# Patient Record
Sex: Male | Born: 1940 | Race: White | Hispanic: No | Smoking: Former smoker
Health system: Southern US, Community
[De-identification: ages and names within clinical notes are randomized; demographics above are authoritative.]

## PROBLEM LIST (undated history)

## (undated) DIAGNOSIS — H269 Unspecified cataract: Secondary | ICD-10-CM

## (undated) DIAGNOSIS — I4891 Unspecified atrial fibrillation: Secondary | ICD-10-CM

## (undated) DIAGNOSIS — M1990 Unspecified osteoarthritis, unspecified site: Secondary | ICD-10-CM

## (undated) DIAGNOSIS — F419 Anxiety disorder, unspecified: Secondary | ICD-10-CM

## (undated) DIAGNOSIS — J449 Chronic obstructive pulmonary disease, unspecified: Secondary | ICD-10-CM

## (undated) DIAGNOSIS — J45909 Unspecified asthma, uncomplicated: Secondary | ICD-10-CM

## (undated) DIAGNOSIS — Z8709 Personal history of other diseases of the respiratory system: Secondary | ICD-10-CM

## (undated) DIAGNOSIS — J302 Other seasonal allergic rhinitis: Secondary | ICD-10-CM

## (undated) DIAGNOSIS — I509 Heart failure, unspecified: Secondary | ICD-10-CM

## (undated) DIAGNOSIS — Z8719 Personal history of other diseases of the digestive system: Secondary | ICD-10-CM

## (undated) DIAGNOSIS — Z972 Presence of dental prosthetic device (complete) (partial): Secondary | ICD-10-CM

## (undated) DIAGNOSIS — E785 Hyperlipidemia, unspecified: Secondary | ICD-10-CM

## (undated) DIAGNOSIS — I10 Essential (primary) hypertension: Secondary | ICD-10-CM

## (undated) DIAGNOSIS — E039 Hypothyroidism, unspecified: Secondary | ICD-10-CM

## (undated) DIAGNOSIS — Z8711 Personal history of peptic ulcer disease: Secondary | ICD-10-CM

## (undated) HISTORY — DX: Hyperlipidemia, unspecified: E78.5

## (undated) HISTORY — PX: CARDIAC CATHETERIZATION: SHX172

## (undated) HISTORY — DX: Other seasonal allergic rhinitis: J30.2

## (undated) HISTORY — DX: Chronic obstructive pulmonary disease, unspecified: J44.9

## (undated) HISTORY — PX: CHOLECYSTECTOMY: SHX55

## (undated) HISTORY — PX: COLONOSCOPY: SHX174

## (undated) HISTORY — PX: JOINT REPLACEMENT: SHX530

## (undated) HISTORY — PX: ESOPHAGOGASTRODUODENOSCOPY: SHX1529

## (undated) HISTORY — PX: HERNIA REPAIR: SHX51

---

## 2005-10-26 ENCOUNTER — Ambulatory Visit (HOSPITAL_COMMUNITY): Admission: RE | Admit: 2005-10-26 | Discharge: 2005-10-26 | Payer: Self-pay | Admitting: Cardiology

## 2006-11-12 ENCOUNTER — Emergency Department: Payer: Self-pay | Admitting: Emergency Medicine

## 2006-11-12 ENCOUNTER — Other Ambulatory Visit: Payer: Self-pay

## 2006-12-24 ENCOUNTER — Ambulatory Visit: Payer: Self-pay | Admitting: Family Medicine

## 2006-12-24 ENCOUNTER — Inpatient Hospital Stay (HOSPITAL_COMMUNITY): Admission: EM | Admit: 2006-12-24 | Discharge: 2006-12-25 | Payer: Self-pay | Admitting: Emergency Medicine

## 2007-01-16 ENCOUNTER — Ambulatory Visit: Payer: Self-pay | Admitting: Family Medicine

## 2007-02-11 ENCOUNTER — Other Ambulatory Visit: Payer: Self-pay

## 2007-02-12 ENCOUNTER — Inpatient Hospital Stay: Payer: Self-pay | Admitting: Internal Medicine

## 2007-03-10 ENCOUNTER — Other Ambulatory Visit: Payer: Self-pay

## 2007-03-11 ENCOUNTER — Inpatient Hospital Stay: Payer: Self-pay | Admitting: Internal Medicine

## 2007-04-27 ENCOUNTER — Other Ambulatory Visit: Payer: Self-pay

## 2007-04-28 ENCOUNTER — Observation Stay: Payer: Self-pay | Admitting: Internal Medicine

## 2007-05-06 ENCOUNTER — Inpatient Hospital Stay (HOSPITAL_COMMUNITY): Admission: RE | Admit: 2007-05-06 | Discharge: 2007-05-09 | Payer: Self-pay | Admitting: Orthopedic Surgery

## 2007-05-09 ENCOUNTER — Encounter: Payer: Self-pay | Admitting: Internal Medicine

## 2007-10-21 ENCOUNTER — Inpatient Hospital Stay (HOSPITAL_COMMUNITY): Admission: RE | Admit: 2007-10-21 | Discharge: 2007-10-24 | Payer: Self-pay | Admitting: Orthopedic Surgery

## 2007-10-22 ENCOUNTER — Ambulatory Visit: Payer: Self-pay | Admitting: Surgery

## 2007-10-22 ENCOUNTER — Encounter (INDEPENDENT_AMBULATORY_CARE_PROVIDER_SITE_OTHER): Payer: Self-pay | Admitting: Orthopedic Surgery

## 2010-01-20 ENCOUNTER — Encounter (INDEPENDENT_AMBULATORY_CARE_PROVIDER_SITE_OTHER): Payer: Self-pay | Admitting: *Deleted

## 2010-01-24 ENCOUNTER — Telehealth: Payer: Self-pay | Admitting: Gastroenterology

## 2010-02-14 ENCOUNTER — Encounter (INDEPENDENT_AMBULATORY_CARE_PROVIDER_SITE_OTHER): Payer: Self-pay | Admitting: *Deleted

## 2010-02-16 ENCOUNTER — Ambulatory Visit: Payer: Self-pay | Admitting: Gastroenterology

## 2010-03-03 ENCOUNTER — Ambulatory Visit: Payer: Self-pay | Admitting: Gastroenterology

## 2010-05-22 ENCOUNTER — Encounter: Payer: Self-pay | Admitting: Orthopedic Surgery

## 2010-05-31 NOTE — Letter (Signed)
Summary: [facility] Instructions  [facility]  [address] [place], [place] [address]   Phone: [phone]  Fax: [phone]       JOSHA WEEKLEY    March 27, 1941    MRN: [id number]        Procedure Day /Date:  Thursday 03/03/2010     Arrival Time: 9:00 am      Procedure Time: 10:00 am     Location of Procedure:                    _x _  [facility] (4th Floor)                        PREPARATION FOR COLONOSCOPY WITH MOVIPREP   Starting 5 days prior to your procedure Saturday 10/29 do not eat nuts, seeds, popcorn, corn, beans, peas,  salads, or any raw vegetables.  Do not take any fiber supplements (e.g. Metamucil, Citrucel, and Benefiber).  THE DAY BEFORE YOUR PROCEDURE         DATE: Wednesday 11/2  1.  Drink clear liquids the entire day-NO SOLID FOOD  2.  Do not drink anything colored red or purple.  Avoid juices with pulp.  No orange juice.  3.  Drink at least 64 oz. (8 glasses) of fluid/clear liquids during the day to prevent dehydration and help the prep work efficiently.  CLEAR LIQUIDS INCLUDE: Water Jello Ice Popsicles Tea (sugar ok, no milk/cream) Powdered fruit flavored drinks Coffee (sugar ok, no milk/cream) Gatorade Juice: apple, white grape, white cranberry  Lemonade Clear bullion, consomm, broth Carbonated beverages (any kind) Strained chicken noodle soup Hard Candy                             4.  In the morning, mix first dose of MoviPrep solution:    Empty 1 Pouch A and 1 Pouch B into the disposable container    Add lukewarm drinking water to the top line of the container. Mix to dissolve    Refrigerate (mixed solution should be used within 24 hrs)  5.  Begin drinking the prep at 5:00 p.m. The MoviPrep container is divided by 4 marks.   Every 15 minutes drink the solution down to the next mark (approximately 8 oz) until the full liter is complete.   6.  Follow completed prep with 16 oz of clear liquid of your choice  (Nothing red or purple).  Continue to drink clear liquids until bedtime.  7.  Before going to bed, mix second dose of MoviPrep solution:    Empty 1 Pouch A and 1 Pouch B into the disposable container    Add lukewarm drinking water to the top line of the container. Mix to dissolve    Refrigerate  THE DAY OF YOUR PROCEDURE      DATE: Thursday 11/3  Beginning at 5:00 a.m. (5 hours before procedure):         1. Every 15 minutes, drink the solution down to the next mark (approx 8 oz) until the full liter is complete.  2. Follow completed prep with 16 oz. of clear liquid of your choice.    3. You may drink clear liquids until 8:00 am (2 HOURS BEFORE PROCEDURE).   MEDICATION INSTRUCTIONS  Unless otherwise instructed, you should take regular prescription medications with a small sip of water   as early as possible the morning of  your procedure.           OTHER INSTRUCTIONS  You will need a responsible adult at least 68 years of age to accompany you and drive you home.   This person must remain in the waiting room during your procedure.  Wear loose fitting clothing that is easily removed.  Leave jewelry and other valuables at home.  However, you may wish to bring a book to read or  an iPod/MP3 player to listen to music as you wait for your procedure to start.  Remove all body piercing jewelry and leave at home.  Total time from sign-in until discharge is approximately 2-3 hours.  You should go home directly after your procedure and rest.  You can resume normal activities the  day after your procedure.  The day of your procedure you should not:   Drive   Make legal decisions   Operate machinery   Drink alcohol   Return to work  You will receive specific instructions about eating, activities and medications before you leave.    The above instructions have been reviewed and explained to me by   Clide Cliff, RN______________________    I fully understand  and can verbalize these instructions _____________________________ Date _________

## 2010-05-31 NOTE — Miscellaneous (Signed)
Summary: direct colon--ch./previsit  Clinical Lists Changes  Medications: Added new medication of MOVIPREP 100 GM  SOLR (PEG-KCL-NACL-NASULF-NA ASC-C) As directed - Signed Rx of MOVIPREP 100 GM  SOLR (PEG-KCL-NACL-NASULF-NA ASC-C) As directed;  #1 x 0;  Signed;  Entered by: Clide Cliff RN;  Authorized by: Louis Meckel MD;  Method used: Electronically to [facility] [address], [place], [place]  [address], Ph: [phone], Fax: [phone] Observations: Added new observation of ALLERGY REV: Done (02/16/2010 13:40)    Prescriptions: MOVIPREP 100 GM  SOLR (PEG-KCL-NACL-NASULF-NA ASC-C) As directed  #1 x 0   Entered by:   Clide Cliff RN   Authorized by:   Louis Meckel MD   Signed by:   Clide Cliff RN on 02/16/2010   Method used:   Electronically to        [facility] [address]* (retail)       [address]       [place], [place]  [address]       Ph: [phone]       Fax: [phone]   RxID:   [id number]

## 2010-05-31 NOTE — Letter (Signed)
Summary: Pre Visit Letter Revised  [facility]  [address] [place], [place] [address]   Phone: [phone]  Fax: [phone]        01/20/2010 MRN: [id number] [name] [address] [place], [place]  [address]             Procedure Date:  03-03-10   Welcome to the Gastroenterology Division at [facility].    You are scheduled to see a nurse for your pre-procedure visit on 02-16-10 at 2:00p.m. on the 3rd floor at [facility], [address]  We ask that you try to arrive at our office 15 minutes prior to your appointment time to allow for check-in.  Please take a minute to review the attached form.  If you answer "Yes" to one or more of the questions on the first page, we ask that you call the person listed at your earliest opportunity.  If you answer "No" to all of the questions, please complete the rest of the form and bring it to your appointment.    Your nurse visit will consist of discussing your medical and surgical history, your immediate family medical history, and your medications.   If you are unable to list all of your medications on the form, please bring the medication bottles to your appointment and we will list them.  We will need to be aware of both prescribed and over the counter drugs.  We will need to know exact dosage information as well.    Please be prepared to read and sign documents such as consent forms, a financial agreement, and acknowledgement forms.  If necessary, and with your consent, a friend or relative is welcome to sit-in on the nurse visit with you.  Please bring your insurance card so that we may make a copy of it.  If your insurance requires a referral to see a specialist, please bring your referral form from your primary care physician.  No co-pay is required for this nurse visit.     If you cannot keep your appointment, please call [phone] to cancel or reschedule prior to your appointment date.  This allows  [place] the opportunity to schedule an appointment for another patient in need of care.    Thank you for choosing  Gastroenterology for your medical needs.  We appreciate the opportunity to care for you.  Please visit [place] at our website  to learn more about our practice.  Sincerely, The Gastroenterology Division

## 2010-05-31 NOTE — Progress Notes (Signed)
Summary: Previous colon 4 years ago Direct referral form  Phone Note Call from Patient Call back at Work Phone [phone]   Caller: Patient Call For: Dr. Arlyce Dice Reason for Call: Talk to Nurse Summary of Call: has procedure questions Initial call taken by: Vallarie Mare,  January 24, 2010 3:09 PM  Follow-up for Phone Call        Pt called explaining that he has had a colonoscopy 4 years ago; he had 2 polyps that were benign. Explained to pt that we have to review his old colonoscopy. Pt states he will bring [place] a copy as soon as he finds them. Lennie Peters,MD was the provider. Follow-up by: Merri Ray CMA Duncan Dull),  January 24, 2010 3:27 PM  Additional Follow-up for Phone Call Additional follow up Details #1::        Tried to contact pt again about previous colonoscopy report Additional Follow-up by: Merri Ray CMA Duncan Dull),  February 01, 2010 10:30 AM    Additional Follow-up for Phone Call Additional follow up Details #2::    Dr Arlyce Dice, I have been waiting on pt to bring in a copy of his old colon report from 4 years ago. He has not brought it in yet. Can we still go ahead and leave pt as scheduled for his pre-visit and colonoscopy which is scheduled o for november?? Follow-up by: Merri Ray CMA Duncan Dull),  February 03, 2010 10:29 AM  Additional Follow-up for Phone Call Additional follow up Details #3:: Details for Additional Follow-up Action Taken: yes    Called pt to inform to continue with the scheduled pre-visit and colon dates. L/M for him to contact the office if he has any questions Additional Follow-up by: Louis Meckel MD,  February 03, 2010 11:59 AM   Appended Document: Previous colon 4 years ago Direct referral form Pt called he is aware to keep all appointments as scheduled, could not find his old reports

## 2010-05-31 NOTE — Procedures (Signed)
Summary: Colonoscopy  Patient: [name] Note: All result statuses are Final unless otherwise noted.  Tests: (1) Colonoscopy (COL)   COL Colonoscopy           DONE     [facility]     [address]     [place], [place]  [address]           COLONOSCOPY PROCEDURE REPORT           PATIENT:  Mark, Ellis  MR#:  [id number]     BIRTHDATE:  04/29/1941, 69 yrs. old  GENDER:  male           ENDOSCOPIST:  Barbette Hair. Arlyce Dice, MD     Referred by:  Rinaldo Cloud, M.D.           PROCEDURE DATE:  03/03/2010     PROCEDURE:  Diagnostic Colonoscopy     ASA CLASS:  Class II     INDICATIONS:  1) screening  2) history of pre-cancerous     (adenomatous) colon polyps Last colo 5 years ago - polyp           MEDICATIONS:   Fentanyl 75 mcg IV, Versed 6 mg IV           DESCRIPTION OF PROCEDURE:   After the risks benefits and     alternatives of the procedure were thoroughly explained, informed     consent was obtained.  Digital rectal exam was performed and     revealed no abnormalities.   The LB 180AL [id number] endoscope was     introduced through the anus and advanced to the cecum, which was     identified by both the appendix and ileocecal valve, without     limitations.  The quality of the prep was excellent, using     MoviPrep.  The instrument was then slowly withdrawn as the colon     was fully examined.     <<PROCEDUREIMAGES>>           FINDINGS:  Moderate diverticulosis was found in the sigmoid colon     (see image13).  This was otherwise a normal examination of the     colon (see image1, image2, image4, image6, image8, image14,     image15, and image16).   Retroflexed views in the rectum revealed     no abnormalities.    The time to cecum =  4.0  minutes. The scope     was then withdrawn (time =  6.50  min) from the patient and the     procedure completed.           COMPLICATIONS:  None           ENDOSCOPIC IMPRESSION:     1) Moderate diverticulosis in the sigmoid  colon     2) Otherwise normal examination     RECOMMENDATIONS:     1) Colonoscopy in 10 years           REPEAT EXAM:   10 year(s) Colonoscopy           ______________________________     Barbette Hair. Arlyce Dice, MD           CC:           n.     eSIGNED:   Barbette Hair. Kaplan at 03/03/2010 10:41 AM           Lillard Anes, [id number]  Note: An exclamation mark (!) indicates a result that was not  dispersed into the flowsheet. Document Creation Date: 03/03/2010 10:42 AM _______________________________________________________________________  (1) Order result status: Final Collection or observation date-time: 03/03/2010 10:33 Requested date-time:  Receipt date-time:  Reported date-time:  Referring Physician:   Ordering Physician: Melvia Heaps [phone]) Specimen Source:  Source: Launa Grill Order Number: [id number] Lab site:   Appended Document: Colonoscopy    Clinical Lists Changes  Observations: Added new observation of COLONNXTDUE: 03/2020 (03/03/2010 14:32)

## 2010-09-13 NOTE — H&P (Signed)
[name], ADAMEC             ACCOUNT NO.:  [id number]   MEDICAL RECORD NO.:  [id number]          PATIENT TYPE:  INP   LOCATION:  5731                         FACILITY:  [facility]   PHYSICIAN:  Pearlean Brownie, M.D.DATE OF BIRTH:  Mar 17, 1941   DATE OF ADMISSION:  12/23/2006  DATE OF DISCHARGE:                              HISTORY & PHYSICAL   CHIEF COMPLAINT:  Shortness of breath.   PRIMARY CARE PHYSICIAN:  A physician in [place].   HISTORY OF PRESENT ILLNESS:  This is a 70 year old male with shortness  of breath.  He was diagnosed with bronchitis about a year ago and  since then has had lung problems.  In the past he has been given Xopenex  and Spiriva after the initial diagnosis.  He does have a pulmonologist  who says his lungs are worn out and that he has bronchitis in the lower  one-third of his lungs.  He has also been given Advair and Levaquin and  Prednisone in July which helped temporarily.  He has also been given  Combivent in the past which he uses when he wants to.   Today he went to the physician for increased shortness of breath and was  started on Levaquin.  He had shortness of breath afterwards and at home.  He felt he could not catch his breath after being at home and wanted to  come in for further evaluation.  He complains of a dry cough over the  past number of months.  He complains of congestion.  He denies fever, he  denies chest pain.  He does not produce sputum with his cough.  He says  his blood work has been normal in the past.  The patient does feel  better after the use of inhalers.  However, he does not use these  inhalers as directed.  In the emergency room albuterol nebulizers have  helped.  The patient says he has not slept in two days because of  shortness of breath.  At the end of the interview, he mentions that he  has 2 liters of oxygen at home that he is supposed to use.  No other  complaints.   PAST MEDICAL HISTORY:  1. Hypertension.  2. Hyperlipidemia.  3. Anxiety.  4. Sleep apnea.  5. A catheterization in 2007 which showed an EF of 50% and global      hypokinesia; however, no coronary artery disease.  6. History of an upper GI bleeding ulcer.   MEDICATIONS:  1. Levaquin.  2. Xopenex every 4-6 hours.  3. Combivent.  4. CPAP which he is not using.  5. Paxil 10 mg daily.  6. Lisinopril 40 mg daily.  7. Crestor 10 mg daily.  8. Aspirin 81 mg.   ALLERGIES:  No known drug allergies.   PAST SURGICAL HISTORY:  1. Sinus surgeries, multiple.  2. Hernia surgery.  3. Cholecystectomy.  4. GI surgery for a bleeding ulcer.   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  He is not a smoker; however, has a  remote  smoking history of no much daily.  However, he does say he was around a  lot of smoke because he was in a band.  Denies alcohol.  Denies drug  use.   FAMILY HISTORY:  Father had MI at age 58.  There is no lung disease in  the family.  Mother had a stroke.  The patient lives with his wife.   REVIEW OF SYSTEMS:  See History of the Present Illness as well as denies  constipation, denies diarrhea.  Endorses weight loss of 5-7 pounds over  2-3 months.  Denies sweats.   PHYSICAL EXAMINATION:  VITAL SIGNS:  Temperature 97.9, heart rate 102-  106, blood pressure 157/96, O2 97% on room air, respiratory rate 20.  GENERAL APPEARANCE:  Sleeping, but arousable.  HEENT:  Pupils equal, round and reactive to light and accommodation.  Extraocular muscles are intact.  Dry mucous membranes with cracked lips.  No erythema of the throat.  NECK:  No lymphadenopathy.  CARDIOVASCULAR:  Tachycardic.  Regular rhythm.  No rubs, gallops or  murmurs.  PULMONARY:  Positive expiratory wheezes at the bases.  Fair air  movement.  No increased work of breathing.  The patient is able to  complete full sentences.  ABDOMEN:  Obese.  Positive bowel sounds.  Nontender.  SKIN:  No  rashes.  NEUROLOGIC:  Cranial nerves II-XII are intact.  Cerebellar function is  intact.  MUSCULOSKELETAL:  With 5/5 strength in the upper and lower extremities  bilaterally.  PSYCHIATRIC:  The patient continued to have pressured speech and it was  very difficult to obtain a history from this patient.  EXTREMITIES:  No edema.  Nontender, with 2+ pulses.   LABORATORY INVESTIGATIONS:  Sodium 135, potassium 3.8, chloride 98,  bicarbonate 28, BUN 7, creatinine 0.73, glucose 117, calcium 9.6.  White  blood cells 7, hemoglobin 14.4, hematocrit 42.2, platelets 259,000, ANC  4.3, neutrophil count 62%.  Point of cares are negative.  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   Chest x-ray:  COPD, no acute findings.  BNP is 33.   ASSESSMENT AND PLAN:  This is a 70 year old male with chronic  obstructive pulmonary disease.   1. Chronic obstructive pulmonary disease:  Mild exacerbation.  This      patient likely has moderate to severe COPD that has been      inadequately treated secondary to poor understanding by the patient      of treatment and diagnosis.  For now will start Avelox; however, we      may discontinue this as there is no productive sputum.  However,      the patient was started on Levaquin in the outpatient setting and      was told that he had bronchitis.  Prednisone daily.  Albuterol and      Atrovent nebulizers.  O2.  The patient has a pulmonologist who      seems to have been treating COPD.  We will hold off further work-up      at this point.  The patient states he has O2 at home but does not      always use it.  Will provide the patient with education while here.  2. Hypertension:  Home lisinopril.  3. Tachycardia:  Likely secondary to nebulizers.  Will follow.  4. Anxiety:  Paxil.  5. Hyperlipidemia:  Crestor.  6. Sleep apnea:  The patient states he does not use or want to  use      CPAP.  Will hold off for now.   DISPOSITION:  Pending  pulmonary improvement.      Johney Maine, M.D.  Electronically Signed      Pearlean Brownie, M.D.  Electronically Signed    JT/MEDQ  D:  12/24/2006  T:  12/24/2006  Job:  [id number]

## 2010-09-13 NOTE — Op Note (Signed)
[name], [name]             ACCOUNT NO.:  [id number]   MEDICAL RECORD NO.:  [id number]          PATIENT TYPE:  INP   LOCATION:  2550                         FACILITY:  [facility]   PHYSICIAN:  Mila Homer. Sherlean Foot, M.D. DATE OF BIRTH:  10/13/1940   DATE OF PROCEDURE:  05/06/2007  DATE OF DISCHARGE:                               OPERATIVE REPORT   SURGEON:  Mila Homer. Sherlean Foot, M.D.   ASSISTANT:  Arnoldo Morale, PA   ANESTHESIA:  General.   PREOPERATIVE DIAGNOSIS:  Right knee osteoarthritis.   POSTOPERATIVE DIAGNOSIS:  Right knee osteoarthritis.   PROCEDURE:  Right total knee arthroplasty.   INDICATIONS FOR PROCEDURE:  The patient is a 70 year old white male with  failure of conservative measures for osteoarthritis of the right knee.  Informed consent was obtained.   DESCRIPTION OF PROCEDURE:  The patient laid supine and administered  general anesthesia.  Foley catheter placed.  Right leg was prepped and  draped in sterile fashion.  The extremity was exsanguinated with the  Esmarch and tourniquet inflated 350 mmHg and set for an hour.  A midline  incision made a #10 blade.  New blade was used to make a median  parapatellar arthrotomy and perform synovectomy.  I elevated deep MCL  off the medial crest of the tibia and around to the semimembranosus  tendon.  This was a varus knee.  Then went into flexion, subluxed the  patella.  Measured the patella at 25 mm thick, reamed down 9 mm.  I used  35 mm template, drilled three lug holes and with prosthetic trial in  place recreated the 25-mm thickness.  I then used the extramedullary  alignment system on the tibia to make a perpendicular cut to the  anatomic axis of the tibia.  I then made an intramedullary drill on the  distal femur.  I placed intramedullary guide set on 6 degrees valgus  cut, made distal femoral cut with sagittal saw.  Then marked out the  epicondylar axis in Whiteside's line.  Used multi referencing technique  to  externally rotate the femur 3 degrees, sized to a size F.  I put a 4  in 1 cutting block into place made anterior-posterior chamfer cuts.  I  then placed a lamina spreader in the knee, removed the medial and  lateral menisci, posterior condylar osteophytes, ACL and PCL.  I then  placed 12 mm spacer block in the knee had good flexion/extension gap  balance.  Then finished the femur with a size F finishing block,  finished the tibia with a size 7 tibial tray, drill and keel.  Then  trialed with 7 tibia, F femur, 10 and 12 inserts, 35-mm patella, a 12-mm  insert gave full extension, drop and dangle back to 130 degrees.  Ligaments stable, patella tracked well.  I then removed the trial  components, copiously irrigated.  I then cemented in components and  removed excess cement and allowed the cement to harden in extension.  I  placed a Hemovac coming out deep to the arthrotomy and superolaterally.  Left a pain catheter coming out supermedial and superficial  to the  arthrotomy.  Once cement was hard, I let tourniquet down, obtained  hemostasis and copiously irrigated once again.  I then closed the  arthrotomy with figure-of-eight #1 Vicryl sutures, the deep soft tissues  with buried 0 Vicryl sutures, subcuticular 2-0 Vicryl stitch and skin  staples.  Dressed with Xeroform, dressing sponges, sterile Webril and  TED stocking.   COMPLICATIONS:  None.   DRAINS:  One Hemovac, one pain catheter.   ESTIMATED BLOOD LOSS:  300 mL.   TOURNIQUET TIME:  53 minutes.           ______________________________  Mila Homer Sherlean Foot, M.D.     SDL/MEDQ  D:  05/06/2007  T:  05/06/2007  Job:  [id number]

## 2010-09-13 NOTE — Op Note (Signed)
[name], [name]             ACCOUNT NO.:  [id number]   MEDICAL RECORD NO.:  [id number]          PATIENT TYPE:  INP   LOCATION:  5032                         FACILITY:  [facility]   PHYSICIAN:  Mila Homer. Sherlean Foot, M.D. DATE OF BIRTH:  [name], [name]   DATE OF PROCEDURE:  10/21/2007  DATE OF DISCHARGE:                               OPERATIVE REPORT   SURGEON:  Mila Homer. Sherlean Foot, MD   ASSISTANT:  Skip Mayer, PA   ANESTHESIA:  General.   PREOPERATIVE DIAGNOSIS:  Left knee osteoarthritis.   POSTOPERATIVE DIAGNOSIS:  Left knee osteoarthritis.   PROCEDURE:  Left total knee arthroplasty.   INDICATIONS FOR PROCEDURE:  The patient is a 70 year old white male,  failed conservative measures for osteoarthritis of left knee.  He had an  excellent outcome from a right total knee previously.  Informed consent  was obtained.   DESCRIPTION OF PROCEDURE:  The patient was laid supine, administered  general anesthesia.  Foley catheter was placed.  Left leg was prepped  and draped in usual sterile fashion.  The leg was then exsanguinated  with the Esmarch and tourniquet inflated to 350 mmHg.  Then, I made a  midline incision approximately 6 inches in length with a clean #10  blade.  New blade was used to make a median parapatellar arthrotomy to  perform a synovectomy.  I everted the patella measured 25 mm thick.  I  reamed down 9 mm, drilled 3 lug holes through the 35-mm template and I  had recreated 22-mm thickness with prosthetic trial.  I then went into  flexion.  Used extramedullary alignment system on the tibia to make a  perpendicular cut to the anatomic axis of the tibia.  I used the  intramedullary system on the femur to make a 6-degree valgus cut on the  distal femur.  I then drew out the epicondylar axis, posterior condylar  angle measured 3 degrees.  I sized to a size F and pinned the formal  cutting block in 3 degrees of external rotation.  I made the anterior-  posterior chamfer cuts  with sagittal saw.  I then placed a lamina  spreader in the knee, removed the ACL, PCL, medial lateral menisci, and  posterior condylar osteophytes.  I then obtained ligament balance.  I  then finished the femur with a size 7 tibial finishing block cutting  through the lugs and the box.  I then finished the tibia with a size 7  tibial tray drilling keel.  I then trialed with a size 7 tibia, size 12  insert, size 7 femur, and 35 patella; had good flexion/extension gap  balance and good patella tracking.  I chose these components.  I removed  the trials and copiously irrigated.  Then cemented in the components and  removed excess cement allowing the cement to harden in extension.  I  placed a Hemovac coming out superolaterally and deep to the arthrotomy.  Pain catheter coming out superomedially and superficially to the  arthrotomy.  I then let the tourniquet down to obtain hemostasis.  I  then copiously irrigated.  I then closed  the arthrotomy with figure-of-  eight #1 Vicryl sutures.  Deep soft tissues buried with 0-Vicryl  sutures, subcuticular with 2-0 Vicryl stitch and skin staples.  Dressed  with Xeroform dressing sponges, sterile Webril, and TED stocking.   COMPLICATIONS:  None.   DRAINS:  One Hemovac and one pain catheter.   EBL:  300 mL.   TOURNIQUET TIME:  59 minutes.           ______________________________  Mila Homer Sherlean Foot, M.D.     SDL/MEDQ  D:  10/21/2007  T:  10/22/2007  Job:  [id number]

## 2010-09-13 NOTE — Discharge Summary (Signed)
[name], [name]             ACCOUNT NO.:  [id number]   MEDICAL RECORD NO.:  [id number]          PATIENT TYPE:  INP   LOCATION:  5038                         FACILITY:  [facility]   PHYSICIAN:  Mila Homer. Sherlean Foot, M.D. DATE OF BIRTH:  1940/08/24   DATE OF ADMISSION:  05/06/2007  DATE OF DISCHARGE:                               DISCHARGE SUMMARY   ADMISSION DIAGNOSIS:  End stage osteoarthritis, right knee.   DISCHARGE DIAGNOSES:  1. End stage osteoarthritis, right knee.  2. Chronic obstructive pulmonary disease.  3. Hypertension.  4. Anxiety.  5. History of gastrointestinal bleed.  6. Coronary artery disease.   PROCEDURE:  Right total knee arthroplasty.   HISTORY:  This 70 year old white male with right knee pain for 10 years.  He has had no injury or surgeries.  He does have giving away.  Not  really much in the way of waking pain.  He is not using any assistive  devices.  He has had injections of corticosteroids which have been  somewhat effective at times.  At this time, he has not had much relief  with corticosteroids.  He is bone-on-bone medially as well as  patellofemoral compartments.  He is admitted at this time for total knee  arthroplasty.   HOSPITAL COURSE:  This 70 year old white male was admitted on May 06, 2007, after appropriate laboratory studies were obtained as well as 1  gram Ancef IV on call to the operating room.  He was taken to the  operating room where he underwent a right total knee arthroplasty.  This  was done by Dr. Georgena Spurling, assisted by  Legrand Pitts. Duffy, P.A.  He  tolerated the procedure well.  He was placed on Lovenox 30 mg  subcutaneous q.12 h.  Ancef 1 gram IV q.6 h. x3 was continued.  PCA pump  with Dilaudid was used.  Consult with PT, OT, care management.  Ambulate  weightbearing as tolerated.  He was allowed out of the bed to a chair  the following day.  He had his dressings changed on the 7th.  His wound  remained intact.  He was on  prednisone 30 mg p.o. q.a.m. and this was  decreased to 20 mg p.o. q.a.m. on the 6t.  He has done well.  He is  ambulating well and is now indicated for discharge to a skilled nursing  facility.   RADIOGRAPHIC STUDIES:  Chest x-ray of April 27, 2007, revealed no  acute cardiopulmonary disease.  COPD changes were noted.  EKG revealed  sinus rhythm with premature atrial complexes, premature ventricular  complexes, and perfusion complexes.  Left anterior fascicular block.   LABORATORY STUDIES:  Admitted with a hemoglobin of 15, hematocrit 45,  white count 8,400, platelets 301,000.  Discharge hemoglobin 9.7,  hematocrit 28, white count 10,000, platelets 204,000.  Admission  electrolytes revealed sodium 136, potassium 3.3, chloride 103, CO2 26,  glucose 86, BUN 9, creatinine 0.73.  Discharge sodium 132, potassium  3.6, chloride 101, CO2 27, glucose 181, BUN 8, creatinine 0.82.  AST 19,  ALT 29, alk phos 60, total bilirubin 0.8, and  albumin was 4.1.  Calcium  at discharge 8.  Urinalysis revealed benign for a voided urine.  Microscopic exam was done.   DISCHARGE INSTRUCTIONS:  1. He may increase his activity slowly.  Use his walker for      ambulation.  Weightbearing as tolerated.  No driving or lifting for      6 weeks.  CPM machine 0-90 degrees at 6-8 hours per day.  2. He is to follow the blue instruction sheet.  3. Call for an appointment with Dr. Sherlean Foot for May 21, 2007.  He      will need to call [phone] to make this appointment.  4. He is to keep the incision clean and dry.  Change the dressing      daily.  5. TED hose to the operative leg when ambulating, may remove at      nighttime as long as the bandage is secured to the leg.   MEDICATIONS:  1. Lisinopril 40 mg daily.  2. Crestor 10 mg daily.  3. Zyflo CR 600 mg two tablets twice a day.  4. Xanax 0.5 mg twice a day.  5. Prednisone 20 mg q.a.m. and this may be tapered by the physician at      the nursing facility.  6.  Spiriva 18 mcg daily.  7. Xopenex 0.125 nebulizer 4-5 times a day.  8. Multivitamin once daily.  9. Lovenox 40 mg inject at 8 a.m. daily, last done on May 20, 2007.  10.Resume aspirin on May 21, 2007.   He was discharged in improved condition.   Follow back up 2 weeks.      Oris Drone Petrarca, P.A.-C.    ______________________________  Mila Homer. Sherlean Foot, M.D.    BDP/MEDQ  D:  05/09/2007  T:  05/09/2007  Job:  [id number]

## 2010-09-16 NOTE — Discharge Summary (Signed)
NAMESAWYER, MENTZER             ACCOUNT NO.:  [id number]   MEDICAL RECORD NO.:  [id number]          PATIENT TYPE:  INP   LOCATION:  5032                         FACILITY:  [facility]   PHYSICIAN:  Mila Homer. Sherlean Foot, M.D. DATE OF BIRTH:  March 04, 1941   DATE OF ADMISSION:  10/21/2007  DATE OF DISCHARGE:  10/24/2007                               DISCHARGE SUMMARY   FINAL DIAGNOSIS:  For this admission is end-stage degenerative joint  disease of the left knee.   PROCEDURE:  While in hospital, left total knee arthroplasty.   HISTORY OF PRESENT ILLNESS:  The patient is a 70 year old man with many  year history of left knee pain.  He underwent a right total knee  arthroplasty on May 06, 2007, no postoperative complication, was well  pleased with results.  Pain in left knee now interferes with sleep,  activities of daily living, and safe ambulation.  He has failed  conservative treatment and wishes to proceed with left total knee  arthroplasty after discussing again the risks versus benefits of the  procedure.  He has no known drug allergies.   MEDICAL HISTORY:  Significant for osteoarthritis, COPD, asthma,  hypertension, hyperlipidemia, and sleep apnea.   MEDICATIONS:  At time of admission, lisinopril, Crestor, aspirin, Xanax,  Advair, Spiriva, and  Xopenex.   SURGICAL HISTORY:  Right total knee arthroplasty in 2009, hernia repair  in 2003, hernia repair in 1993, gallbladder removed in 1998, hernia  repair in 1973, and hernia repair in 1980.  No difficulties with any of  the anesthesia.  The patient previously smoked, but stopped in 1965, he  does not use alcohol.  He is married.   FAMILY HISTORY:  Positive for mother died at age 48 with history of  hypertension, diabetes, and CVA.  Father died at age 35 with heart  attack.   REVIEW OF SYSTEMS:  Positive for glasses, dentures, history of shortness  of breath with exertion, bronchitis, and COPD, hypertension, sleep  apnea, and nervous  tension.   PHYSICAL EXAM:  The patient's temperature is 98.3, pulse 70,  respirations 16, blood pressure 120/78.  He is 6 feet 2, 110 pounds  male.  Head is normocephalic, atraumatic.  Ears, TMs clear.  Eyes,  pupils equal, round and reactive to light and accommodation.  Mouth and  throat is benign.  Neck is supple.  Full range of motion.  Chest is  clear to auscultation and percussion.  Cardiac shows regular rate and  rhythm.  Abdomen, soft, nontender.  Patient neurovascularly intact.  Skin shows well-healed scar from the right total knee.  No broken skin  over the potential site on the left.  Range of motion of left knee is 5-  115 degrees, positive for crepitus and pain.  He has stable ligaments.  No obvious effusion.  Preoperative labs including CBC, CMP, chest x-ray,  EKG, PT, and PTT were all within normal limits with exception of a chest  film, which showed nodules in the right base and in the right  paratracheal, apparently he has had these tested before but is made  aware of them  again.  Other labs are within normal limits.   HOSPITAL COURSE:  On the day of admission, the patient was taken to the  operating room at [facility] where he underwent a left total knee  arthroplasty by Dr. Valentina Gu with Zimmer component size 8 left femur, size 7  stem tibia, Zimmer NexGen, 35-mm patella, and a 12-mm bearing articular  surface was used.  All components cemented.  The patient was placed on  perioperative antibiotics.  He was placed on postoperative Lovenox  prophylaxis as long as well as mechanical means to decrease the risk of  DVT.  His physical therapy was begun in the PACU using CPM as well  physical therapy.  He was placed on PCA for pain control using Dilaudid  per pharmacy protocol.  Postoperative day 1, the patient had minimal  pain, hemoglobin 11.0, WBC 4.4, temperature 98.6, pulse somewhat  increased to 101.  Other vitals stable.  Alert and oriented x3.  Neurovascularly intact.   Range of motion 0-70 degrees with pain, had  actually achieved 0-90 on the CPM.  Because of calf pain found at the  time of his exam, he was sent for venous Dopplers to make sure that  there was no evidence of DVT, this was indeed negative.  PCA was  discontinued on first postoperative day and physical therapy continued.  Postoperative day 2, the patient's pain had diminished particularly in  the calf.  Temperature was normal 98.8, pulses had decreased to 88.  Hemoglobin 11.0, WBC 5.9 , otherwise stable.  Marcaine pump and Hemovac  were discontinued.  Wound was clean and dry, otherwise stable.  Postoperative day 3, the patient complained of moderate pain, nausea or  vomiting, ambulating without difficulty.  Temperature 97.5, hemoglobin  10.1, oxygen saturation on room air 96.  Chest x-ray was negative for  any acute changes.  Dressing was dry and wound was benign.  He was  otherwise medically stable.  By the afternoon he had passed his physical  therapy goals of safe transfer and ambulation and was discharged home to  the care of his family.  His diet is regular.  Prescriptions are given  for Robaxin, Lovenox, and Percocet.  He will be followed by Genevieve Norlander for  home health physical therapy and durable medical equipment including  CPM.   ACTIVITIES:  Weightbearing as tolerated, total knee precaution,  return  to clinic in 2 week's time, calling [phone] for appointment.  Dressing  changes daily or as needed to keep the wound clean and dry.   MEDICATIONS:  At time of discharge;  1. Crestor 10 mg daily.  2. Alprazolam 0.5 mg 3 times a day.  3. Lisinopril 40 mg daily.  4. Spiriva inhaler daily.  5. Advair 250/50 every 12 hours.  6. Xopenex nebulizer every 6 hours.   Was given prescriptions for Percocet to take as needed, Robaxin as  needed for spasm and Lovenox 40 mg subcutaneously daily for total of 14  days postoperatively.   DIET:  Regular.      Laural Benes. Jannet Mantis.     ______________________________  Mila Homer Sherlean Foot, M.D.    Merita Norton  D:  11/26/2007  T:  11/27/2007  Job:  [id number]

## 2010-09-16 NOTE — Cardiovascular Report (Signed)
NAMEKHALIL, SZCZEPANIK             ACCOUNT NO.:  [id number]   MEDICAL RECORD NO.:  [id number]          PATIENT TYPE:  OIB   LOCATION:  2899                         FACILITY:  [facility]   PHYSICIAN:  Eduardo Osier. Sharyn Lull, M.D. DATE OF BIRTH:  [birth year]   DATE OF PROCEDURE:  10/26/2005  DATE OF DISCHARGE:                              CARDIAC CATHETERIZATION   PROCEDURE:  1.  Left cardiac catheterization with selective left and right coronary      angiography.  2.  Left ventriculography via right groin using Judkins technique   INDICATION FOR THE PROCEDURE:  Mr. Fukushima is 70 year old white male with  past medical history significant for hypertension, COPD, hypercholesteremia,  remote history of tobacco abuse, positive family history of coronary artery  disease, history of colonic polyp.  He came to the office complaining of  vague retrosternal chest discomfort associated with palpitation and mild  shortness of breath.  Denies any PND, orthopnea, leg swelling.  States had 2-  D echocardiogram by [facility] and subsequently had stress Myoview which was  abnormal, although the result is not available and patient was advised for  left catheterization at [facility].  Patient came to office  here as he wanted the catheterization to be done at [facility].  Patient denies any cough, fever, chills.  Denies hemoptysis.  Denies weight  loss.  Denies abdominal pain.  Denies any weakness or slurred speech.  Denies any claudication pain.   PAST MEDICAL HISTORY:  As above.   PAST SURGICAL HISTORY:  1.  He had testicular growth resection in 1988.  2.  Had cholecystectomy in the past.  3.  Had bilateral hernia repair.  4.  Had nasal surgery many years ago.  5.  Had dental implants many years ago.   ALLERGIES:  No known drug allergies.   MEDICATION AT HOME:  1.  Crestor 10 mg p.o. daily.  2.  Lisinopril 20 mg p.o. daily.  3.  Spiriva 18 mcg one puff daily.  4.  Azmacort daily.  5.  Xopenex inhaler daily.   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  Smoked less than one pack per day for  five/six years, quit in 1960s.  Drinks socially occasionally.   FAMILY HISTORY:  Father died of MI at the age of 54.  Mother died of stroke  in her 68s.   PHYSICAL EXAMINATION:  GENERAL:  He was alert, awake, oriented x3, in no  acute distress.  VITAL SIGNS:  Blood pressure was 140/80, pulse was 75, regular.  HEENT:  Conjunctiva was pink.  NECK:  Supple.  No JVD.  No bruit.  LUNGS:  Decreased breath sounds at bases.  There was no wheezing, rhonchi,  or rales.  CARDIOVASCULAR:  S1, S2 was normal.  There was soft systolic murmur at the  apex.  ABDOMEN:  Soft.  Bowel sounds were present, nontender.  There was no mass or  organomegaly.  EXTREMITIES:  There is no clubbing, cyanosis, or edema.   IMPRESSION:  1.  Recurrent chest pain.  2.  Exertional dyspnea.  3.  Positive stress Myoview, rule out coronary insufficiency.  4.  Hypertension.  5.  Hypercholesteremia.  6.  Chronic obstructive pulmonary disease.  7.  Remote history of tobacco abuse.  8.  Colonic polyp.  9.  Positive family history of coronary artery disease.   PLAN:  Add enteric-coated aspirin 325 mg p.o. daily, Plavix 75 mg p.o.  daily, Toprol 25 mg p.o. daily.  Discussed with patient regarding left  catheterization, possible PTCA/stenting, its risks and benefits, i.e.,  death, MI, stroke, need for emergency CABG, risk of restenosis, local  vascular complications, etc. and consented for the procedure.   PROCEDURE:  After obtaining the informed consent patient was brought to the  catheterization laboratory and was placed on fluoroscopy table.  Right groin  was prepped and draped in usual fashion.  2% Xylocaine was used for local  anesthesia in the right groin.  With the help of thin wall needle 6-French  arterial sheath was placed.  Sheath was  aspirated and flushed.  Next, a 6-  French left Judkins catheter was advanced over the wire under fluoroscopic  guidance up to the ascending aorta.  Wire was pulled out.  The catheter was  aspirated and connected to the manifold.  Catheter was further advanced and  engaged into left coronary ostium.  Multiple views of the left system were  taken.  Next, the catheter was disengaged and was pulled out over the wire  and was replaced with 6-French right Judkins catheter which was advanced  over the wire under fluoroscopic guidance up to the ascending aorta.  Wire  was pulled out.  The catheter was aspirated and connected to the manifold.  Catheter was further advanced and engaged into right coronary ostium.  Multiple views of the right system were taken.  Next, the catheter was  disengaged and was pulled out over the wire and was replaced with 6-French  pigtail catheter which was advanced over the wire under fluoroscopic  guidance up to the ascending aorta.  Wire was pulled out.  The catheter was  aspirated and connected to the manifold.  Catheter was further advanced  across the aortic valve into the LV.  LV pressures were recorded.  Next, LV  graphy was done in 30 degree RAO position.  Post angiographic pressures were  recorded from LV and then pullback pressures were recorded from the aorta.  There was no gradient across the aortic valve.  Next, the pigtail catheter  was pulled out over the wire.  Sheaths were aspirated and flushed.   FINDINGS:  LV showed mild global hypokinesia, EF of approximately 50%.  Left  main was short which was patent.  LAD was patent.  Diagonal 1 is very, very  small.  Diagonal 2 and 3 were small which were patent.  Left circumflex has  15-20% proximal stenosis.  OM1 is very small which is patent.  OM2 is  moderate sized which is patent.  RCA is patent.  PDA is small which is patent.  Patient tolerated procedure well.  There were no  complications.  Patient was  transferred to recovery room in stable  condition.  Plan is to continue with present medical management, reduce the  aspirin to 81 mg p.o. daily.  Patient did not start his Plavix and has been  advised not to start Plavix anymore.  Patient will be discharged home this  afternoon if hemodynamically stable.  ______________________________  Eduardo Osier Sharyn Lull, M.D.     MNH/MEDQ  D:  10/26/2005  T:  10/26/2005  Job:  [id number]   cc:   Cath Lab   Adrian Blackwater, M.D.

## 2010-09-16 NOTE — Discharge Summary (Signed)
[name], [name]             ACCOUNT NO.:  [id number]   MEDICAL RECORD NO.:  [id number]          PATIENT TYPE:  INP   LOCATION:  5731                         FACILITY:  [facility]   PHYSICIAN:  Leighton Roach McDiarmid, M.D.DATE OF BIRTH:  October 23, 1940   DATE OF ADMISSION:  12/23/2006  DATE OF DISCHARGE:  12/25/2006                               DISCHARGE SUMMARY   DICTATED BY:  Alcide Evener.   PRIMARY CARE PHYSICIAN:  Unknown.   PULMONOLOGIST:  Dr. Meredeth Ide in [facility].   REASON FOR ADMISSION:  Mild COPD exacerbation.   PERTINENT LABS ON ADMISSION:  White count 7.0.  Normal basic metabolic  panel.  Normal cardiac enzymes.  BNP 33.   DISCHARGE DIAGNOSES:  1. Chronic obstructive pulmonary disease exacerbation.  2. Sleep apnea.  3. Hypertension.  4. Anxiety.  5. Hyperlipidemia.   DISCHARGE MEDICATIONS:  Include the following home medications, which  were not changed:  1. Lisinopril 40 mg by mouth daily.  2. Crestor 10 mg by mouth daily.  3. Aspirin 81 mg by mouth daily.  4. Paxil 10 mg by mouth daily.  5. Xopenex 1.25 mg per 3 mL inhaled via nebulized treatment every 4-6      hours as needed.  6. Multivitamin 1 tab by mouth daily.   The following medications were new upon discharge:  1. Prednisone 60 mg by mouth daily with food for 8 days.  2. Spiriva 18 mcg capsule, 1 capsule inhaled daily.   HOSPITAL COURSE:  1. [name] was admitted with what was suspected to be  a COPD      exacerbation.  As such, he was started on Avelox, Atrovent nebs,      albuterol nebs, and prednisone.  After his chest x-ray was read as      clear and the patient remained afebrile with no change in sputum      production, Avelox and Atrovent nebulizers were discontinued, and      Spiriva and Xopenex were begun for long- and short-term      bronchodilation.  The patient has been on various COPD medications,      but seemingly did not have a COPD diagnosis, as he was not familiar      with the term and apparently never been told that he likely had      emphysema.  He apparently has been given Advair and Combivent      samples, as well as prescribed Spiriva and Xopenex in the past, but      has never been on a long-term regimen, and has certainly never been      controlled in terms of his symptoms.  The decision was made,      therefore, to put him on Spiriva as a first line treatment for      COPD, and to have him follow up with his pulmonologist for further      tailoring of his regimen.  On day of discharge, the patient was      ambulating well and satting perfectly on room air, both at rest and  during ambulation, and was not requiring oxygen.  2. Sleep apnea:  The patient reports having a diagnosis of sleep      apnea, but does not use CPAP at home and did not use it with the      course of his admission.  On continuous pulse oximetry, he did not      have any episodes of hypoxia throughout the night.  3. Hypertension was moderately well controlled on home lisinopril      during his admission.  No changes were made to his antihypertensive      medications.  4. Anxiety:  This was controlled on his home Paxil dose, and no acute      issues were apparent during his hospitalization.  5. Hyperlipidemia:  We continued his home Crestor while inpatient.  H   CONDITION ON DISCHARGE:  Stable.   PENDING TESTS:  He had no pending tests at time of discharge.   DISPOSITION:  He was discharged to home where he lives with his wife.   DISCHARGE INSTRUCTIONS:  He was instructed to call his pulmonologist,  Dr. Meredeth Ide of [facility], to make an appointment within the  next 2 weeks, both as a hospital followup visit, and also to clarify  both is diagnosis and his optimal regimen.   FOLLOWUP ISSUES:  COPD diagnosis and optimal regimen.  It appears this  patient is unclear regarding whether or not he even has a lung disease,  much less what his diagnosis is.  It  is also clear that he has not been  started on a clear regimen and has been given samples, which he uses  whenever he feels like it, and is constantly chasing his symptoms of  shortness of breath.  This should be the number one item for his  followup appointment with his pulmonologist within the next 2 weeks.      Johney Maine, M.D.  Electronically Signed      Leighton Roach McDiarmid, M.D.  Electronically Signed    JT/MEDQ  D:  12/26/2006  T:  12/27/2006  Job:  [id number]

## 2011-01-19 LAB — COMPREHENSIVE METABOLIC PANEL
Albumin: 4.1
BUN: 9
CO2: 26
Creatinine, Ser: 0.73
Glucose, Bld: 86
Potassium: 3.3 — ABNORMAL LOW
Sodium: 136
Total Protein: 7.1

## 2011-01-19 LAB — CROSSMATCH
ABO/RH(D): O POS
Antibody Screen: NEGATIVE

## 2011-01-19 LAB — URINALYSIS, ROUTINE W REFLEX MICROSCOPIC
Ketones, ur: NEGATIVE
Nitrite: NEGATIVE

## 2011-01-19 LAB — BASIC METABOLIC PANEL
BUN: 8
BUN: 8
CO2: 27
CO2: 28
Chloride: 101
Creatinine, Ser: 0.92
GFR calc non Af Amer: >60
Glucose, Bld: 181 — ABNORMAL HIGH
Potassium: 3.6
Potassium: 4.5
Sodium: 132 — ABNORMAL LOW

## 2011-01-19 LAB — CBC
HCT: 35 — ABNORMAL LOW
Hemoglobin: 10.1 — ABNORMAL LOW
Hemoglobin: 15
Hemoglobin: 9.7 — ABNORMAL LOW
MCHC: 33.3
MCHC: 34
MCHC: 34.7
MCV: 88.1
MCV: 89.8
Platelets: 301
RBC: 3.37 — ABNORMAL LOW
RBC: 5.01
RDW: 13.7
WBC: 10
WBC: 10.3

## 2011-01-19 LAB — PROTIME-INR: Prothrombin Time: 14.3

## 2011-01-19 LAB — DIFFERENTIAL
Basophils Absolute: 0.1
Eosinophils Relative: 0
Lymphs Abs: 2.1
Monocytes Absolute: 0.6
Neutro Abs: 5.7
Neutrophils Relative %: 67

## 2011-01-19 LAB — APTT: aPTT: 27

## 2011-01-26 LAB — DIFFERENTIAL
Basophils Absolute: 0
Basophils Relative: 1
Eosinophils Relative: 3
Lymphocytes Relative: 27
Monocytes Relative: 9
Neutro Abs: 3.6

## 2011-01-26 LAB — COMPREHENSIVE METABOLIC PANEL
ALT: 22
Albumin: 4.6
Alkaline Phosphatase: 58
BUN: 8
Chloride: 100
GFR calc Af Amer: >60
GFR calc non Af Amer: >60
Glucose, Bld: 95
Sodium: 139
Total Bilirubin: 1

## 2011-01-26 LAB — BASIC METABOLIC PANEL
BUN: 10
CO2: 28
CO2: 29
Calcium: 8 — ABNORMAL LOW
Calcium: 8.2 — ABNORMAL LOW
Calcium: 8.5
Chloride: 100
Chloride: 97
Creatinine, Ser: 0.77
Creatinine, Ser: 0.82
GFR calc Af Amer: >60
GFR calc Af Amer: >60
GFR calc Af Amer: >60
Glucose, Bld: 174 — ABNORMAL HIGH
Potassium: 4.2
Sodium: 134 — ABNORMAL LOW
Sodium: 135

## 2011-01-26 LAB — URINE CULTURE

## 2011-01-26 LAB — CBC
HCT: 28.8 — ABNORMAL LOW
HCT: 44.2
Hemoglobin: 10.1 — ABNORMAL LOW
Hemoglobin: 11 — ABNORMAL LOW
Hemoglobin: 14.8
MCHC: 34.7
MCHC: 34.8
MCHC: 34.9
MCV: 85
MCV: 85.5
MCV: 85.9
Platelets: 144 — ABNORMAL LOW
Platelets: 218
RBC: 3.37 — ABNORMAL LOW
RBC: 3.68 — ABNORMAL LOW
RBC: 3.71 — ABNORMAL LOW
RBC: 5.15
RDW: 14.4
RDW: 14.8
WBC: 4.4
WBC: 5.8
WBC: 5.9

## 2011-01-26 LAB — CROSSMATCH
ABO/RH(D): O POS
Antibody Screen: NEGATIVE

## 2011-01-26 LAB — URINALYSIS, ROUTINE W REFLEX MICROSCOPIC
Bilirubin Urine: NEGATIVE
Glucose, UA: NEGATIVE
Nitrite: NEGATIVE
Urobilinogen, UA: 0.2
pH: 8

## 2011-01-26 LAB — PROTIME-INR: INR: 1.1

## 2011-02-10 LAB — DIFFERENTIAL
Basophils Absolute: 0
Basophils Relative: 0
Eosinophils Absolute: 0.5
Eosinophils Relative: 8 — ABNORMAL HIGH
Lymphocytes Relative: 22
Lymphs Abs: 1.5
Monocytes Absolute: 0.6
Monocytes Relative: 8
Neutro Abs: 4.3
Neutrophils Relative %: 62

## 2011-02-10 LAB — BASIC METABOLIC PANEL
BUN: 7
CO2: 28
Calcium: 9.6
Chloride: 98
Creatinine, Ser: 0.73
GFR calc Af Amer: >60
Glucose, Bld: 115 — ABNORMAL HIGH

## 2011-02-10 LAB — BASIC METABOLIC PANEL WITH GFR
GFR calc non Af Amer: >60
Potassium: 3.8
Sodium: 135

## 2011-02-10 LAB — B-NATRIURETIC PEPTIDE (CONVERTED LAB): Pro B Natriuretic peptide (BNP): 33

## 2011-02-10 LAB — CBC
HCT: 42.2
Hemoglobin: 14.4
MCHC: 34.2
MCV: 86.8
Platelets: 259
RBC: 4.86
RDW: 13.2
WBC: 7

## 2011-02-10 LAB — POCT CARDIAC MARKERS: Myoglobin, poc: 120

## 2012-04-15 ENCOUNTER — Ambulatory Visit: Payer: Self-pay | Admitting: Family Medicine

## 2013-02-14 ENCOUNTER — Emergency Department: Payer: Self-pay | Admitting: Emergency Medicine

## 2013-02-14 LAB — URINALYSIS, COMPLETE
Bacteria: NONE SEEN
Blood: NEGATIVE
Ketone: NEGATIVE
Nitrite: NEGATIVE
RBC,UR: <1 /HPF (ref 0–5)
Specific Gravity: 1.008 (ref 1.003–1.030)
WBC UR: NONE SEEN /HPF (ref 0–5)

## 2013-02-14 LAB — CBC
MCH: 30.8 pg (ref 26.0–34.0)
MCHC: 34.6 g/dL (ref 32.0–36.0)
MCV: 89 fL (ref 80–100)
Platelet: 193 10*3/uL (ref 150–440)
RBC: 4.91 10*6/uL (ref 4.40–5.90)
RDW: 13 % (ref 11.5–14.5)
WBC: 5.1 10*3/uL (ref 3.8–10.6)

## 2013-02-14 LAB — BASIC METABOLIC PANEL
Anion Gap: 5 — ABNORMAL LOW (ref 7–16)
BUN: 11 mg/dL (ref 7–18)
Calcium, Total: 9.4 mg/dL (ref 8.5–10.1)
Co2: 29 mmol/L (ref 21–32)
Creatinine: 0.77 mg/dL (ref 0.60–1.30)
EGFR (African American): >60
EGFR (Non-African Amer.): >60
Glucose: 132 mg/dL — ABNORMAL HIGH (ref 65–99)
Osmolality: 279 (ref 275–301)
Potassium: 3.8 mmol/L (ref 3.5–5.1)
Sodium: 139 mmol/L (ref 136–145)

## 2013-06-11 DIAGNOSIS — I10 Essential (primary) hypertension: Secondary | ICD-10-CM | POA: Diagnosis not present

## 2013-06-11 DIAGNOSIS — I2510 Atherosclerotic heart disease of native coronary artery without angina pectoris: Secondary | ICD-10-CM | POA: Diagnosis not present

## 2013-06-11 DIAGNOSIS — E7800 Pure hypercholesterolemia, unspecified: Secondary | ICD-10-CM | POA: Diagnosis not present

## 2013-07-30 DIAGNOSIS — E7800 Pure hypercholesterolemia, unspecified: Secondary | ICD-10-CM | POA: Diagnosis not present

## 2013-07-30 DIAGNOSIS — I10 Essential (primary) hypertension: Secondary | ICD-10-CM | POA: Diagnosis not present

## 2013-07-30 DIAGNOSIS — R7309 Other abnormal glucose: Secondary | ICD-10-CM | POA: Diagnosis not present

## 2013-07-30 DIAGNOSIS — Z125 Encounter for screening for malignant neoplasm of prostate: Secondary | ICD-10-CM | POA: Diagnosis not present

## 2013-09-04 DIAGNOSIS — I10 Essential (primary) hypertension: Secondary | ICD-10-CM | POA: Diagnosis not present

## 2013-09-04 DIAGNOSIS — J449 Chronic obstructive pulmonary disease, unspecified: Secondary | ICD-10-CM | POA: Diagnosis not present

## 2013-09-04 DIAGNOSIS — E7800 Pure hypercholesterolemia, unspecified: Secondary | ICD-10-CM | POA: Diagnosis not present

## 2013-09-04 DIAGNOSIS — I2510 Atherosclerotic heart disease of native coronary artery without angina pectoris: Secondary | ICD-10-CM | POA: Diagnosis not present

## 2013-09-04 DIAGNOSIS — M159 Polyosteoarthritis, unspecified: Secondary | ICD-10-CM | POA: Diagnosis not present

## 2013-12-11 DIAGNOSIS — L819 Disorder of pigmentation, unspecified: Secondary | ICD-10-CM | POA: Diagnosis not present

## 2013-12-11 DIAGNOSIS — D239 Other benign neoplasm of skin, unspecified: Secondary | ICD-10-CM | POA: Diagnosis not present

## 2013-12-11 DIAGNOSIS — D1801 Hemangioma of skin and subcutaneous tissue: Secondary | ICD-10-CM | POA: Diagnosis not present

## 2013-12-11 DIAGNOSIS — L821 Other seborrheic keratosis: Secondary | ICD-10-CM | POA: Diagnosis not present

## 2013-12-17 DIAGNOSIS — M159 Polyosteoarthritis, unspecified: Secondary | ICD-10-CM | POA: Diagnosis not present

## 2013-12-17 DIAGNOSIS — I10 Essential (primary) hypertension: Secondary | ICD-10-CM | POA: Diagnosis not present

## 2013-12-17 DIAGNOSIS — E785 Hyperlipidemia, unspecified: Secondary | ICD-10-CM | POA: Diagnosis not present

## 2013-12-17 DIAGNOSIS — I2510 Atherosclerotic heart disease of native coronary artery without angina pectoris: Secondary | ICD-10-CM | POA: Diagnosis not present

## 2013-12-17 DIAGNOSIS — J449 Chronic obstructive pulmonary disease, unspecified: Secondary | ICD-10-CM | POA: Diagnosis not present

## 2014-01-30 ENCOUNTER — Encounter: Payer: Self-pay | Admitting: Gastroenterology

## 2014-02-11 DIAGNOSIS — Z23 Encounter for immunization: Secondary | ICD-10-CM | POA: Diagnosis not present

## 2014-02-18 DIAGNOSIS — J029 Acute pharyngitis, unspecified: Secondary | ICD-10-CM | POA: Diagnosis not present

## 2014-03-10 DIAGNOSIS — J40 Bronchitis, not specified as acute or chronic: Secondary | ICD-10-CM | POA: Diagnosis not present

## 2014-03-18 DIAGNOSIS — J209 Acute bronchitis, unspecified: Secondary | ICD-10-CM | POA: Diagnosis not present

## 2014-03-18 DIAGNOSIS — E785 Hyperlipidemia, unspecified: Secondary | ICD-10-CM | POA: Diagnosis not present

## 2014-03-18 DIAGNOSIS — I10 Essential (primary) hypertension: Secondary | ICD-10-CM | POA: Diagnosis not present

## 2014-03-18 DIAGNOSIS — I2510 Atherosclerotic heart disease of native coronary artery without angina pectoris: Secondary | ICD-10-CM | POA: Diagnosis not present

## 2014-03-18 DIAGNOSIS — J449 Chronic obstructive pulmonary disease, unspecified: Secondary | ICD-10-CM | POA: Diagnosis not present

## 2014-03-18 DIAGNOSIS — M1990 Unspecified osteoarthritis, unspecified site: Secondary | ICD-10-CM | POA: Diagnosis not present

## 2014-03-30 DIAGNOSIS — H01003 Unspecified blepharitis right eye, unspecified eyelid: Secondary | ICD-10-CM | POA: Diagnosis not present

## 2014-03-30 DIAGNOSIS — H2513 Age-related nuclear cataract, bilateral: Secondary | ICD-10-CM | POA: Diagnosis not present

## 2014-06-10 DIAGNOSIS — J029 Acute pharyngitis, unspecified: Secondary | ICD-10-CM | POA: Diagnosis not present

## 2014-06-10 DIAGNOSIS — M1990 Unspecified osteoarthritis, unspecified site: Secondary | ICD-10-CM | POA: Diagnosis not present

## 2014-06-10 DIAGNOSIS — I2510 Atherosclerotic heart disease of native coronary artery without angina pectoris: Secondary | ICD-10-CM | POA: Diagnosis not present

## 2014-06-10 DIAGNOSIS — I10 Essential (primary) hypertension: Secondary | ICD-10-CM | POA: Diagnosis not present

## 2014-06-10 DIAGNOSIS — E785 Hyperlipidemia, unspecified: Secondary | ICD-10-CM | POA: Diagnosis not present

## 2014-06-10 DIAGNOSIS — J449 Chronic obstructive pulmonary disease, unspecified: Secondary | ICD-10-CM | POA: Diagnosis not present

## 2014-07-02 DIAGNOSIS — J449 Chronic obstructive pulmonary disease, unspecified: Secondary | ICD-10-CM | POA: Diagnosis not present

## 2014-07-02 DIAGNOSIS — I482 Chronic atrial fibrillation: Secondary | ICD-10-CM | POA: Diagnosis not present

## 2014-07-02 DIAGNOSIS — I2510 Atherosclerotic heart disease of native coronary artery without angina pectoris: Secondary | ICD-10-CM | POA: Diagnosis not present

## 2014-07-02 DIAGNOSIS — M1990 Unspecified osteoarthritis, unspecified site: Secondary | ICD-10-CM | POA: Diagnosis not present

## 2014-07-02 DIAGNOSIS — E785 Hyperlipidemia, unspecified: Secondary | ICD-10-CM | POA: Diagnosis not present

## 2014-07-02 DIAGNOSIS — I10 Essential (primary) hypertension: Secondary | ICD-10-CM | POA: Diagnosis not present

## 2014-07-06 ENCOUNTER — Emergency Department: Payer: Self-pay | Admitting: Emergency Medicine

## 2014-07-06 DIAGNOSIS — J449 Chronic obstructive pulmonary disease, unspecified: Secondary | ICD-10-CM | POA: Diagnosis not present

## 2014-07-06 DIAGNOSIS — Z7902 Long term (current) use of antithrombotics/antiplatelets: Secondary | ICD-10-CM | POA: Diagnosis not present

## 2014-07-06 DIAGNOSIS — R0602 Shortness of breath: Secondary | ICD-10-CM | POA: Diagnosis not present

## 2014-07-06 DIAGNOSIS — R079 Chest pain, unspecified: Secondary | ICD-10-CM | POA: Diagnosis not present

## 2014-07-06 DIAGNOSIS — I10 Essential (primary) hypertension: Secondary | ICD-10-CM | POA: Diagnosis not present

## 2014-07-06 DIAGNOSIS — R05 Cough: Secondary | ICD-10-CM | POA: Diagnosis not present

## 2014-07-07 DIAGNOSIS — I10 Essential (primary) hypertension: Secondary | ICD-10-CM | POA: Diagnosis not present

## 2014-07-07 DIAGNOSIS — M1990 Unspecified osteoarthritis, unspecified site: Secondary | ICD-10-CM | POA: Diagnosis not present

## 2014-07-07 DIAGNOSIS — I482 Chronic atrial fibrillation: Secondary | ICD-10-CM | POA: Diagnosis not present

## 2014-07-07 DIAGNOSIS — I2510 Atherosclerotic heart disease of native coronary artery without angina pectoris: Secondary | ICD-10-CM | POA: Diagnosis not present

## 2014-07-07 DIAGNOSIS — E785 Hyperlipidemia, unspecified: Secondary | ICD-10-CM | POA: Diagnosis not present

## 2014-07-07 DIAGNOSIS — J449 Chronic obstructive pulmonary disease, unspecified: Secondary | ICD-10-CM | POA: Diagnosis not present

## 2014-07-10 ENCOUNTER — Encounter (HOSPITAL_COMMUNITY): Payer: Self-pay | Admitting: Emergency Medicine

## 2014-07-10 ENCOUNTER — Emergency Department (HOSPITAL_COMMUNITY): Payer: Medicare Other

## 2014-07-10 ENCOUNTER — Inpatient Hospital Stay (HOSPITAL_COMMUNITY)
Admission: EM | Admit: 2014-07-10 | Discharge: 2014-07-15 | DRG: 286 | Disposition: A | Discharge status: Home / Self Care | Payer: Medicare Other | Attending: Cardiology | Admitting: Cardiology

## 2014-07-10 ENCOUNTER — Observation Stay (HOSPITAL_COMMUNITY): Payer: Medicare Other

## 2014-07-10 DIAGNOSIS — I420 Dilated cardiomyopathy: Secondary | ICD-10-CM | POA: Diagnosis present

## 2014-07-10 DIAGNOSIS — I5023 Acute on chronic systolic (congestive) heart failure: Secondary | ICD-10-CM | POA: Diagnosis not present

## 2014-07-10 DIAGNOSIS — R911 Solitary pulmonary nodule: Secondary | ICD-10-CM | POA: Diagnosis present

## 2014-07-10 DIAGNOSIS — E785 Hyperlipidemia, unspecified: Secondary | ICD-10-CM | POA: Diagnosis present

## 2014-07-10 DIAGNOSIS — I719 Aortic aneurysm of unspecified site, without rupture: Secondary | ICD-10-CM

## 2014-07-10 DIAGNOSIS — I509 Heart failure, unspecified: Secondary | ICD-10-CM

## 2014-07-10 DIAGNOSIS — R7309 Other abnormal glucose: Secondary | ICD-10-CM | POA: Diagnosis not present

## 2014-07-10 DIAGNOSIS — I2510 Atherosclerotic heart disease of native coronary artery without angina pectoris: Secondary | ICD-10-CM | POA: Diagnosis not present

## 2014-07-10 DIAGNOSIS — I5021 Acute systolic (congestive) heart failure: Secondary | ICD-10-CM

## 2014-07-10 DIAGNOSIS — R740 Nonspecific elevation of levels of transaminase and lactic acid dehydrogenase [LDH]: Secondary | ICD-10-CM

## 2014-07-10 DIAGNOSIS — I10 Essential (primary) hypertension: Secondary | ICD-10-CM | POA: Diagnosis present

## 2014-07-10 DIAGNOSIS — Z7901 Long term (current) use of anticoagulants: Secondary | ICD-10-CM | POA: Diagnosis not present

## 2014-07-10 DIAGNOSIS — I482 Chronic atrial fibrillation: Principal | ICD-10-CM | POA: Diagnosis present

## 2014-07-10 DIAGNOSIS — I351 Nonrheumatic aortic (valve) insufficiency: Secondary | ICD-10-CM | POA: Diagnosis present

## 2014-07-10 DIAGNOSIS — R7401 Elevation of levels of liver transaminase levels: Secondary | ICD-10-CM

## 2014-07-10 DIAGNOSIS — Z87891 Personal history of nicotine dependence: Secondary | ICD-10-CM

## 2014-07-10 DIAGNOSIS — J90 Pleural effusion, not elsewhere classified: Secondary | ICD-10-CM | POA: Diagnosis not present

## 2014-07-10 DIAGNOSIS — I77810 Thoracic aortic ectasia: Secondary | ICD-10-CM | POA: Diagnosis present

## 2014-07-10 DIAGNOSIS — I5020 Unspecified systolic (congestive) heart failure: Secondary | ICD-10-CM | POA: Diagnosis not present

## 2014-07-10 DIAGNOSIS — Z9049 Acquired absence of other specified parts of digestive tract: Secondary | ICD-10-CM | POA: Diagnosis present

## 2014-07-10 DIAGNOSIS — I4891 Unspecified atrial fibrillation: Secondary | ICD-10-CM | POA: Diagnosis not present

## 2014-07-10 DIAGNOSIS — Z96653 Presence of artificial knee joint, bilateral: Secondary | ICD-10-CM | POA: Diagnosis present

## 2014-07-10 DIAGNOSIS — R591 Generalized enlarged lymph nodes: Secondary | ICD-10-CM | POA: Diagnosis not present

## 2014-07-10 DIAGNOSIS — E780 Pure hypercholesterolemia: Secondary | ICD-10-CM | POA: Diagnosis present

## 2014-07-10 DIAGNOSIS — M1990 Unspecified osteoarthritis, unspecified site: Secondary | ICD-10-CM | POA: Diagnosis present

## 2014-07-10 DIAGNOSIS — J449 Chronic obstructive pulmonary disease, unspecified: Secondary | ICD-10-CM | POA: Diagnosis not present

## 2014-07-10 DIAGNOSIS — J45909 Unspecified asthma, uncomplicated: Secondary | ICD-10-CM | POA: Diagnosis present

## 2014-07-10 DIAGNOSIS — R0602 Shortness of breath: Secondary | ICD-10-CM | POA: Diagnosis not present

## 2014-07-10 HISTORY — DX: Essential (primary) hypertension: I10

## 2014-07-10 HISTORY — DX: Heart failure, unspecified: I50.9

## 2014-07-10 HISTORY — DX: Unspecified asthma, uncomplicated: J45.909

## 2014-07-10 HISTORY — DX: Acute systolic (congestive) heart failure: I50.21

## 2014-07-10 HISTORY — DX: Unspecified atrial fibrillation: I48.91

## 2014-07-10 LAB — CBC WITH DIFFERENTIAL/PLATELET
Basophils Absolute: 0 10*3/uL (ref 0.0–0.1)
Basophils Absolute: 0 10*3/uL (ref 0.0–0.1)
Basophils Relative: 0 % (ref 0–1)
Basophils Relative: 0 % (ref 0–1)
EOS PCT: 1 % (ref 0–5)
EOS PCT: 1 % (ref 0–5)
Eosinophils Absolute: 0.1 10*3/uL (ref 0.0–0.7)
Eosinophils Absolute: 0.1 10*3/uL (ref 0.0–0.7)
HCT: 41.2 % (ref 39.0–52.0)
HCT: 41.8 % (ref 39.0–52.0)
Hemoglobin: 13.9 g/dL (ref 13.0–17.0)
Hemoglobin: 13.9 g/dL (ref 13.0–17.0)
LYMPHS ABS: 1.2 10*3/uL (ref 0.7–4.0)
LYMPHS PCT: 21 % (ref 12–46)
LYMPHS PCT: 29 % (ref 12–46)
Lymphs Abs: 1.4 10*3/uL (ref 0.7–4.0)
MCH: 30.3 pg (ref 26.0–34.0)
MCH: 30 pg (ref 26.0–34.0)
MCHC: 33.7 g/dL (ref 30.0–36.0)
MCHC: 33.3 g/dL (ref 30.0–36.0)
MCV: 90 fL (ref 78.0–100.0)
MCV: 90.1 fL (ref 78.0–100.0)
MONO ABS: 0.8 10*3/uL (ref 0.1–1.0)
MONO ABS: 0.5 10*3/uL (ref 0.1–1.0)
MONOS PCT: 13 % — AB (ref 3–12)
Monocytes Relative: 11 % (ref 3–12)
Neutro Abs: 3.8 10*3/uL (ref 1.7–7.7)
Neutro Abs: 2.7 10*3/uL (ref 1.7–7.7)
Neutrophils Relative %: 65 % (ref 43–77)
Neutrophils Relative %: 59 % (ref 43–77)
PLATELETS: 168 10*3/uL (ref 150–400)
Platelets: 163 10*3/uL (ref 150–400)
RBC: 4.58 MIL/uL (ref 4.22–5.81)
RBC: 4.64 MIL/uL (ref 4.22–5.81)
RDW: 13.1 % (ref 11.5–15.5)
RDW: 13.2 % (ref 11.5–15.5)
WBC: 5.9 10*3/uL (ref 4.0–10.5)
WBC: 4.6 10*3/uL (ref 4.0–10.5)

## 2014-07-10 LAB — COMPREHENSIVE METABOLIC PANEL
ALBUMIN: 4.2 g/dL (ref 3.5–5.2)
ALK PHOS: 66 U/L (ref 39–117)
ALT: 149 U/L — AB (ref 0–53)
AST: 79 U/L — AB (ref 0–37)
Anion gap: 9 (ref 5–15)
BUN: 14 mg/dL (ref 6–23)
CALCIUM: 9 mg/dL (ref 8.4–10.5)
CO2: 24 mmol/L (ref 19–32)
Chloride: 103 mmol/L (ref 96–112)
Creatinine, Ser: 1 mg/dL (ref 0.50–1.35)
GFR calc Af Amer: 85 mL/min — ABNORMAL LOW (ref >90)
GFR calc non Af Amer: 73 mL/min — ABNORMAL LOW (ref >90)
GLUCOSE: 134 mg/dL — AB (ref 70–99)
POTASSIUM: 4.4 mmol/L (ref 3.5–5.1)
SODIUM: 136 mmol/L (ref 135–145)
TOTAL PROTEIN: 6.5 g/dL (ref 6.0–8.3)
Total Bilirubin: 1.4 mg/dL — ABNORMAL HIGH (ref 0.3–1.2)

## 2014-07-10 LAB — BRAIN NATRIURETIC PEPTIDE
B NATRIURETIC PEPTIDE 5: 619 pg/mL — AB (ref 0.0–100.0)
B Natriuretic Peptide: 758.2 pg/mL — ABNORMAL HIGH (ref 0.0–100.0)

## 2014-07-10 LAB — I-STAT TROPONIN, ED: Troponin i, poc: 0.01 ng/mL (ref 0.00–0.08)

## 2014-07-10 LAB — TROPONIN I
Troponin I: <0.03 ng/mL (ref <0.031)
Troponin I: <0.03 ng/mL (ref <0.031)

## 2014-07-10 LAB — MAGNESIUM: Magnesium: 2.1 mg/dL (ref 1.5–2.5)

## 2014-07-10 MED ORDER — ALPRAZOLAM 0.25 MG PO TABS
0.2500 mg | ORAL_TABLET | Freq: Two times a day (BID) | ORAL | Status: DC
  Administered 2014-07-10 – 2014-07-15 (×11): 0.25 mg via ORAL
  Filled 2014-07-10 (×11): qty 1

## 2014-07-10 MED ORDER — METOPROLOL SUCCINATE ER 100 MG PO TB24
100.0000 mg | ORAL_TABLET | Freq: Every day | ORAL | Status: DC
Start: 2014-07-11 — End: 2014-07-11
  Filled 2014-07-10: qty 1

## 2014-07-10 MED ORDER — FUROSEMIDE 10 MG/ML IJ SOLN
40.0000 mg | Freq: Every day | INTRAMUSCULAR | Status: DC
  Administered 2014-07-10 – 2014-07-11 (×2): 40 mg via INTRAVENOUS
  Filled 2014-07-10 (×2): qty 4

## 2014-07-10 MED ORDER — SODIUM CHLORIDE 0.9 % IJ SOLN
3.0000 mL | Freq: Two times a day (BID) | INTRAMUSCULAR | Status: DC
  Administered 2014-07-10 – 2014-07-15 (×6): 3 mL via INTRAVENOUS

## 2014-07-10 MED ORDER — IOHEXOL 350 MG/ML SOLN
100.0000 mL | Freq: Once | INTRAVENOUS | Status: AC | PRN
  Administered 2014-07-10: 100 mL via INTRAVENOUS

## 2014-07-10 MED ORDER — MOMETASONE FURO-FORMOTEROL FUM 100-5 MCG/ACT IN AERO
2.0000 | INHALATION_SPRAY | Freq: Two times a day (BID) | RESPIRATORY_TRACT | Status: DC
  Administered 2014-07-10 – 2014-07-15 (×8): 2 via RESPIRATORY_TRACT
  Filled 2014-07-10 (×2): qty 8.8

## 2014-07-10 MED ORDER — FUROSEMIDE 10 MG/ML IJ SOLN
40.0000 mg | Freq: Once | INTRAMUSCULAR | Status: AC
  Administered 2014-07-10: 40 mg via INTRAVENOUS
  Filled 2014-07-10: qty 4

## 2014-07-10 MED ORDER — METOPROLOL SUCCINATE ER 100 MG PO TB24
100.0000 mg | ORAL_TABLET | Freq: Every day | ORAL | Status: DC
  Administered 2014-07-10: 100 mg via ORAL
  Filled 2014-07-10: qty 1

## 2014-07-10 MED ORDER — ONDANSETRON HCL 4 MG/2ML IJ SOLN: 4.0000 mg | Freq: Four times a day (QID) | INTRAMUSCULAR | Status: DC | PRN

## 2014-07-10 MED ORDER — SPIRONOLACTONE 25 MG PO TABS
25.0000 mg | ORAL_TABLET | Freq: Every day | ORAL | Status: DC
  Administered 2014-07-10 – 2014-07-15 (×5): 25 mg via ORAL
  Filled 2014-07-10 (×6): qty 1

## 2014-07-10 MED ORDER — ALBUTEROL SULFATE (2.5 MG/3ML) 0.083% IN NEBU
3.0000 mL | INHALATION_SOLUTION | RESPIRATORY_TRACT | Status: DC | PRN
  Administered 2014-07-13: 3 mL via RESPIRATORY_TRACT

## 2014-07-10 MED ORDER — METOPROLOL SUCCINATE ER 50 MG PO TB24: 50.0000 mg | ORAL_TABLET | Freq: Every day | ORAL | Status: DC

## 2014-07-10 MED ORDER — METOPROLOL SUCCINATE ER 50 MG PO TB24
50.0000 mg | ORAL_TABLET | Freq: Every day | ORAL | Status: DC
  Administered 2014-07-10: 50 mg via ORAL
  Filled 2014-07-10 (×2): qty 1

## 2014-07-10 MED ORDER — AZITHROMYCIN 250 MG PO TABS
250.0000 mg | ORAL_TABLET | Freq: Every day | ORAL | Status: DC
  Administered 2014-07-10 – 2014-07-12 (×3): 250 mg via ORAL
  Filled 2014-07-10 (×4): qty 1

## 2014-07-10 MED ORDER — SODIUM CHLORIDE 0.9 % IJ SOLN: 3.0000 mL | INTRAMUSCULAR | Status: DC | PRN

## 2014-07-10 MED ORDER — LOSARTAN POTASSIUM 25 MG PO TABS
25.0000 mg | ORAL_TABLET | Freq: Every day | ORAL | Status: DC
  Administered 2014-07-10 – 2014-07-11 (×2): 25 mg via ORAL
  Filled 2014-07-10 (×3): qty 1

## 2014-07-10 MED ORDER — SODIUM CHLORIDE 0.9 % IV SOLN: 250.0000 mL | INTRAVENOUS | Status: DC | PRN

## 2014-07-10 MED ORDER — ACETAMINOPHEN 325 MG PO TABS: 650.0000 mg | ORAL_TABLET | ORAL | Status: DC | PRN

## 2014-07-10 MED ORDER — APIXABAN 5 MG PO TABS
5.0000 mg | ORAL_TABLET | Freq: Two times a day (BID) | ORAL | Status: DC
  Administered 2014-07-10 – 2014-07-11 (×4): 5 mg via ORAL
  Filled 2014-07-10 (×7): qty 1

## 2014-07-10 MED ORDER — ATORVASTATIN CALCIUM 20 MG PO TABS
20.0000 mg | ORAL_TABLET | Freq: Every day | ORAL | Status: DC
  Administered 2014-07-10 – 2014-07-14 (×5): 20 mg via ORAL
  Filled 2014-07-10 (×6): qty 1

## 2014-07-10 MED ORDER — TIOTROPIUM BROMIDE MONOHYDRATE 18 MCG IN CAPS
18.0000 ug | ORAL_CAPSULE | Freq: Every day | RESPIRATORY_TRACT | Status: DC
  Administered 2014-07-10 – 2014-07-15 (×5): 18 ug via RESPIRATORY_TRACT
  Filled 2014-07-10: qty 5

## 2014-07-10 MED ORDER — POTASSIUM CHLORIDE 20 MEQ/15ML (10%) PO SOLN
20.0000 meq | Freq: Every day | ORAL | Status: DC
  Administered 2014-07-10 – 2014-07-12 (×3): 20 meq via ORAL
  Filled 2014-07-10 (×3): qty 15

## 2014-07-10 NOTE — Progress Notes (Signed)
The patient arrived to 3E02 from the ED at 0540.  He was oriented to the unit and placed on telemetry.  He is A&Ox4, up ad lib, and does not have any complaints of pain at this time.  The pt's VS are stable and his heart rhythm is A. Fib.  The call bell was explained and placed within reach.  Dr. Terrence Dupont was notified of the patient's arrival to the floor.

## 2014-07-10 NOTE — Progress Notes (Signed)
  Echocardiogram 2D Echocardiogram has been performed.  [name] FRANCES 07/10/2014, 1:34 PM

## 2014-07-10 NOTE — ED Notes (Signed)
Pt reports feeling like he is unable to breathe when lying down, pt was recently seen at [facility] for a productive cough.  Pt denies CP.  Pt speaking in full sentences.

## 2014-07-10 NOTE — Progress Notes (Signed)
Heart Failure Navigator Consult Note  Presentation: [name] is 74 year old male with past medical history significant for mild coronary artery disease, hypertension, prediabetic hypercholesterolemia, degenerative joint disease, COPD, chronic atrial fibrillation, treated with rate control and anticoagulants, came to the ER complaining of progressive increasing shortness of breath associated with coughing for approximately 2 weeks patient was seen at [facility] a few days ago had chest x-ray which was negative showed signs of COPD and was started yesterday on Z-Pak without much improvement so decided to come to the ED for further evaluation. Patient was noted to be in A. fib with moderate ventricular response and mild CHF. Patient received IV Lasix with improvement in his symptoms.   Past Medical History  Diagnosis Date  . A-fib   . Hypertension     History   Social History  . Marital Status: Married    Spouse Name: N/A  . Number of Children: N/A  . Years of Education: N/A   Social History Main Topics  . Smoking status: Never Smoker   . Smokeless tobacco: Not on file  . Alcohol Use: No  . Drug Use: Not on file  . Sexual Activity: Not on file   Other Topics Concern  . None   Social History Narrative  . None    ECHO: pending  BNP    Component Value Date/Time   BNP 758.2* 07/10/2014 0925    ProBNP    Component Value Date/Time   PROBNP 33.0 12/23/2006 1930     Education Assessment and Provision:  Detailed education and instructions provided on heart failure disease management including the following:  Signs and symptoms of Heart Failure When to call the physician Importance of daily weights Low sodium diet Fluid restriction Medication management Anticipated future follow-up appointments  Patient education given on each of the above topics.  Patient acknowledges understanding and acceptance of all instructions. I discussed HF with [name].   He relates that he does not have shortness of breath except at night and describes that as a "panicky feeling as soon as I try to go to sleep".  He also claims he has no trouble with swelling in feet or legs.  He lives alone --and says his wife is in poor medical health and lives with her sister.  He has a scale however says he does not weigh daily.  I have encouraged him to weigh daily and stressed why that it is important.  He tells me he eats at [employer] at least 3 times a week and also says that he eats healthy.  He says he loves [employer] chicken noodle soup.  I have discussed a low sodium diet and high sodium foods to avoid.  He sees Dr. Terrence Dupont as an outpatient.  I will plan to see him Monday if he remains in the hospital.  Education Materials:  "Living Better With Heart Failure" Booklet, Daily Weight Tracker Tool    High Risk Criteria for Readmission and/or Poor Patient Outcomes:   EF <30%-echo pending  2 or more admissions in 6 months- No  Difficult social situation- No  Demonstrates medication noncompliance- No   Barriers of Care:  Knowledge and compliance  Discharge Planning:  Plans to return home alone

## 2014-07-10 NOTE — ED Provider Notes (Signed)
CSN: [id number]     Arrival date & time 07/10/14  0136 History  This chart was scribed for Delora Fuel, MD by Molli Posey, ED Scribe. This patient was seen in room A10C/A10C and the patient's care was started 2:05 AM.    Chief Complaint  Patient presents with  . Shortness of Breath   The history is provided by the patient. No language interpreter was used.   HPI Comments: [name] is a 74 y.o. male with a history of A-fib and HTN who presents to the Emergency Department complaining of worsening SOB that started 2 weeks ago. He reports that his ankles have been swelling for the last 2-3 weeks and has been experiencing chest tightness as well. Pt reports his breathing worsens when he lays down. He says that he recently changed his medications. Pt says that he was recently told he had emphysema and COPD but nothing acute. Pt reports no alleviating factors at this time. Pt reports NKDA. He denies CP.    Past Medical History  Diagnosis Date  . A-fib   . Hypertension    History reviewed. No pertinent past surgical history. No family history on file. History  Substance Use Topics  . Smoking status: Never Smoker   . Smokeless tobacco: Not on file  . Alcohol Use: No    Review of Systems  Respiratory: Positive for cough, chest tightness and shortness of breath.   Cardiovascular: Positive for leg swelling. Negative for chest pain.  All other systems reviewed and are negative.  Allergies  Review of patient's allergies indicates no known allergies.  Home Medications   Prior to Admission medications   Not on File   BP 136/58 mmHg  Pulse 117  Temp(Src) 97.4 F (36.3 C) (Oral)  Resp 23  SpO2 95% Physical Exam  Constitutional: He is oriented to person, place, and time. He appears well-developed and well-nourished.  HENT:  Head: Normocephalic and atraumatic.  Eyes: Pupils are equal, round, and reactive to light. Right eye exhibits no discharge. Left eye exhibits no  discharge.  Neck: Normal range of motion. Neck supple. No JVD present.  Cardiovascular: Normal rate and normal heart sounds.   No murmur heard. Heart rhythm is irregular.  Pulmonary/Chest: Effort normal. He has no wheezes. He has rales. He exhibits no tenderness.  Fine rales at both bases.   Abdominal: Soft. Bowel sounds are normal. He exhibits no distension and no mass. There is no tenderness.  Musculoskeletal: Normal range of motion. He exhibits edema.  1+ pitting edema   Lymphadenopathy:    He has no cervical adenopathy.  Neurological: He is alert and oriented to person, place, and time. No cranial nerve deficit. Coordination normal.  Skin: Skin is warm and dry. No rash noted.  Psychiatric: He has a normal mood and affect. His behavior is normal. Thought content normal.  Nursing note and vitals reviewed.   ED Course  Procedures   DIAGNOSTIC STUDIES: Oxygen Saturation is 95% on RA, normal by my interpretation.    COORDINATION OF CARE: 2:13 AM Discussed treatment plan with pt at bedside and pt agreed to plan.   Labs Review Results for orders placed or performed during the hospital encounter of 07/10/14  Comprehensive metabolic panel  Result Value Ref Range   Sodium 136 135 - 145 mmol/L   Potassium 4.4 3.5 - 5.1 mmol/L   Chloride 103 96 - 112 mmol/L   CO2 24 19 - 32 mmol/L   Glucose, Bld 134 (H) 70 -  99 mg/dL   BUN 14 6 - 23 mg/dL   Creatinine, Ser 1.00 0.50 - 1.35 mg/dL   Calcium 9.0 8.4 - 10.5 mg/dL   Total Protein 6.5 6.0 - 8.3 g/dL   Albumin 4.2 3.5 - 5.2 g/dL   AST 79 (H) 0 - 37 U/L   ALT 149 (H) 0 - 53 U/L   Alkaline Phosphatase 66 39 - 117 U/L   Total Bilirubin 1.4 (H) 0.3 - 1.2 mg/dL   GFR calc non Af Amer 73 (L) >90 mL/min   GFR calc Af Amer 85 (L) >90 mL/min   Anion gap 9 5 - 15  Brain natriuretic peptide  Result Value Ref Range   B Natriuretic Peptide 619.0 (H) 0.0 - 100.0 pg/mL  CBC with Differential  Result Value Ref Range   WBC 5.9 4.0 - 10.5 K/uL    RBC 4.58 4.22 - 5.81 MIL/uL   Hemoglobin 13.9 13.0 - 17.0 g/dL   HCT 41.2 39.0 - 52.0 %   MCV 90.0 78.0 - 100.0 fL   MCH 30.3 26.0 - 34.0 pg   MCHC 33.7 30.0 - 36.0 g/dL   RDW 13.1 11.5 - 15.5 %   Platelets 163 150 - 400 K/uL   Neutrophils Relative % 65 43 - 77 %   Neutro Abs 3.8 1.7 - 7.7 K/uL   Lymphocytes Relative 21 12 - 46 %   Lymphs Abs 1.2 0.7 - 4.0 K/uL   Monocytes Relative 13 (H) 3 - 12 %   Monocytes Absolute 0.8 0.1 - 1.0 K/uL   Eosinophils Relative 1 0 - 5 %   Eosinophils Absolute 0.1 0.0 - 0.7 K/uL   Basophils Relative 0 0 - 1 %   Basophils Absolute 0.0 0.0 - 0.1 K/uL  I-Stat Troponin, ED (not at [facility])  Result Value Ref Range   Troponin i, poc 0.01 0.00 - 0.08 ng/mL   Comment 3           Imaging Review Dg Chest Portable 1 View  07/10/2014   CLINICAL DATA:  Shortness of breath and chest discomfort.  EXAM: PORTABLE CHEST - 1 VIEW  COMPARISON:  Remote frontal and lateral views 10/23/2007  FINDINGS: The heart is enlarged. Mild prominence of central pulmonary vasculature without pulmonary edema. Lungs remain hyperinflated. No consolidation, pleural effusion, or pneumothorax. Minimal atelectasis or scarring at the left lung base.  IMPRESSION: 1. Mild cardiomegaly. 2. Hyperinflation with left basilar atelectasis.   Electronically Signed   By: Jeb Levering M.D.   On: 07/10/2014 02:25     EKG Interpretation   Date/Time:  Friday July 10 2014 01:44:38 EST Ventricular Rate:  108 PR Interval:    QRS Duration: 167 QT Interval:  395 QTC Calculation: 529 R Axis:   -71 Text Interpretation:  Atrial fibrillation Ventricular premature complex  RBBB and LAFB Baseline wander in lead(s) V5 When compared with ECG of  05/03/2007, Atrial fibrillation with rapid ventricular response has replaced  Sinus rhythm Right bundle branch block is now Present Confirmed by [facility]   MD, Keiona Jenison ([id number]) on 07/10/2014 1:55:46 AM      MDM   Final diagnoses:  CHF exacerbation  Elevated transaminase  level    CHF exacerbation based on clinical description and presence of peripheral edema. Chest x-ray is obtained which show cardiomegaly but no overt pulmonary edema. BNP is somewhat elevated at over 600. Incidental finding of elevated transaminases could be consistent with right heart failure. He is given dose of furosemide and  has had modest diuresis. Case is discussed with Dr. Terrence Dupont who agrees to admit the patient.   I personally performed the services described in this documentation, which was scribed in my presence. The recorded information has been reviewed and is accurate.       Delora Fuel, MD 89/37/34 [id number]

## 2014-07-10 NOTE — ED Notes (Signed)
Dr. Glick at bedside.  

## 2014-07-10 NOTE — H&P (Signed)
[name] is an 74 y.o. male.   Chief Complaint: Progressive increasing shortness of breath associated with coughing HPI: Patient is 74 year old male with past medical history significant for mild coronary artery disease, hypertension, prediabetic hypercholesterolemia, degenerative joint disease, COPD, chronic atrial fibrillation,  treated with rate control and anticoagulants, came to the ER complaining of progressive increasing shortness of breath associated with coughing for approximately 2 weeks patient was seen at [facility] a few days ago had chest x-ray which was negative showed signs of COPD and was started yesterday on Z-Pak without much improvement so decided to come to the ED for further evaluation. Patient was noted to be in A. fib with moderate ventricular response and mild CHF. Patient received IV Lasix with improvement in his symptoms.  Past Medical History  Diagnosis Date  . A-fib   . Hypertension     History reviewed. No pertinent past surgical history.  No family history on file. Social History:  reports that he has never smoked. He does not have any smokeless tobacco history on file. He reports that he does not drink alcohol. His drug history is not on file.  Allergies: No Known Allergies  Medications Prior to Admission  Medication Sig Dispense Refill  . ADVAIR DISKUS 250-50 MCG/DOSE AEPB Inhale 1 puff into the lungs 2 (two) times daily.     Marland Kitchen apixaban (ELIQUIS) 5 MG TABS tablet Take 5 mg by mouth 2 (two) times daily.    Marland Kitchen atorvastatin (LIPITOR) 20 MG tablet Take 20 mg by mouth daily at 6 PM.     . azithromycin (ZITHROMAX) 250 MG tablet Take 250 mg by mouth daily.     . metoprolol succinate (TOPROL-XL) 50 MG 24 hr tablet Take 100 mg by mouth daily.     Marland Kitchen PROAIR HFA 108 (90 BASE) MCG/ACT inhaler Inhale 1 puff into the lungs every 4 (four) hours as needed for wheezing or shortness of breath.     . SPIRIVA HANDIHALER 18 MCG inhalation capsule Place 18 mcg into  inhaler and inhale daily.     Marland Kitchen spironolactone (ALDACTONE) 25 MG tablet Take 25 mg by mouth daily.       Results for orders placed or performed during the hospital encounter of 07/10/14 (from the past 48 hour(s))  Comprehensive metabolic panel     Status: Abnormal   Collection Time: 07/10/14  1:50 AM  Result Value Ref Range   Sodium 136 135 - 145 mmol/L   Potassium 4.4 3.5 - 5.1 mmol/L   Chloride 103 96 - 112 mmol/L   CO2 24 19 - 32 mmol/L   Glucose, Bld 134 (H) 70 - 99 mg/dL   BUN 14 6 - 23 mg/dL   Creatinine, Ser 1.00 0.50 - 1.35 mg/dL   Calcium 9.0 8.4 - 10.5 mg/dL   Total Protein 6.5 6.0 - 8.3 g/dL   Albumin 4.2 3.5 - 5.2 g/dL   AST 79 (H) 0 - 37 U/L   ALT 149 (H) 0 - 53 U/L   Alkaline Phosphatase 66 39 - 117 U/L   Total Bilirubin 1.4 (H) 0.3 - 1.2 mg/dL   GFR calc non Af Amer 73 (L) >90 mL/min   GFR calc Af Amer 85 (L) >90 mL/min    Comment: (NOTE) The eGFR has been calculated using the CKD EPI equation. This calculation has not been validated in all clinical situations. eGFR's persistently <90 mL/min signify possible Chronic Kidney Disease.    Anion gap 9 5 - 15  Brain natriuretic peptide     Status: Abnormal   Collection Time: 07/10/14  1:50 AM  Result Value Ref Range   B Natriuretic Peptide 619.0 (H) 0.0 - 100.0 pg/mL  CBC with Differential     Status: Abnormal   Collection Time: 07/10/14  1:50 AM  Result Value Ref Range   WBC 5.9 4.0 - 10.5 K/uL   RBC 4.58 4.22 - 5.81 MIL/uL   Hemoglobin 13.9 13.0 - 17.0 g/dL   HCT 41.2 39.0 - 52.0 %   MCV 90.0 78.0 - 100.0 fL   MCH 30.3 26.0 - 34.0 pg   MCHC 33.7 30.0 - 36.0 g/dL   RDW 13.1 11.5 - 15.5 %   Platelets 163 150 - 400 K/uL   Neutrophils Relative % 65 43 - 77 %   Neutro Abs 3.8 1.7 - 7.7 K/uL   Lymphocytes Relative 21 12 - 46 %   Lymphs Abs 1.2 0.7 - 4.0 K/uL   Monocytes Relative 13 (H) 3 - 12 %   Monocytes Absolute 0.8 0.1 - 1.0 K/uL   Eosinophils Relative 1 0 - 5 %   Eosinophils Absolute 0.1 0.0 - 0.7 K/uL    Basophils Relative 0 0 - 1 %   Basophils Absolute 0.0 0.0 - 0.1 K/uL  I-Stat Troponin, ED (not at [facility])     Status: None   Collection Time: 07/10/14  1:59 AM  Result Value Ref Range   Troponin i, poc 0.01 0.00 - 0.08 ng/mL   Comment 3            Comment: Due to the release kinetics of cTnI, a negative result within the first hours of the onset of symptoms does not rule out myocardial infarction with certainty. If myocardial infarction is still suspected, repeat the test at appropriate intervals.    Dg Chest Portable 1 View  07/10/2014   CLINICAL DATA:  Shortness of breath and chest discomfort.  EXAM: PORTABLE CHEST - 1 VIEW  COMPARISON:  Remote frontal and lateral views 10/23/2007  FINDINGS: The heart is enlarged. Mild prominence of central pulmonary vasculature without pulmonary edema. Lungs remain hyperinflated. No consolidation, pleural effusion, or pneumothorax. Minimal atelectasis or scarring at the left lung base.  IMPRESSION: 1. Mild cardiomegaly. 2. Hyperinflation with left basilar atelectasis.   Electronically Signed   By: Jeb Levering M.D.   On: 07/10/2014 02:25    Review of Systems  Constitutional: Negative for fever.  HENT: Negative for hearing loss.   Eyes: Negative for blurred vision, double vision and photophobia.  Respiratory: Positive for cough, sputum production and shortness of breath. Negative for hemoptysis.   Cardiovascular: Positive for leg swelling and PND. Negative for chest pain and palpitations.  Gastrointestinal: Negative for nausea, vomiting and abdominal pain.  Genitourinary: Positive for dysuria.  Neurological: Negative for dizziness and headaches.    Blood pressure 125/84, pulse 97, temperature 97.8 F (36.6 C), temperature source Oral, resp. rate 18, height '6\' 2"'  (1.88 m), weight 90.674 kg (199 lb 14.4 oz), SpO2 99 %. Physical Exam  Constitutional: He is oriented to person, place, and time.  HENT:  Head: Normocephalic and atraumatic.  Eyes:  Conjunctivae are normal. Pupils are equal, round, and reactive to light. Left eye exhibits no discharge. No scleral icterus.  Neck: Normal range of motion. Neck supple. No tracheal deviation present. No thyromegaly present.  Cardiovascular:  Irregularly irregular tachycardic S1 and S2 soft soft S3 gallop noted  Respiratory:  Decreased breath sound at bases with  bibasilar Rales  GI: Soft. Bowel sounds are normal. He exhibits no distension. There is no tenderness.  Musculoskeletal:  No clubbing cyanosis 1+ edema noted  Neurological: He is alert and oriented to person, place, and time.     Assessment/Plan Acute systolic congestive heart failure probably secondary to tachycardia induced A. fib with moderate ventricular response Hypertension Prediabetic Mild CAD COPD Hypercholesteremia Degenerative joint disease Plan As per orders Discussed with patient at length regarding pairs options of treatment i.e. TEE cardioversion versus medical management for now with rate control and chronic anticoagulation and chemical cardioversion in 4 weeks and agrees for medical management for now. Dmarco Baldus N 07/10/2014, 8:06 AM

## 2014-07-10 NOTE — Progress Notes (Signed)
Subjective:  Patient denies any chest pain states breathing has improved. 2-D echo done showed markedly depressed LV systolic function and thoracic aortic aneurysm. Will get CT angiogram of chest and abdomen to further accurately define the size of the aorta.  Objective:  Vital Signs in the last 24 hours: Temp:  [97.4 F (36.3 C)-98 F (36.7 C)] 98 F (36.7 C) (03/11 1500) Pulse Rate:  [92-117] 92 (03/11 1500) Resp:  [15-23] 18 (03/11 1500) BP: (114-147)/(58-96) 122/78 mmHg (03/11 1500) SpO2:  [92 %-99 %] 97 % (03/11 1500) Weight:  [90.674 kg (199 lb 14.4 oz)] 90.674 kg (199 lb 14.4 oz) (03/11 0547)  Intake/Output from previous day: 03/10 0701 - 03/11 0700 In: 240 [P.O.:240] Out: 475 [Urine:475] Intake/Output from this shift: Total I/O In: 522 [P.O.:522] Out: 2775 [Urine:2775]  Physical Exam: Neck: no adenopathy, no carotid bruit, no JVD and supple, symmetrical, trachea midline Lungs: Decreased breath sound at bases air entry improved Heart: irregularly irregular rhythm, S1, S2 normal and Soft systolic murmur noted Abdomen: soft, non-tender; bowel sounds normal; no masses,  no organomegaly Extremities: extremities normal, atraumatic, no cyanosis or edema  Lab Results:  Recent Labs  07/10/14 0150 07/10/14 0928  WBC 5.9 4.6  HGB 13.9 13.9  PLT 163 168    Recent Labs  07/10/14 0150  NA 136  K 4.4  CL 103  CO2 24  GLUCOSE 134*  BUN 14  CREATININE 1.00    Recent Labs  07/10/14 0928 07/10/14 1445  TROPONINI <0.03 <0.03   Hepatic Function Panel  Recent Labs  07/10/14 0150  PROT 6.5  ALBUMIN 4.2  AST 79*  ALT 149*  ALKPHOS 66  BILITOT 1.4*   No results for input(s): CHOL in the last 72 hours. No results for input(s): PROTIME in the last 72 hours.  Imaging: Imaging results have been reviewed and Dg Chest Portable 1 View  07/10/2014   CLINICAL DATA:  Shortness of breath and chest discomfort.  EXAM: PORTABLE CHEST - 1 VIEW  COMPARISON:  Remote frontal  and lateral views 10/23/2007  FINDINGS: The heart is enlarged. Mild prominence of central pulmonary vasculature without pulmonary edema. Lungs remain hyperinflated. No consolidation, pleural effusion, or pneumothorax. Minimal atelectasis or scarring at the left lung base.  IMPRESSION: 1. Mild cardiomegaly. 2. Hyperinflation with left basilar atelectasis.   Electronically Signed   By: Jeb Levering M.D.   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 once CT results available. Dr. Doylene Canard on-call for me for weekend  LOS: 0 days    Georganna Maxson N 07/10/2014, 5:51 PM

## 2014-07-10 NOTE — Care Management Note (Unsigned)
    Page 1 of 1   07/13/2014     2:17:44 PM CARE MANAGEMENT NOTE 07/13/2014  Patient:  [name], [name]   Account Number:  [id number]  Date Initiated:  07/10/2014  Documentation initiated by:  Tiauna Whisnant  Subjective/Objective Assessment:   Pt adm on 07/10/14 with CHF, Afib.  PTA, pt resides at home alone.     Action/Plan:   Will follow for dc needs as pt progresses.   Anticipated DC Date:  07/13/2014   Anticipated DC Plan:  [place]  CM consult      Choice offered to / List presented to:             Status of service:  In process, will continue to follow Medicare Important Message given?  YES (If response is "NO", the following Medicare IM given date fields will be blank) Date Medicare IM given:  07/13/2014 Medicare IM given by:  Adra Shepler Date Additional Medicare IM given:   Additional Medicare IM given by:    Discharge Disposition:    Per UR Regulation:  Reviewed for med. necessity/level of care/duration of stay  If discussed at [place] of Stay Meetings, dates discussed:    Comments:

## 2014-07-11 DIAGNOSIS — I5023 Acute on chronic systolic (congestive) heart failure: Secondary | ICD-10-CM | POA: Diagnosis not present

## 2014-07-11 DIAGNOSIS — I711 Thoracic aortic aneurysm, ruptured: Secondary | ICD-10-CM | POA: Diagnosis not present

## 2014-07-11 DIAGNOSIS — I340 Nonrheumatic mitral (valve) insufficiency: Secondary | ICD-10-CM | POA: Diagnosis not present

## 2014-07-11 DIAGNOSIS — I4891 Unspecified atrial fibrillation: Secondary | ICD-10-CM | POA: Diagnosis not present

## 2014-07-11 DIAGNOSIS — I351 Nonrheumatic aortic (valve) insufficiency: Secondary | ICD-10-CM | POA: Diagnosis not present

## 2014-07-11 LAB — BASIC METABOLIC PANEL
Anion gap: 9 (ref 5–15)
BUN: 12 mg/dL (ref 6–23)
CO2: 25 mmol/L (ref 19–32)
CREATININE: 0.85 mg/dL (ref 0.50–1.35)
Calcium: 9.1 mg/dL (ref 8.4–10.5)
Chloride: 104 mmol/L (ref 96–112)
GFR calc non Af Amer: 85 mL/min — ABNORMAL LOW (ref >90)
Glucose, Bld: 112 mg/dL — ABNORMAL HIGH (ref 70–99)
Potassium: 3.8 mmol/L (ref 3.5–5.1)
SODIUM: 138 mmol/L (ref 135–145)

## 2014-07-11 MED ORDER — METOPROLOL SUCCINATE ER 25 MG PO TB24
25.0000 mg | ORAL_TABLET | Freq: Every day | ORAL | Status: DC
  Administered 2014-07-11: 25 mg via ORAL
  Filled 2014-07-11 (×2): qty 1

## 2014-07-11 NOTE — Consult Note (Addendum)
[address]       [place] [address]             [phone]        [name] [facility] Medical Record #[id number] Date of Birth: Feb 14, 1941  Referring: Dr Terrence Dupont  Primary Care: Clent Demark, MD  Chief Complaint:    Chief Complaint  Patient presents with  . Shortness of Breath    History of Present Illness:     Patient is 74 year old male withhistory significant for mild coronary artery disease cath 2007, hypertension, prediabetic, hypercholesterolemia, degenerative joint disease, COPD, chronic atrial fibrillation, treated with rate control and anticoagulants. He was seen in the  [facility]  [facility] a few days ago, had chest x-ray which was negative showed signs of COPD and was started yesterday on Z-Pak without much improvement. He came  to the [facility] for further evaluation complaining of progressive increasing shortness of breath associated with coughing for approximately 2 weeks.  Patient was noted to be in A. fib with moderate ventricular response and mild CHF. Patient received IV Lasix with improvement in his symptoms. Patient notes are new onset over the past several weeks.   Current Activity/ Functional Status:    Zubrod Score: At the time of surgery this patient's most appropriate activity status/level should be described as: []     0    Normal activity, no symptoms [x]     1    Restricted in physical strenuous activity but ambulatory, able to do out light work []     2    Ambulatory and capable of self care, unable to do work activities, up and about                 more than 50%  Of the time                            []     3    Only limited self care, in bed greater than 50% of waking hours []     4    Completely disabled, no self care, confined to bed or chair []     5    Moribund  Past Medical History  Diagnosis Date  . A-fib   . Hypertension   . Asthma   . CHF (congestive heart failure)   History of GI Bleed  In 1987   Previous  history of bilaterial knee replacement, cholecystectomy, inguinal hernias repairs   History  Smoking status  .  smoker for 4-5 years quit in 1960  Smokeless tobacco  . none    History  Alcohol Use No    History   Social History  . Marital Status: Married    Spouse Name: N/A  . Number of Children: None    Occupational History  . retired from [employer]     No Known Allergies  Current Facility-Administered Medications  Medication Dose Route Frequency Provider Last Rate Last Dose  . 0.9 %  sodium chloride infusion  250 mL Intravenous PRN Charolette Forward, MD      . acetaminophen (TYLENOL) tablet 650 mg  650 mg Oral Q4H PRN Charolette Forward, MD      . albuterol (PROVENTIL) (2.5 MG/3ML) 0.083% nebulizer solution 3 mL  3 mL Inhalation Q4H PRN Charolette Forward, MD      . ALPRAZolam Duanne Moron) tablet 0.25 mg  0.25 mg Oral BID Charolette Forward,  MD   0.25 mg at 07/10/14 2158  . apixaban (ELIQUIS) tablet 5 mg  5 mg Oral BID Charolette Forward, MD   5 mg at 07/10/14 2158  . atorvastatin (LIPITOR) tablet 20 mg  20 mg Oral q1800 Charolette Forward, MD   20 mg at 07/10/14 1701  . azithromycin (ZITHROMAX) tablet 250 mg  250 mg Oral Daily Charolette Forward, MD   250 mg at 07/10/14 0957  . furosemide (LASIX) injection 40 mg  40 mg Intravenous Daily Charolette Forward, MD   40 mg at 07/10/14 0958  . losartan (COZAAR) tablet 25 mg  25 mg Oral Daily Charolette Forward, MD   25 mg at 07/10/14 0957  . metoprolol succinate (TOPROL-XL) 24 hr tablet 50 mg  50 mg Oral Daily Charolette Forward, MD   50 mg at 07/10/14 2158   And  . metoprolol succinate (TOPROL-XL) 24 hr tablet 100 mg  100 mg Oral Daily Charolette Forward, MD      . mometasone-formoterol (DULERA) 100-5 MCG/ACT inhaler 2 puff  2 puff Inhalation BID Charolette Forward, MD   2 puff at 07/10/14 2051  . ondansetron (ZOFRAN) injection 4 mg  4 mg Intravenous Q6H PRN Charolette Forward, MD      . potassium chloride 20 MEQ/15ML (10%) solution 20 mEq  20 mEq Oral Daily Charolette Forward, MD   20  mEq at 07/10/14 1002  . sodium chloride 0.9 % injection 3 mL  3 mL Intravenous Q12H Charolette Forward, MD   3 mL at 07/10/14 2159  . sodium chloride 0.9 % injection 3 mL  3 mL Intravenous PRN Charolette Forward, MD      . spironolactone (ALDACTONE) tablet 25 mg  25 mg Oral Daily Charolette Forward, MD   25 mg at 07/10/14 0957  . tiotropium (SPIRIVA) inhalation capsule 18 mcg  18 mcg Inhalation Daily Charolette Forward, MD   18 mcg at 07/11/14 0076    Prescriptions prior to admission  Medication Sig Dispense Refill Last Dose  . ADVAIR DISKUS 250-50 MCG/DOSE AEPB Inhale 1 puff into the lungs 2 (two) times daily.    07/09/2014 at Unknown time  . apixaban (ELIQUIS) 5 MG TABS tablet Take 5 mg by mouth 2 (two) times daily.   07/09/2014 at Unknown time  . atorvastatin (LIPITOR) 20 MG tablet Take 20 mg by mouth daily at 6 PM.    07/09/2014 at Unknown time  . azithromycin (ZITHROMAX) 250 MG tablet Take 250 mg by mouth daily.    07/09/2014 at Unknown time  . metoprolol succinate (TOPROL-XL) 50 MG 24 hr tablet Take 100 mg by mouth daily.    07/09/2014 at 0800  . PROAIR HFA 108 (90 BASE) MCG/ACT inhaler Inhale 1 puff into the lungs every 4 (four) hours as needed for wheezing or shortness of breath.    07/09/2014 at Unknown time  . SPIRIVA HANDIHALER 18 MCG inhalation capsule Place 18 mcg into inhaler and inhale daily.    07/09/2014 at Unknown time  . spironolactone (ALDACTONE) 25 MG tablet Take 25 mg by mouth daily.    07/09/2014 at Unknown time    Family History: Only child , no children, father died suddenly at home at age 14 , ? MI  No family history of dissection or aortic any serum   Review of Systems:      Cardiac Review of Systems: Y or N  Chest Pain [  n  ]  Resting SOB [ y  ] Exertional SOB  [ y ]  Orthopnea Blue.Reese  ]   Pedal Edema Blue.Reese   ]    Palpitations Blue.Reese  ] Syncope  [ n ]   Presyncope [  y ]  General Review of Systems: [Y] = yes [  ]=no Constitional: recent weight change Vixen.Miu  ]; anorexia [  ]; fatigue Blue.Reese  ]; nausea  [  ]; night sweats [  ]; fever [n  ]; or chills [ n ]                                                               Dental: poor dentition[ y ]; Last Dentist visit:  Has upper plates and lower implants   Eye : blurred vision [ n ]; diplopia [   ]; vision changes [  ];  Amaurosis fugax[  ]; Resp: cough Blue.Reese  ];  wheezing[ y ];  hemoptysis[ n ]; shortness of breath[ y ]; paroxysmal nocturnal dyspnea[  ]; dyspnea on exertion[y  ]; or orthopnea[y  ];  GI:  gallstones[  ], vomiting[  ];  dysphagia[  ]; melena[  ];  hematochezia [  ]; heartburn[  ];   Hx of  Colonoscopy[y  ]; GU: kidney stones [  ]; hematuria[ n ];   dysuria [  ];  nocturia[  ];  history of     obstruction [  ]; urinary frequency [ n ]             Skin: rash, swelling[  ];, hair loss[  ];  peripheral edema[  ];  or itching[  ]; Musculosketetal: myalgias[  ];  joint swelling[ y ];  joint erythema[ y ];  joint pain[  ];  back pain[  ];  Heme/Lymph: bruising[  ];  bleeding[  ];  anemia[  ];  Neuro: TIA[  ];  headaches[  ];  stroke[  ];  vertigo[  ];  seizures[ n ];   paresthesias[  ];  difficulty walking[ n ];  Psych:depression[  ]; anxiety[  ];  Endocrine: diabetes[n  ];  thyroid dysfunction[ n ];  Immunizations: Flu Florencio.Farrier  ]; Pneumococcal[  n];  Other:  Physical Exam: BP 115/57 mmHg  Pulse 99  Temp(Src) 97.7 F (36.5 C) (Oral)  Resp 18  Ht 6\' 2"  (1.88 m)  Wt 191 lb 3.2 oz (86.728 kg)  BMI 24.54 kg/m2  SpO2 98%   General appearance: alert, cooperative, appears older than stated age and no distress Head: Normocephalic, without obvious abnormality, atraumatic Neck: no adenopathy, no carotid bruit, no JVD, supple, symmetrical, trachea midline and thyroid not enlarged, symmetric, no tenderness/mass/nodules Lymph nodes: Cervical, supraclavicular, and axillary nodes normal. Resp: clear to auscultation bilaterally Back: symmetric, no curvature. ROM normal. No CVA tenderness. Cardio: irregularly irregular rhythm GI: soft, non-tender;  bowel sounds normal; no masses,  no organomegaly Extremities: extremities normal, atraumatic, no cyanosis or edema and Homans sign is negative, no sign of DVT Neurologic: Grossly normal No carotid bruits Palpable dp and pt pulses, bilateral bilateral knee incisions  Diagnostic Studies & Laboratory data:     Recent Radiology Findings:   Ct Angio Chest Pe W/cm &/or Wo Cm  07/10/2014   CLINICAL DATA:  74 year old male with increasing shortness of breath for the past 2 weeks, cough, chest tightness and shortness of  breath, which worsens when lying flat. History of atrial fibrillation. History of thoracic aortic aneurysm.  EXAM: CT ANGIOGRAPHY CHEST, ABDOMEN AND PELVIS  TECHNIQUE: Multidetector CT imaging through the chest, abdomen and pelvis was performed using the standard protocol during bolus administration of intravenous contrast. Multiplanar reconstructed images and MIPs were obtained and reviewed to evaluate the vascular anatomy.  CONTRAST:  171mL OMNIPAQUE IOHEXOL 350 MG/ML SOLN  COMPARISON:  None.  FINDINGS: CTA CHEST FINDINGS  Mediastinum/Lymph Nodes: Aneurysmal dilatation of the aortic root which measures 3.2 cm in diameter at the annulus, but 5.7 cm in diameter at the level of the sinuses of Valsalva. The sino-tubular junction is effaced. The thoracic aortic arch is normal in caliber at 2.8 cm. The isthmus of the thoracic aorta is mildly ectatic measuring 4 cm in diameter. The descending thoracic aorta is normal in caliber at 3 cm in diameter. No evidence of thoracic aortic dissection. Heart size is mildly enlarged. There is no significant pericardial fluid, thickening or pericardial calcification. There is atherosclerosis of the thoracic aorta, the great vessels of the mediastinum and the coronary arteries, including calcified atherosclerotic plaque in the left main and left anterior descending coronary arteries. Multiple borderline enlarged and mildly enlarged mediastinal and right hilar lymph  nodes, largest of which is in the low right paratracheal nodal station measuring 14 mm in short axis. Multiple densely calcified right hilar and mediastinal lymph nodes, presumably from old granulomatous disease. Esophagus is normal in appearance. Multiple borderline enlarged and mildly enlarged left axillary lymph nodes measuring up to 10 mm in short axis.  Lungs/Pleura: Large calcified granuloma in the posterior aspect of the right lower lobe with adjacent scarring. 12 x 7 mm right upper lobe pulmonary nodule (image 29 of series 506). No acute consolidative airspace disease. No pleural effusions. Diffuse subpleural bullae are noted in the apices of the lungs bilaterally. Small left-sided Bochdalek's hernia.  Musculoskeletal/Soft Tissues: There are no aggressive appearing lytic or blastic lesions noted in the visualized portions of the skeleton.  Review of the MIP images confirms the above findings.  CTA ABDOMEN AND PELVIS FINDINGS  Hepatobiliary: Status post cholecystectomy. 8 mm low attenuation lesion in segment 6 of the liver (image 180 of series 501), too small to characterize, but statistically likely a tiny cyst. No other suspicious appearing hepatic lesions. No intra or extrahepatic biliary ductal dilatation.  Pancreas: Unremarkable.  Spleen: Multiple calcifications in the spleen, and irregular splenic contour, which could relate to remote trauma or prior splenic infarcts.  Adrenals/Urinary Tract: Bilateral kidneys and bilateral adrenal glands are normal in appearance. No hydroureteronephrosis. Urinary bladder is normal in appearance.  Stomach/Bowel: Who normal appearance of the stomach. No pathologic dilatation of small bowel or colon. Normal appendix.  Vascular/Lymphatic: Atherosclerosis throughout the abdominal and pelvic vasculature. Aneurysmal dilatation of the common iliac arteries bilaterally which measure up to 1.6 cm in diameter bilaterally. No evidence of dissection. No lymphadenopathy noted in the  abdomen or pelvis.  Reproductive: Prostate gland is enlarged and heterogeneous in appearance measuring 6.3 x 4.1 cm. Seminal vesicles are unremarkable in appearance.  Other: No significant volume of ascites.  No pneumoperitoneum.  Musculoskeletal: There are no aggressive appearing lytic or blastic lesions noted in the visualized portions of the skeleton.  Review of the MIP images confirms the above findings.  IMPRESSION: 1. Aneurysmal dilatation of the aortic root, which measures up to 5.7 cm in diameter at the level of the sinuses of Valsalva. There is effacement of the sino-tubular junction.  This is commonly seen in the setting of Marfan syndrome. No evidence of aortic dissection at this time. There is also some ectasia at of the isthmus of the thoracic aorta which measures up to 4 cm in diameter. 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 2. 12 x 7 mm right upper lobe pulmonary nodule (image 29 of series 506). Followup evaluation with PET-CT is recommended in the near future to evaluate for potential malignancy. 3. Left main and left anterior descending coronary artery disease. Assessment for potential risk factor modification, dietary therapy or pharmacologic therapy may be warranted, if clinically indicated. 4. Mild cardiomegaly. 56. Sequela of old granulomatous disease, as above. Multiple borderline enlarged and mildly enlarged mediastinal and right hilar lymph nodes that are noncalcified are also noted, as well as borderline enlarged and minimally enlarged left axillary lymph nodes. These are nonspecific, but clinical correlation to exclude the possibility of underlying lymphoproliferative disorder may be appropriate. 6. Additional incidental findings, as above.   Electronically Signed   By: Vinnie Langton M.D.   On: 07/10/2014 21:47   Dg Chest Portable 1 View  07/10/2014   CLINICAL DATA:  Shortness of breath and chest  discomfort.  EXAM: PORTABLE CHEST - 1 VIEW  COMPARISON:  Remote frontal and lateral views 10/23/2007  FINDINGS: The heart is enlarged. Mild prominence of central pulmonary vasculature without pulmonary edema. Lungs remain hyperinflated. No consolidation, pleural effusion, or pneumothorax. Minimal atelectasis or scarring at the left lung base.  IMPRESSION: 1. Mild cardiomegaly. 2. Hyperinflation with left basilar atelectasis.   Electronically Signed   By: Jeb Levering M.D.   On: 07/10/2014 02:25     I have independently reviewed the above radiologic studies.  Recent Lab Findings: Lab Results  Component Value Date   WBC 4.6 07/10/2014   HGB 13.9 07/10/2014   HCT 41.8 07/10/2014   PLT 168 07/10/2014   GLUCOSE 112* 07/11/2014   ALT 149* 07/10/2014   AST 79* 07/10/2014   NA 138 07/11/2014   K 3.8 07/11/2014   CL 104 07/11/2014   CREATININE 0.85 07/11/2014   BUN 12 07/11/2014   CO2 25 07/11/2014   INR 1.1 10/16/2007   NO recent Cardiac CATH: TTE:  EF 25-30 %, LV ID, ED, PLAX chordal     (H)   67.9 mm   Echocardiography  Patient:  [name], [name] MR #:    [id number] Study Date: 07/10/2014 Gender:   M Age:    69 Height:   188 cm Weight:   90.3 kg BSA:    2.18 m^2 Pt. Status: Room:    3E02C  ADMITTING  Charolette Forward, MD ATTENDING  Charolette Forward, MD ORDERING   Charolette Forward, MD PERFORMING  Charolette Forward, MD REFERRING  Charolette Forward, MD SONOGRAPHER Delman Kitten, RCS  cc:  ------------------------------------------------------------------- LV EF: 25% -  30%  ------------------------------------------------------------------- Indications:   Atrial fibrillation - 427.31. CHF - 428.0.  ------------------------------------------------------------------- History:  PMH:  Chronic obstructive pulmonary disease. Risk factors: Dyslipidemia.  ------------------------------------------------------------------- Study  Conclusions  - Left ventricle: The cavity size was moderately dilated. Systolic function was severely reduced. The estimated ejection fraction was in the range of 25% to 30%. Diffuse hypokinesis. - Aortic valve: There was mild regurgitation. - Left atrium: The atrium was mildly dilated. - Atrial septum: No defect or patent foramen ovale was identified. - Pulmonary arteries: Systolic pressure was mildly to moderately increased. PA peak pressure:  32 mm Hg (S).  Echocardiography. M-mode, complete 2D, spectral Doppler, and color Doppler. Birthdate: Patient birthdate: 1941-04-06. Age: Patient is 74 yr old. Sex: Gender: male.  BMI: 25.6 kg/m^2. Blood pressure:   128/78 Patient status: Inpatient. Study date: Study date: 07/10/2014. Study time: 01:10 PM. Location: Echo laboratory.  -------------------------------------------------------------------  ------------------------------------------------------------------- Left ventricle: The cavity size was moderately dilated. Systolic function was severely reduced. The estimated ejection fraction was in the range of 25% to 30%. Diffuse hypokinesis.  ------------------------------------------------------------------- Aortic valve:  Trileaflet; mildly thickened leaflets. Doppler: There was mild regurgitation.  ------------------------------------------------------------------- Aorta: The aorta was moderately dilated. There was no evidence for dissection. Ascending aorta: The ascending aorta was normal in size.  ------------------------------------------------------------------- Mitral valve:  Structurally normal valve.  Leaflet separation was normal. Doppler: Transvalvular velocity was within the normal range. There was no evidence for stenosis. There was trivial regurgitation.  ------------------------------------------------------------------- Left atrium: The atrium was mildly  dilated.  ------------------------------------------------------------------- Atrial septum: No defect or patent foramen ovale was identified.  ------------------------------------------------------------------- Right ventricle: The cavity size was normal. Wall thickness was normal. Systolic function was normal.  ------------------------------------------------------------------- Pulmonic valve:  Structurally normal valve.  Cusp separation was normal. Doppler: Transvalvular velocity was within the normal range. There was no regurgitation.  ------------------------------------------------------------------- Tricuspid valve:  Structurally normal valve.  Leaflet separation was normal. Doppler: Transvalvular velocity was within the normal range. There was mild regurgitation.  ------------------------------------------------------------------- Pulmonary artery:  Systolic pressure was mildly to moderately increased.  ------------------------------------------------------------------- Right atrium: The atrium was normal in size.  ------------------------------------------------------------------- Pericardium: There was no pericardial effusion.  ------------------------------------------------------------------- Post procedure conclusions Ascending Aorta:  - The aorta was moderately dilated.  ------------------------------------------------------------------- Measurements  Left ventricle              Value    Reference LV ID, ED, PLAX chordal     (H)   67.9 mm   43 - 52 LV ID, ES, PLAX chordal     (H)   61.9 mm   23 - 38 LV fx shortening, PLAX chordal  (L)   9   %   >=29 LV PW thickness, ED           11.1 mm   --------- IVS/LV PW ratio, ED           0.98     <=1.3  Ventricular septum            Value    Reference IVS thickness, ED            10.9 mm    ---------  LVOT                   Value    Reference LVOT ID, S                26  mm   --------- LVOT area                5.31 cm^2  ---------  Aortic valve               Value    Reference Aortic regurg pressure half-time     295  ms   ---------  Aorta                  Value    Reference Aortic root ID, ED            48  mm   ---------  Left atrium  Value    Reference LA ID, A-P, ES              47  mm   --------- LA ID/bsa, A-P              2.16 cm/m^2 <=2.2 LA volume, ES, 1-p A4C          56.5 ml   --------- LA volume/bsa, ES, 1-p A4C        25.9 ml/m^2 --------- LA volume, ES, 1-p A2C          96.7 ml   --------- LA volume/bsa, ES, 1-p A2C        44.4 ml/m^2 ---------  Pulmonary arteries            Value    Reference PA pressure, S, DP        (H)   32  mm Hg <=30  Tricuspid valve             Value    Reference Tricuspid regurg peak velocity      244  cm/s  --------- Tricuspid peak RV-RA gradient      24  mm Hg ---------  Systemic veins              Value    Reference Estimated CVP              8   mm Hg ---------  Right ventricle             Value    Reference RV pressure, S, DP        (H)   32  mm Hg <=30  Legend: (L) and (H) mark values outside specified reference range.  ------------------------------------------------------------------- Prepared and Electronically Authenticated by  Charolette Forward, MD 2016-03-11T17:23:24  Assessment / Plan:    1/  Aneurysmal dilatation of the aortic root, which measures up to 5.7 cm in diameter at the level of the sinuses of Valsalva.   No evidence of aortic  dissection at this time. 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 2/aneurysmal dilatation of the common iliac arteries bilaterally which measure up to 1.6 cm in diameter 3/ 12 x 7 mm right upper lobe pulmonary nodule- in patient with history of smoking suspicious  for lung maigancy 4/ LV dysfunction with symptomatic heart failure , depressed EF 25% and dilated cardiomyopathy with LV  ED dia  67.9  5/Left main and left anterior descending coronary artery calcification on CT. 6/ AFib 7/ COPD- no PFT yet  Recommend: PET scan and PFT to evauate for COPD and right lung nodule my office will arrange TEE to evaluate degree of AI, which with dilated LV and depressed lv function may be more severe   the indicated by TTE Will need cardiac cath before consideration for aortic/valve surgery I will see in office after above to evaluate for root replacement and possible AVR and treatment of right lung nodule  I  spent 60 minutes counseling the patient face to face and 50% or more the  time was spent in counseling and coordination of care. The total time spent in the appointment was 80 minutes.    Grace Isaac MD      [address] [place] [address] Office [phone]   Beeper [phone]  07/11/2014 10:40 AM

## 2014-07-11 NOTE — Progress Notes (Signed)
UR completed 

## 2014-07-11 NOTE — Progress Notes (Signed)
Ref: Mark Demark, MD   Subjective:  Patient made aware of 5.7 cm aortic root. He is also aware of pulmonary nodule in r lung. Ambulated well. Agrees to Cardiothoracic consult, TEE, cardiac cath and Pet scan. Afebrile. Breathing better. Ambulates well post 3.5 litre of negative fluid balance. Low blood pressure.   Objective:  Vital Signs in the last 24 hours: Temp:  [97.5 F (36.4 C)-97.7 F (36.5 C)] 97.6 F (36.4 C) (03/12 1300) Pulse Rate:  [62-99] 81 (03/12 1753) Cardiac Rhythm:  [-] Atrial fibrillation (03/12 0754) Resp:  [16-20] 20 (03/12 1300) BP: (93-118)/(51-79) 102/61 mmHg (03/12 1753) SpO2:  [94 %-100 %] 100 % (03/12 1300) Weight:  [86.728 kg (191 lb 3.2 oz)] 86.728 kg (191 lb 3.2 oz) (03/12 0530)  Physical Exam: BP Readings from Last 1 Encounters:  07/11/14 102/61    Wt Readings from Last 1 Encounters:  07/11/14 86.728 kg (191 lb 3.2 oz)    Weight change: -3.946 kg (-8 lb 11.2 oz)  HEENT: [place]/AT, Eyes- PERL, EOMI, Conjunctiva-Pink, Sclera-Non-icteric Neck: No JVD, No bruit, Trachea midline. Lungs:  Clearong, Bilateral. Cardiac:  Regular rhythm, normal S1 and S2, no S3. II/VI systolic and diastolic murmur. Abdomen:  Soft, non-tender. Extremities:  No edema present. No cyanosis. No clubbing. CNS: AxOx3, Cranial nerves grossly intact, moves all 4 extremities.  Skin: Warm and dry.   Intake/Output from previous day: 03/11 0701 - 03/12 0700 In: 1362 [P.O.:1362] Out: 4950 [Urine:4950]    Lab Results: BMET    Component Value Date/Time   NA 138 07/11/2014 0453   NA 136 07/10/2014 0150   NA 135 10/24/2007 0450   K 3.8 07/11/2014 0453   K 4.4 07/10/2014 0150   K 4.2 10/24/2007 0450   CL 104 07/11/2014 0453   CL 103 07/10/2014 0150   CL 100 10/24/2007 0450   CO2 25 07/11/2014 0453   CO2 24 07/10/2014 0150   CO2 28 10/24/2007 0450   GLUCOSE 112* 07/11/2014 0453   GLUCOSE 134* 07/10/2014 0150   GLUCOSE 174* 10/24/2007 0450   BUN 12 07/11/2014 0453   BUN  14 07/10/2014 0150   BUN 10 10/24/2007 0450   CREATININE 0.85 07/11/2014 0453   CREATININE 1.00 07/10/2014 0150   CREATININE 0.94 10/24/2007 0450   CALCIUM 9.1 07/11/2014 0453   CALCIUM 9.0 07/10/2014 0150   CALCIUM 8.5 10/24/2007 0450   GFRNONAA 85* 07/11/2014 0453   GFRNONAA 73* 07/10/2014 0150   GFRNONAA >60 10/24/2007 0450   GFRAA >90 07/11/2014 0453   GFRAA 85* 07/10/2014 0150   GFRAA  10/24/2007 0450    >60        The eGFR has been calculated using the MDRD equation. This calculation has not been validated in all clinical   CBC    Component Value Date/Time   WBC 4.6 07/10/2014 0928   RBC 4.64 07/10/2014 0928   HGB 13.9 07/10/2014 0928   HCT 41.8 07/10/2014 0928   PLT 168 07/10/2014 0928   MCV 90.1 07/10/2014 0928   MCH 30.0 07/10/2014 0928   MCHC 33.3 07/10/2014 0928   RDW 13.2 07/10/2014 0928   LYMPHSABS 1.4 07/10/2014 0928   MONOABS 0.5 07/10/2014 0928   EOSABS 0.1 07/10/2014 0928   BASOSABS 0.0 07/10/2014 0928   HEPATIC Function Panel  Recent Labs  07/10/14 0150  PROT 6.5   HEMOGLOBIN A1C No components found for: HGA1C,  MPG CARDIAC ENZYMES Lab Results  Component Value Date   TROPONINI <0.03 07/10/2014   TROPONINI <  0.03 07/10/2014   TROPONINI <0.03 07/10/2014   BNP No results for input(s): PROBNP in the last 8760 hours. TSH No results for input(s): TSH in the last 8760 hours. CHOLESTEROL No results for input(s): CHOL in the last 8760 hours.  Scheduled Meds: . ALPRAZolam  0.25 mg Oral BID  . apixaban  5 mg Oral BID  . atorvastatin  20 mg Oral q1800  . azithromycin  250 mg Oral Daily  . losartan  25 mg Oral Daily  . metoprolol succinate  25 mg Oral Daily  . mometasone-formoterol  2 puff Inhalation BID  . potassium chloride  20 mEq Oral Daily  . sodium chloride  3 mL Intravenous Q12H  . spironolactone  25 mg Oral Daily  . tiotropium  18 mcg Inhalation Daily   Continuous Infusions:  PRN Meds:.sodium chloride, acetaminophen, albuterol,  ondansetron (ZOFRAN) IV, sodium chloride  Assessment/Plan:  Dilated aortic root with Aortic insufficiency Right lung nodule. Acute systolic congestive heart failure probably secondary to tachycardia induced A. fib with moderate ventricular response Hypertension Prediabetic Mild CAD COPD Hypercholesteremia Degenerative joint disease   CVTS consult. Decrease antihypertensives doses as needed..   LOS: 1 day    Dixie Dials  MD  07/11/2014, 5:58 PM

## 2014-07-12 DIAGNOSIS — I340 Nonrheumatic mitral (valve) insufficiency: Secondary | ICD-10-CM | POA: Diagnosis not present

## 2014-07-12 DIAGNOSIS — I5023 Acute on chronic systolic (congestive) heart failure: Secondary | ICD-10-CM | POA: Diagnosis not present

## 2014-07-12 DIAGNOSIS — I351 Nonrheumatic aortic (valve) insufficiency: Secondary | ICD-10-CM | POA: Diagnosis not present

## 2014-07-12 DIAGNOSIS — I4891 Unspecified atrial fibrillation: Secondary | ICD-10-CM | POA: Diagnosis not present

## 2014-07-12 LAB — BASIC METABOLIC PANEL
Anion gap: 10 (ref 5–15)
BUN: 17 mg/dL (ref 6–23)
CO2: 26 mmol/L (ref 19–32)
Calcium: 9.4 mg/dL (ref 8.4–10.5)
Chloride: 100 mmol/L (ref 96–112)
Creatinine, Ser: 1.04 mg/dL (ref 0.50–1.35)
GFR calc non Af Amer: 70 mL/min — ABNORMAL LOW (ref >90)
GFR, EST AFRICAN AMERICAN: 81 mL/min — AB (ref >90)
Glucose, Bld: 133 mg/dL — ABNORMAL HIGH (ref 70–99)
Potassium: 3.7 mmol/L (ref 3.5–5.1)
Sodium: 136 mmol/L (ref 135–145)

## 2014-07-12 LAB — HEPARIN LEVEL (UNFRACTIONATED): Heparin Unfractionated: 1.5 IU/mL — ABNORMAL HIGH (ref 0.30–0.70)

## 2014-07-12 LAB — APTT: aPTT: 49 seconds — ABNORMAL HIGH (ref 24–37)

## 2014-07-12 MED ORDER — CARVEDILOL 3.125 MG PO TABS
3.1250 mg | ORAL_TABLET | Freq: Two times a day (BID) | ORAL | Status: DC
  Administered 2014-07-12 – 2014-07-15 (×6): 3.125 mg via ORAL
  Filled 2014-07-12 (×9): qty 1

## 2014-07-12 MED ORDER — SODIUM CHLORIDE 0.9 % IV SOLN
250.0000 mL | INTRAVENOUS | Status: DC | PRN
  Administered 2014-07-13: 09:00:00 via INTRAVENOUS

## 2014-07-12 MED ORDER — POTASSIUM CHLORIDE 20 MEQ/15ML (10%) PO SOLN
20.0000 meq | Freq: Two times a day (BID) | ORAL | Status: DC
  Administered 2014-07-12 – 2014-07-14 (×3): 20 meq via ORAL
  Filled 2014-07-12 (×5): qty 15

## 2014-07-12 MED ORDER — HEPARIN (PORCINE) IN NACL 100-0.45 UNIT/ML-% IJ SOLN
1300.0000 [IU]/h | INTRAMUSCULAR | Status: DC
  Administered 2014-07-12 – 2014-07-14 (×3): 1400 [IU]/h via INTRAVENOUS
  Filled 2014-07-12 (×4): qty 250

## 2014-07-12 MED ORDER — SODIUM CHLORIDE 0.9 % IV SOLN
INTRAVENOUS | Status: DC
  Administered 2014-07-12: via INTRAVENOUS

## 2014-07-12 MED ORDER — HEPARIN (PORCINE) IN NACL 100-0.45 UNIT/ML-% IJ SOLN
1200.0000 [IU]/h | INTRAMUSCULAR | Status: DC
  Administered 2014-07-12: 1200 [IU]/h via INTRAVENOUS
  Filled 2014-07-12 (×2): qty 250

## 2014-07-12 NOTE — Progress Notes (Signed)
Patient's HR up to 140s in A-fib when up brushing teeth.  Patient asymptomatic.  When patient returned to bed, HR at baseline, maintaining 100-110s.  Will continue to monitor.

## 2014-07-12 NOTE — Progress Notes (Signed)
Ref: Clent Demark, MD   Subjective:  Low blood pressure. Ambulating without chest pain. Afebrile.  Objective:  Vital Signs in the last 24 hours: Temp:  [97.3 F (36.3 C)-97.9 F (36.6 C)] 97.5 F (36.4 C) (03/13 0900) Pulse Rate:  [81-100] 90 (03/13 0900) Cardiac Rhythm:  [-] Atrial fibrillation (03/12 2011) Resp:  [18-30] 20 (03/13 0900) BP: (81-110)/(39-74) 97/39 mmHg (03/13 0900) SpO2:  [94 %-100 %] 96 % (03/13 0938) Weight:  [86.5 kg (190 lb 11.2 oz)] 86.5 kg (190 lb 11.2 oz) (03/13 0536)  Physical Exam: BP Readings from Last 1 Encounters:  07/12/14 97/39    Wt Readings from Last 1 Encounters:  07/12/14 86.5 kg (190 lb 11.2 oz)    Weight change: -0.228 kg (-8 oz)  HEENT: [place]/AT, Eyes- PERL, EOMI, Conjunctiva-Pink, Sclera-Non-icteric Neck: No JVD, No bruit, Trachea midline. Lungs:  Clear, Bilateral. Cardiac: Irregular rhythm, normal S1 and S2, no S3. II/VI systolic and diastolic murmur. Abdomen:  Soft, non-tender. Extremities:  No edema present. No cyanosis. No clubbing. CNS: AxOx3, Cranial nerves grossly intact, moves all 4 extremities. Right handed. Skin: Warm and dry.   Intake/Output from previous day: 03/12 0701 - 03/13 0700 In: 1563 [P.O.:1560; I.V.:3] Out: 2100 [Urine:2100]    Lab Results: BMET    Component Value Date/Time   NA 136 07/12/2014 0555   NA 138 07/11/2014 0453   NA 136 07/10/2014 0150   K 3.7 07/12/2014 0555   K 3.8 07/11/2014 0453   K 4.4 07/10/2014 0150   CL 100 07/12/2014 0555   CL 104 07/11/2014 0453   CL 103 07/10/2014 0150   CO2 26 07/12/2014 0555   CO2 25 07/11/2014 0453   CO2 24 07/10/2014 0150   GLUCOSE 133* 07/12/2014 0555   GLUCOSE 112* 07/11/2014 0453   GLUCOSE 134* 07/10/2014 0150   BUN 17 07/12/2014 0555   BUN 12 07/11/2014 0453   BUN 14 07/10/2014 0150   CREATININE 1.04 07/12/2014 0555   CREATININE 0.85 07/11/2014 0453   CREATININE 1.00 07/10/2014 0150   CALCIUM 9.4 07/12/2014 0555   CALCIUM 9.1 07/11/2014 0453    CALCIUM 9.0 07/10/2014 0150   GFRNONAA 70* 07/12/2014 0555   GFRNONAA 85* 07/11/2014 0453   GFRNONAA 73* 07/10/2014 0150   GFRAA 81* 07/12/2014 0555   GFRAA >90 07/11/2014 0453   GFRAA 85* 07/10/2014 0150   CBC    Component Value Date/Time   WBC 4.6 07/10/2014 0928   RBC 4.64 07/10/2014 0928   HGB 13.9 07/10/2014 0928   HCT 41.8 07/10/2014 0928   PLT 168 07/10/2014 0928   MCV 90.1 07/10/2014 0928   MCH 30.0 07/10/2014 0928   MCHC 33.3 07/10/2014 0928   RDW 13.2 07/10/2014 0928   LYMPHSABS 1.4 07/10/2014 0928   MONOABS 0.5 07/10/2014 0928   EOSABS 0.1 07/10/2014 0928   BASOSABS 0.0 07/10/2014 0928   HEPATIC Function Panel  Recent Labs  07/10/14 0150  PROT 6.5   HEMOGLOBIN A1C No components found for: HGA1C,  MPG CARDIAC ENZYMES Lab Results  Component Value Date   TROPONINI <0.03 07/10/2014   TROPONINI <0.03 07/10/2014   TROPONINI <0.03 07/10/2014   BNP No results for input(s): PROBNP in the last 8760 hours. TSH No results for input(s): TSH in the last 8760 hours. CHOLESTEROL No results for input(s): CHOL in the last 8760 hours.  Scheduled Meds: . ALPRAZolam  0.25 mg Oral BID  . atorvastatin  20 mg Oral q1800  . azithromycin  250 mg Oral Daily  .  carvedilol  3.125 mg Oral BID WC  . mometasone-formoterol  2 puff Inhalation BID  . potassium chloride  20 mEq Oral BID  . sodium chloride  3 mL Intravenous Q12H  . spironolactone  25 mg Oral Daily  . tiotropium  18 mcg Inhalation Daily   Continuous Infusions: . heparin     PRN Meds:.sodium chloride, acetaminophen, albuterol, ondansetron (ZOFRAN) IV, sodium chloride  Assessment/Plan: Dilated aortic root with Aortic insufficiency Right lung nodule. Acute systolic congestive heart failure probably secondary to tachycardia A. fib with moderate ventricular response Hypertension Prediabetic Mild CAD COPD Hypercholesteremia Degenerative joint disease   TEE with cardioversion in AM. Cardiac cath on  Tuesday AM.     LOS: 2 days    Dixie Dials  MD  07/12/2014, 12:59 PM

## 2014-07-12 NOTE — Anesthesia Preprocedure Evaluation (Addendum)
Anesthesia Evaluation  Patient identified by MRN, date of birth, ID band Patient awake    Reviewed: Allergy & Precautions, NPO status , Patient's Chart, lab work & pertinent test results  Airway Mallampati: II  TM Distance: <3 FB Neck ROM: Full    Dental  (+) Partial Lower, Edentulous Upper, Dental Advisory Given   Pulmonary asthma (inhalers) ,  breath sounds clear to auscultation        Cardiovascular hypertension, Pt. on medications Rhythm:Irregular Rate:Tachycardia  ECHO 07/09/2013 EF 25-30%, diffuse hypokin   Neuro/Psych    GI/Hepatic negative GI ROS, Neg liver ROS,   Endo/Other  negative endocrine ROS  Renal/GU negative Renal ROS     Musculoskeletal   Abdominal (+)  Abdomen: soft.    Peds  Hematology negative hematology ROS (+)   Anesthesia Other Findings   Reproductive/Obstetrics                           Anesthesia Physical Anesthesia Plan  ASA: III  Anesthesia Plan: MAC   Post-op Pain Management:    Induction: Intravenous  Airway Management Planned: Nasal Cannula  Additional Equipment:   Intra-op Plan:   Post-operative Plan:   Informed Consent: I have reviewed the patients History and Physical, chart, labs and discussed the procedure including the risks, benefits and alternatives for the proposed anesthesia with the patient or authorized representative who has indicated his/her understanding and acceptance.     Plan Discussed with:   Anesthesia Plan Comments:         Anesthesia Quick Evaluation

## 2014-07-12 NOTE — Progress Notes (Signed)
Utilization review completed.  P.J. Ignatius Kloos,RN,BSN Case Manager 

## 2014-07-12 NOTE — Consult Note (Addendum)
PHARMACY CONSULT NOTE  Pharmacy Consult :  Heparin  Indication : atrial fibrillation   Allergies: No Known Allergies  Heparin Dosing weight : 87 kg  Vital Signs: BP 97/39 mmHg  Pulse 90  Temp(Src) 97.5 F (36.4 C) (Oral)  Resp 20  Ht 6\' 2"  (1.88 m)  Wt 190 lb 11.2 oz (86.5 kg)  BMI 24.47 kg/m2  SpO2 96%  Active Problems: Active Problems:   CHF exacerbation   Acute systolic heart failure   Labs:   07/10/14 0150 07/10/14 0928 07/11/14 0453 07/12/14 0555  HGB 13.9 13.9  --   --   HCT 41.2 41.8  --   --   PLT 163 168  --   --   CREATININE 1.00  --  0.85 1.04   Estimated Creatinine Clearance: 74.6 mL/min (by C-G formula based on Cr of 1.04).  Medical / Surgical History: Past Medical History  Diagnosis Date  . A-fib   . Hypertension   . Asthma   . CHF (congestive heart failure)    History reviewed. No pertinent past surgical history.  Current Medication[s] Include: Medication PTA: Prescriptions prior to admission  Medication Sig Dispense Refill Last Dose  . ADVAIR DISKUS 250-50 MCG/DOSE AEPB Inhale 1 puff into the lungs 2 (two) times daily.    07/09/2014 at Unknown time  . apixaban (ELIQUIS) 5 MG TABS tablet Take 5 mg by mouth 2 (two) times daily.   07/09/2014 at Unknown time  . atorvastatin (LIPITOR) 20 MG tablet Take 20 mg by mouth daily at 6 PM.    07/09/2014 at Unknown time  . azithromycin (ZITHROMAX) 250 MG tablet Take 250 mg by mouth daily.    07/09/2014 at Unknown time  . metoprolol succinate (TOPROL-XL) 50 MG 24 hr tablet Take 100 mg by mouth daily.    07/09/2014 at 0800  . PROAIR HFA 108 (90 BASE) MCG/ACT inhaler Inhale 1 puff into the lungs every 4 (four) hours as needed for wheezing or shortness of breath.    07/09/2014 at Unknown time  . SPIRIVA HANDIHALER 18 MCG inhalation capsule Place 18 mcg into inhaler and inhale daily.    07/09/2014 at Unknown time  . spironolactone (ALDACTONE) 25 MG tablet Take 25 mg by mouth daily.    07/09/2014 at Unknown time     Scheduled:  Scheduled:  . ALPRAZolam  0.25 mg Oral BID  . atorvastatin  20 mg Oral q1800  . azithromycin  250 mg Oral Daily  . carvedilol  3.125 mg Oral BID WC  . mometasone-formoterol  2 puff Inhalation BID  . potassium chloride  20 mEq Oral Daily  . sodium chloride  3 mL Intravenous Q12H  . spironolactone  25 mg Oral Daily  . tiotropium  18 mcg Inhalation Daily   Infusion[s]: Infusions:     Antibiotic[s]: Anti-infectives    Start     Dose/Rate Route Frequency Ordered Stop   07/10/14 1000  azithromycin (ZITHROMAX) tablet 250 mg     250 mg Oral Daily 07/10/14 0827        Assessment:  74 y.o.male with PMH of mild CAD, HTN, Prediabetic, HLD, DJD,  COPD, chronic atrial fibrillation, treated with rate control and oral anticoagulation with Apixaban.  Last dose 3/12 @ 2100 pm.    Heparin infusion ordered per Pharmacy consult.  Goal of Therapy:  Heparin goal is Heparin level 0.3-0.7 units/ml.      Plan:  1. Begin Heparin, no bolus, at 1200 units/hr. Will check Heparin Level in  8 hours after starting Heparin. Daily Heparin Levels, Platelet counts, CBC.  Monitor for bleeding complications   Marthenia Rolling,  Pharm.D   07/12/2014,  10:42 AM. 07/12/2014,  10:42 AM     Addendum:  Initial heparin level high (1.5), confirmed with RN, drawn from opposite arm from infusion site.  However, checked a PTT as well and it is low (49).  Highly suspect that heparin level is falsely elevated due to lingering effect of PTA apixiban.  Plan: Increase IV heparin to 1400 units/hr. Recheck PTT and HL tomorrow AM.  Uvaldo Rising, BCPS  Clinical Pharmacist Pager [phone]  07/12/2014 9:22 PM

## 2014-07-13 ENCOUNTER — Inpatient Hospital Stay (HOSPITAL_COMMUNITY): Payer: Medicare Other

## 2014-07-13 ENCOUNTER — Observation Stay (HOSPITAL_COMMUNITY): Payer: Medicare Other | Admitting: Anesthesiology

## 2014-07-13 ENCOUNTER — Encounter (HOSPITAL_COMMUNITY)
Admission: EM | Disposition: A | Discharge status: Home / Self Care | Payer: Self-pay | Source: Home / Self Care | Attending: Cardiology

## 2014-07-13 ENCOUNTER — Other Ambulatory Visit: Payer: Self-pay | Admitting: *Deleted

## 2014-07-13 ENCOUNTER — Encounter (HOSPITAL_COMMUNITY): Payer: Self-pay | Admitting: *Deleted

## 2014-07-13 DIAGNOSIS — Z87891 Personal history of nicotine dependence: Secondary | ICD-10-CM | POA: Diagnosis not present

## 2014-07-13 DIAGNOSIS — Z9049 Acquired absence of other specified parts of digestive tract: Secondary | ICD-10-CM | POA: Diagnosis present

## 2014-07-13 DIAGNOSIS — I5023 Acute on chronic systolic (congestive) heart failure: Secondary | ICD-10-CM | POA: Diagnosis present

## 2014-07-13 DIAGNOSIS — Z96653 Presence of artificial knee joint, bilateral: Secondary | ICD-10-CM | POA: Diagnosis present

## 2014-07-13 DIAGNOSIS — M1990 Unspecified osteoarthritis, unspecified site: Secondary | ICD-10-CM | POA: Diagnosis present

## 2014-07-13 DIAGNOSIS — I77819 Aortic ectasia, unspecified site: Secondary | ICD-10-CM | POA: Diagnosis not present

## 2014-07-13 DIAGNOSIS — E785 Hyperlipidemia, unspecified: Secondary | ICD-10-CM | POA: Diagnosis present

## 2014-07-13 DIAGNOSIS — J449 Chronic obstructive pulmonary disease, unspecified: Secondary | ICD-10-CM | POA: Diagnosis present

## 2014-07-13 DIAGNOSIS — I2510 Atherosclerotic heart disease of native coronary artery without angina pectoris: Secondary | ICD-10-CM | POA: Diagnosis present

## 2014-07-13 DIAGNOSIS — E780 Pure hypercholesterolemia: Secondary | ICD-10-CM | POA: Diagnosis present

## 2014-07-13 DIAGNOSIS — I10 Essential (primary) hypertension: Secondary | ICD-10-CM | POA: Diagnosis present

## 2014-07-13 DIAGNOSIS — I509 Heart failure, unspecified: Secondary | ICD-10-CM | POA: Diagnosis not present

## 2014-07-13 DIAGNOSIS — I77810 Thoracic aortic ectasia: Secondary | ICD-10-CM

## 2014-07-13 DIAGNOSIS — I420 Dilated cardiomyopathy: Secondary | ICD-10-CM | POA: Diagnosis present

## 2014-07-13 DIAGNOSIS — R7309 Other abnormal glucose: Secondary | ICD-10-CM | POA: Diagnosis present

## 2014-07-13 DIAGNOSIS — Z7901 Long term (current) use of anticoagulants: Secondary | ICD-10-CM | POA: Diagnosis not present

## 2014-07-13 DIAGNOSIS — R0602 Shortness of breath: Secondary | ICD-10-CM | POA: Diagnosis not present

## 2014-07-13 DIAGNOSIS — I351 Nonrheumatic aortic (valve) insufficiency: Secondary | ICD-10-CM | POA: Diagnosis present

## 2014-07-13 DIAGNOSIS — R911 Solitary pulmonary nodule: Secondary | ICD-10-CM | POA: Diagnosis not present

## 2014-07-13 DIAGNOSIS — I340 Nonrheumatic mitral (valve) insufficiency: Secondary | ICD-10-CM | POA: Diagnosis not present

## 2014-07-13 DIAGNOSIS — I4891 Unspecified atrial fibrillation: Secondary | ICD-10-CM | POA: Diagnosis not present

## 2014-07-13 DIAGNOSIS — J45909 Unspecified asthma, uncomplicated: Secondary | ICD-10-CM | POA: Diagnosis present

## 2014-07-13 DIAGNOSIS — I482 Chronic atrial fibrillation: Secondary | ICD-10-CM | POA: Diagnosis present

## 2014-07-13 DIAGNOSIS — I5020 Unspecified systolic (congestive) heart failure: Secondary | ICD-10-CM | POA: Diagnosis not present

## 2014-07-13 HISTORY — PX: CARDIOVERSION: SHX1299

## 2014-07-13 HISTORY — PX: TEE WITHOUT CARDIOVERSION: SHX5443

## 2014-07-13 LAB — BASIC METABOLIC PANEL
ANION GAP: 8 (ref 5–15)
BUN: 15 mg/dL (ref 6–23)
CO2: 24 mmol/L (ref 19–32)
CREATININE: 0.88 mg/dL (ref 0.50–1.35)
Calcium: 8.8 mg/dL (ref 8.4–10.5)
Chloride: 106 mmol/L (ref 96–112)
GFR, EST NON AFRICAN AMERICAN: 84 mL/min — AB (ref >90)
Glucose, Bld: 113 mg/dL — ABNORMAL HIGH (ref 70–99)
Potassium: 4 mmol/L (ref 3.5–5.1)
Sodium: 138 mmol/L (ref 135–145)

## 2014-07-13 LAB — PULMONARY FUNCTION TEST
DL/VA % pred: 94 %
DL/VA: 4.54 ml/min/mmHg/L
DLCO cor % pred: 67 %
DLCO cor: 25.39 ml/min/mmHg
DLCO unc % pred: 67 %
DLCO unc: 25.46 ml/min/mmHg
FEF 25-75 Post: 1.48 L/sec
FEF 25-75 Pre: 0.98 L/sec
FEF2575-%Change-Post: 52 %
FEF2575-%Pred-Post: 53 %
FEF2575-%Pred-Pre: 35 %
FEV1-%Change-Post: 15 %
FEV1-%Pred-Post: 66 %
FEV1-%Pred-Pre: 57 %
FEV1-Post: 2.48 L
FEV1-Pre: 2.15 L
FEV1FVC-%Change-Post: 8 %
FEV1FVC-%Pred-Pre: 77 %
FEV6-%Change-Post: 8 %
FEV6-%Pred-Post: 83 %
FEV6-%Pred-Pre: 77 %
FEV6-Post: 3.99 L
FEV6-Pre: 3.69 L
FEV6FVC-%Change-Post: 2 %
FEV6FVC-%Pred-Post: 104 %
FEV6FVC-%Pred-Pre: 102 %
FVC-%Change-Post: 6 %
FVC-%Pred-Post: 80 %
FVC-%Pred-Pre: 75 %
FVC-Post: 4.04 L
FVC-Pre: 3.79 L
Post FEV1/FVC ratio: 61 %
Post FEV6/FVC ratio: 99 %
Pre FEV1/FVC ratio: 57 %
Pre FEV6/FVC Ratio: 97 %
RV % pred: 139 %
RV: 3.79 L
TLC % pred: 101 %
TLC: 7.96 L

## 2014-07-13 LAB — CBC
HCT: 43.4 % (ref 39.0–52.0)
HEMOGLOBIN: 14.7 g/dL (ref 13.0–17.0)
MCH: 30.4 pg (ref 26.0–34.0)
MCHC: 33.9 g/dL (ref 30.0–36.0)
MCV: 89.9 fL (ref 78.0–100.0)
PLATELETS: 171 10*3/uL (ref 150–400)
RBC: 4.83 MIL/uL (ref 4.22–5.81)
RDW: 13.1 % (ref 11.5–15.5)
WBC: 5.1 10*3/uL (ref 4.0–10.5)

## 2014-07-13 LAB — APTT
aPTT: 86 seconds — ABNORMAL HIGH (ref 24–37)
aPTT: 78 seconds — ABNORMAL HIGH (ref 24–37)

## 2014-07-13 LAB — HEPARIN LEVEL (UNFRACTIONATED)
HEPARIN UNFRACTIONATED: 1.05 [IU]/mL — AB (ref 0.30–0.70)
Heparin Unfractionated: 0.91 IU/mL — ABNORMAL HIGH (ref 0.30–0.70)

## 2014-07-13 SURGERY — ECHOCARDIOGRAM, TRANSESOPHAGEAL
Anesthesia: Moderate Sedation

## 2014-07-13 SURGERY — ECHOCARDIOGRAM, TRANSESOPHAGEAL
Anesthesia: Monitor Anesthesia Care

## 2014-07-13 MED ORDER — SODIUM CHLORIDE 0.9 % IV SOLN
INTRAVENOUS | Status: DC
  Administered 2014-07-13 – 2014-07-14 (×2): via INTRAVENOUS

## 2014-07-13 MED ORDER — MIDAZOLAM HCL 5 MG/5ML IJ SOLN
INTRAMUSCULAR | Status: DC | PRN
  Administered 2014-07-13: 1 mg via INTRAVENOUS

## 2014-07-13 MED ORDER — METOPROLOL TARTRATE 1 MG/ML IV SOLN
INTRAVENOUS | Status: AC
  Filled 2014-07-13: qty 5

## 2014-07-13 MED ORDER — FENTANYL CITRATE 0.05 MG/ML IJ SOLN
INTRAMUSCULAR | Status: DC | PRN
  Administered 2014-07-13: 50 ug via INTRAVENOUS

## 2014-07-13 MED ORDER — PROPOFOL INFUSION 10 MG/ML OPTIME
INTRAVENOUS | Status: DC | PRN
  Administered 2014-07-13: 50 ug/kg/min via INTRAVENOUS

## 2014-07-13 MED ORDER — ASPIRIN 81 MG PO CHEW
81.0000 mg | CHEWABLE_TABLET | ORAL | Status: AC
  Administered 2014-07-14: 81 mg via ORAL
  Filled 2014-07-13: qty 1

## 2014-07-13 MED ORDER — SODIUM CHLORIDE 0.9 % IJ SOLN: 3.0000 mL | INTRAMUSCULAR | Status: DC | PRN

## 2014-07-13 MED ORDER — AMIODARONE HCL 200 MG PO TABS
200.0000 mg | ORAL_TABLET | Freq: Two times a day (BID) | ORAL | Status: DC
  Administered 2014-07-13 – 2014-07-15 (×4): 200 mg via ORAL
  Filled 2014-07-13 (×5): qty 1

## 2014-07-13 MED ORDER — METOPROLOL TARTRATE 1 MG/ML IV SOLN: 5.0000 mg | Freq: Once | INTRAVENOUS | Status: DC

## 2014-07-13 MED ORDER — BUTAMBEN-TETRACAINE-BENZOCAINE 2-2-14 % EX AERO
INHALATION_SPRAY | CUTANEOUS | Status: DC | PRN
  Administered 2014-07-13: 2 via TOPICAL

## 2014-07-13 MED ORDER — SODIUM CHLORIDE 0.9 % IJ SOLN
3.0000 mL | Freq: Two times a day (BID) | INTRAMUSCULAR | Status: DC
  Administered 2014-07-14: 3 mL via INTRAVENOUS

## 2014-07-13 MED ORDER — SODIUM CHLORIDE 0.9 % IV SOLN: 250.0000 mL | INTRAVENOUS | Status: DC | PRN

## 2014-07-13 MED ORDER — METOPROLOL TARTRATE 1 MG/ML IV SOLN
5.0000 mg | Freq: Once | INTRAVENOUS | Status: AC
  Administered 2014-07-13: 5 mg via INTRAVENOUS

## 2014-07-13 MED ORDER — PROPOFOL 10 MG/ML IV BOLUS
INTRAVENOUS | Status: DC | PRN
  Administered 2014-07-13: 20 mg via INTRAVENOUS

## 2014-07-13 NOTE — Interval H&P Note (Signed)
History and Physical Interval Note:  07/13/2014 8:52 AM  [name]  has presented today for surgery, with the diagnosis of a fib  The various methods of treatment have been discussed with the patient and family. After consideration of risks, benefits and other options for treatment, the patient has consented to  Procedure(s): TRANSESOPHAGEAL ECHOCARDIOGRAM (TEE) WITH CARDIOVERSION (N/A) CARDIOVERSION (N/A) as a surgical intervention .  The patient's history has been reviewed, patient examined, no change in status, stable for surgery.  I have reviewed the patient's chart and labs.  Questions were answered to the patient's satisfaction.     [name] S

## 2014-07-13 NOTE — CV Procedure (Signed)
PRE-OP DIAGNOSIS:  Atrial fibrillation with moderate ventricular response.  POST-OP DIAGNOSIS:  Successful cardioversion to sinus rhythm.  OPERATOR:  Dixie Dials, MD.     ANESTHESIA:  Deep sedation/Propafol.  COMPLICATIONS:  None.   OPERATIVE TERM:  DC cardioversion.  The nature of the procedure, risks and alternatives were discussed with the patient who gave informed consent.  OPERATIVE TECHNIQUE:  The patient was sedated with Propafol.  When the patient was no longer responsive to quiet voice, DC cardioversion was performed with 120 J biphasically and synchronously.  Successful cardioversion with sinus rhythm was achieved.  The patient was then monitored until fully alert and left the procedure area in stable condition.  IMPRESSION:  Successful DC cardioversion.

## 2014-07-13 NOTE — H&P (View-Only) (Signed)
Ref: Mark Demark, MD   Subjective:  Low blood pressure. Ambulating without chest pain. Afebrile.  Objective:  Vital Signs in the last 24 hours: Temp:  [97.3 F (36.3 C)-97.9 F (36.6 C)] 97.5 F (36.4 C) (03/13 0900) Pulse Rate:  [81-100] 90 (03/13 0900) Cardiac Rhythm:  [-] Atrial fibrillation (03/12 2011) Resp:  [18-30] 20 (03/13 0900) BP: (81-110)/(39-74) 97/39 mmHg (03/13 0900) SpO2:  [94 %-100 %] 96 % (03/13 0938) Weight:  [86.5 kg (190 lb 11.2 oz)] 86.5 kg (190 lb 11.2 oz) (03/13 0536)  Physical Exam: BP Readings from Last 1 Encounters:  07/12/14 97/39    Wt Readings from Last 1 Encounters:  07/12/14 86.5 kg (190 lb 11.2 oz)    Weight change: -0.228 kg (-8 oz)  HEENT: [place]/AT, Eyes- PERL, EOMI, Conjunctiva-Pink, Sclera-Non-icteric Neck: No JVD, No bruit, Trachea midline. Lungs:  Clear, Bilateral. Cardiac: Irregular rhythm, normal S1 and S2, no S3. II/VI systolic and diastolic murmur. Abdomen:  Soft, non-tender. Extremities:  No edema present. No cyanosis. No clubbing. CNS: AxOx3, Cranial nerves grossly intact, moves all 4 extremities. Right handed. Skin: Warm and dry.   Intake/Output from previous day: 03/12 0701 - 03/13 0700 In: 1563 [P.O.:1560; I.V.:3] Out: 2100 [Urine:2100]    Lab Results: BMET    Component Value Date/Time   NA 136 07/12/2014 0555   NA 138 07/11/2014 0453   NA 136 07/10/2014 0150   K 3.7 07/12/2014 0555   K 3.8 07/11/2014 0453   K 4.4 07/10/2014 0150   CL 100 07/12/2014 0555   CL 104 07/11/2014 0453   CL 103 07/10/2014 0150   CO2 26 07/12/2014 0555   CO2 25 07/11/2014 0453   CO2 24 07/10/2014 0150   GLUCOSE 133* 07/12/2014 0555   GLUCOSE 112* 07/11/2014 0453   GLUCOSE 134* 07/10/2014 0150   BUN 17 07/12/2014 0555   BUN 12 07/11/2014 0453   BUN 14 07/10/2014 0150   CREATININE 1.04 07/12/2014 0555   CREATININE 0.85 07/11/2014 0453   CREATININE 1.00 07/10/2014 0150   CALCIUM 9.4 07/12/2014 0555   CALCIUM 9.1 07/11/2014 0453    CALCIUM 9.0 07/10/2014 0150   GFRNONAA 70* 07/12/2014 0555   GFRNONAA 85* 07/11/2014 0453   GFRNONAA 73* 07/10/2014 0150   GFRAA 81* 07/12/2014 0555   GFRAA >90 07/11/2014 0453   GFRAA 85* 07/10/2014 0150   CBC    Component Value Date/Time   WBC 4.6 07/10/2014 0928   RBC 4.64 07/10/2014 0928   HGB 13.9 07/10/2014 0928   HCT 41.8 07/10/2014 0928   PLT 168 07/10/2014 0928   MCV 90.1 07/10/2014 0928   MCH 30.0 07/10/2014 0928   MCHC 33.3 07/10/2014 0928   RDW 13.2 07/10/2014 0928   LYMPHSABS 1.4 07/10/2014 0928   MONOABS 0.5 07/10/2014 0928   EOSABS 0.1 07/10/2014 0928   BASOSABS 0.0 07/10/2014 0928   HEPATIC Function Panel  Recent Labs  07/10/14 0150  PROT 6.5   HEMOGLOBIN A1C No components found for: HGA1C,  MPG CARDIAC ENZYMES Lab Results  Component Value Date   TROPONINI <0.03 07/10/2014   TROPONINI <0.03 07/10/2014   TROPONINI <0.03 07/10/2014   BNP No results for input(s): PROBNP in the last 8760 hours. TSH No results for input(s): TSH in the last 8760 hours. CHOLESTEROL No results for input(s): CHOL in the last 8760 hours.  Scheduled Meds: . ALPRAZolam  0.25 mg Oral BID  . atorvastatin  20 mg Oral q1800  . azithromycin  250 mg Oral Daily  .  carvedilol  3.125 mg Oral BID WC  . mometasone-formoterol  2 puff Inhalation BID  . potassium chloride  20 mEq Oral BID  . sodium chloride  3 mL Intravenous Q12H  . spironolactone  25 mg Oral Daily  . tiotropium  18 mcg Inhalation Daily   Continuous Infusions: . heparin     PRN Meds:.sodium chloride, acetaminophen, albuterol, ondansetron (ZOFRAN) IV, sodium chloride  Assessment/Plan: Dilated aortic root with Aortic insufficiency Right lung nodule. Acute systolic congestive heart failure probably secondary to tachycardia A. fib with moderate ventricular response Hypertension Prediabetic Mild CAD COPD Hypercholesteremia Degenerative joint disease   TEE with cardioversion in AM. Cardiac cath on  Tuesday AM.     LOS: 2 days    Dixie Dials  MD  07/12/2014, 12:59 PM

## 2014-07-13 NOTE — Progress Notes (Signed)
  Echocardiogram 2D Echocardiogram has been performed.  Donata Clay 07/13/2014, 10:07 AM

## 2014-07-13 NOTE — Progress Notes (Signed)
Patient noted to have questionable sinus arrhythmia vs. atrial flutter on two EKGs performed during day shift.  Dr. Terrence Dupont made aware, gave verbal order for amiodarone 200mg  PO BID.  Gave order for EKG in AM.  Orders placed, patient updated.  Will continue to monitor.

## 2014-07-13 NOTE — Transfer of Care (Signed)
Immediate Anesthesia Transfer of Care Note  Patient: [name]  Procedure(s) Performed: Procedure(s): TRANSESOPHAGEAL ECHOCARDIOGRAM (TEE) WITH CARDIOVERSION (N/A) CARDIOVERSION (N/A)  Patient Location: Endoscopy Unit  Anesthesia Type:MAC  Level of Consciousness: awake, alert  and oriented  Airway & Oxygen Therapy: Patient Spontanous Breathing  Post-op Assessment: Report given to RN  Post vital signs: Reviewed and stable  Last Vitals:  Filed Vitals:   07/13/14 0820  BP:   Pulse:   Temp: 36.6 C  Resp:     Complications: No apparent anesthesia complications

## 2014-07-13 NOTE — Consult Note (Signed)
PHARMACY CONSULT NOTE  Pharmacy Consult :  Heparin  Indication : atrial fibrillation   Allergies: No Known Allergies  Heparin Dosing weight : 87 kg  Vital Signs: BP 110/66 mmHg  Pulse 74  Temp(Src) 97.6 F (36.4 C) (Oral)  Resp 16  Ht 6\' 2"  (1.88 m)  Wt 190 lb 1.6 oz (86.229 kg)  BMI 24.40 kg/m2  SpO2 98%  Active Problems: Active Problems:   CHF exacerbation   Acute systolic heart failure   Labs:   07/10/14 0150 07/10/14 0928 07/11/14 0453 07/12/14 0555  HGB 13.9 13.9  --   --   HCT 41.2 41.8  --   --   PLT 163 168  --   --   CREATININE 1.00  --  0.85 1.04   Estimated Creatinine Clearance: 74.6 mL/min (by C-G formula based on Cr of 1.04).    Assessment: 74 y.o.male with PMH of mild CAD, HTN, Prediabetic, HLD, DJD,  COPD, chronic atrial fibrillation, treated with rate control and oral anticoagulation with Apixaban.  Last dose 3/12 @ 2100 pm (?or 07/09/14).  PTT 78 sec and heparin level 0.91 Heparin level is falsely elevated due to lingering effect of PTA apixiban. PTT of 78 seconds is therapeutic on heparin rate 1400 units/hr.  CBC stable. No bleeding noted. S/p successful cardioversion this am. TCTS to see for possible aortic root and valve surgery and treatment of R lung nodule.     Goal of Therapy: PTT 66-102 seconds Heparin level 0.3-0.7 units/ml.      Plan:  Continue  IV heparin at 1400 units/hr. daily PTT/Heparin level and CBC.  Will stop checking PTTs when heparin level begins to correlate with PTT as apixaban effect ends.  Eudelia Bunch, Pharm.D. [phone] 07/13/2014 1:21 PM

## 2014-07-13 NOTE — Progress Notes (Signed)
Subjective:  Patient denies any chest pain states breathing is improved.  Patient underwent TEE/cardioversion earlier this morning tolerated procedure well.results of CT noted which showed aneurysmal dilatation of the aortic root.  Also noted to have right upper lobe pulmonary nodule suspicious for lung malignancy.  Objective:  Vital Signs in the last 24 hours: Temp:  [97.4 F (36.3 C)-98 F (36.7 C)] 97.9 F (36.6 C) (03/14 1640) Pulse Rate:  [56-98] 76 (03/14 1702) Resp:  [12-22] 18 (03/14 1640) BP: (100-133)/(57-83) 117/63 mmHg (03/14 1702) SpO2:  [94 %-100 %] 98 % (03/14 1640) Weight:  [86.229 kg (190 lb 1.6 oz)] 86.229 kg (190 lb 1.6 oz) (03/14 0530)  Intake/Output from previous day: 03/13 0701 - 03/14 0700 In: 993 [P.O.:780; I.V.:213] Out: 1250 [Urine:1250] Intake/Output from this shift: Total I/O In: 1420 [P.O.:1170; I.V.:250] Out: 500 [Urine:500]  Physical Exam: Neck: no adenopathy, no carotid bruit, no JVD and supple, symmetrical, trachea midline Lungs: clear to auscultation bilaterally Heart: regular rate and rhythm, S1, S2 normal and soft systolic and diastolic murmur noted Abdomen: soft, non-tender; bowel sounds normal; no masses,  no organomegaly Extremities: extremities normal, atraumatic, no cyanosis or edema  Lab Results:  Recent Labs  07/13/14 0356  WBC 5.1  HGB 14.7  PLT 171    Recent Labs  07/12/14 0555 07/13/14 0356  NA 136 138  K 3.7 4.0  CL 100 106  CO2 26 24  GLUCOSE 133* 113*  BUN 17 15  CREATININE 1.04 0.88    Recent Labs  07/10/14 2000  TROPONINI <0.03   Hepatic Function Panel No results for input(s): PROT, ALBUMIN, AST, ALT, ALKPHOS, BILITOT, BILIDIR, IBILI in the last 72 hours. No results for input(s): CHOL in the last 72 hours. No results for input(s): PROTIME in the last 72 hours.  Imaging: Imaging results have been reviewed and No results found.  Cardiac Studies:  Assessment/Plan:  Compensated systolic heart  failure Dilated cardiomyopathy Status post A. Fib with RVR status post cardioversion Ascending thoracic aortic aneurysm Right lung nodule Hypertension P diabetic COPD Hypercholesterolemia Degenerative joint disease Plan Discussed with patient regarding left cardiac catheterization prior to possible aortic root /surgical history his risk and benefits i.e. Death, myocardial infarction,stroke need for emergency CABG local vascular complications etc. And consents the procedure.  LOS: 3 days    Cadynce Garrette N 07/13/2014, 5:34 PM

## 2014-07-13 NOTE — Anesthesia Procedure Notes (Signed)
Procedure Name: MAC Date/Time: 07/13/2014 9:30 AM Performed by: Barrington Ellison Pre-anesthesia Checklist: Patient identified, Emergency Drugs available, Suction available, Patient being monitored and Timeout performed Patient Re-evaluated:Patient Re-evaluated prior to inductionOxygen Delivery Method: Nasal cannula

## 2014-07-13 NOTE — Anesthesia Postprocedure Evaluation (Signed)
  Anesthesia Post-op Note  Patient: [name]  Procedure(s) Performed: Procedure(s): TRANSESOPHAGEAL ECHOCARDIOGRAM (TEE) WITH CARDIOVERSION (N/A) CARDIOVERSION (N/A)  Patient Location: PACU  Anesthesia Type:MAC  Level of Consciousness: awake, alert  and oriented  Airway and Oxygen Therapy: Patient Spontanous Breathing and Patient connected to nasal cannula oxygen  Post-op Pain: none  Post-op Assessment: Post-op Vital signs reviewed  Post-op Vital Signs: Reviewed and stable  Last Vitals:  Filed Vitals:   07/13/14 0820  BP:   Pulse:   Temp: 36.6 C  Resp:     Complications: No apparent anesthesia complications

## 2014-07-13 NOTE — Progress Notes (Signed)
UR completed 

## 2014-07-13 NOTE — CV Procedure (Signed)
INDICATIONS:   The patient is 74 year old for additional evaluation of aortic valve and dilated aortic root and ascending aorta and external cardioversion for atrial fibrillation.  PROCEDURE:  Informed consent was discussed including risks, benefits and alternatives for the procedure.  Risks include, but are not limited to, cough, sore throat, vomiting, nausea, somnolence, esophageal and stomach trauma or perforation, bleeding, low blood pressure, aspiration, pneumonia, infection, trauma to the teeth and death.    Patient was given sedation.  The oropharynx was anesthetized with topical lidocaine.  The transesophageal probe was inserted in the esophagus and stomach and multiple views were obtained.  Agitated saline was used after the transesophageal probe was removed from the body.  The patient was kept under observation until the patient left the procedure room.  The patient left the procedure room in stable condition.   COMPLICATIONS:  There were no immediate complications.  FINDINGS:  1. LEFT VENTRICLE: The left ventricle is mildly dilated in structure and has moderate generalized hypokinesia with EF 30-35 %.  No thrombus or masses seen in the left ventricle.  2. RIGHT VENTRICLE:  The right ventricle is normal in structure and mildly hypokinetic in function without any thrombus or masses.    3. LEFT ATRIUM:  The left atrium is moderte to severly dilated with spontaneous echocontrast and without any thrombus or masses.  4. LEFT ATRIAL APPENDAGE:  The left atrial appendage is free of any thrombus or masses.  5. RIGHT ATRIUM:  The right atrium is free of any thrombus or masses. Right atrial appendage was small.   6. ATRIAL SEPTUM:  The atrial septum is normal without any ASD or PFO.  7. MITRAL VALVE:  The mitral valve is normal in structure and function with moderate regurgitation, no masses, stenosis or vegetations.  8. TRICUSPID VALVE:  The tricuspid valve is normal in structure and  function with mild regurgitation, no masses, stenosis or vegetations.  9. AORTIC VALVE:  The aortic valve is normal in structure and function with moderate regurgitation, no masses, stenosis or vegetations.   10. PULMONIC VALVE:  The pulmonic valve is normal in structure and function with trivial to mild regurgitation, no masses, stenosis or vegetations.  11. AORTIC ARCH, ASCENDING AND DESCENDING AORTA:  The aorta had severe atherosclerosis in the descending tortuous aorta without dilatation.  The aortic arch was mildly atherosclerotic. 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  IMPRESSION:   1 Moderate Aortic and mitral regurgitation. 2. Mild Tricuspid and pulmonary regurgitation. 3. Moderate LV systolic dysfunction. 4. Dilated LA  And LA appendage are without thrombus. 5.No PFO.   RECOMMENDATIONS:    Awaiting possible Aortic root and valve surgery by CVTS post additional investigations. Underwent successful cardioversion to sinus rhythm.

## 2014-07-13 NOTE — Consult Note (Signed)
PHARMACY CONSULT NOTE  Pharmacy Consult :  Heparin  Indication : atrial fibrillation   Allergies: No Known Allergies  Heparin Dosing weight : 87 kg  Vital Signs: BP 110/67 mmHg  Pulse 98  Temp(Src) 97.4 F (36.3 C) (Oral)  Resp 18  Ht 6\' 2"  (1.88 m)  Wt 190 lb 11.2 oz (86.5 kg)  BMI 24.47 kg/m2  SpO2 95%  Active Problems: Active Problems:   CHF exacerbation   Acute systolic heart failure   Labs:   07/10/14 0150 07/10/14 0928 07/11/14 0453 07/12/14 0555  HGB 13.9 13.9  --   --   HCT 41.2 41.8  --   --   PLT 163 168  --   --   CREATININE 1.00  --  0.85 1.04   Estimated Creatinine Clearance: 74.6 mL/min (by C-G formula based on Cr of 1.04).  Medical / Surgical History: Past Medical History  Diagnosis Date  . A-fib   . Hypertension   . Asthma   . CHF (congestive heart failure)    History reviewed. No pertinent past surgical history.  Current Medication[s] Include: Medication PTA: Prescriptions prior to admission  Medication Sig Dispense Refill Last Dose  . ADVAIR DISKUS 250-50 MCG/DOSE AEPB Inhale 1 puff into the lungs 2 (two) times daily.    07/09/2014 at Unknown time  . apixaban (ELIQUIS) 5 MG TABS tablet Take 5 mg by mouth 2 (two) times daily.   07/09/2014 at Unknown time  . atorvastatin (LIPITOR) 20 MG tablet Take 20 mg by mouth daily at 6 PM.    07/09/2014 at Unknown time  . azithromycin (ZITHROMAX) 250 MG tablet Take 250 mg by mouth daily.    07/09/2014 at Unknown time  . metoprolol succinate (TOPROL-XL) 50 MG 24 hr tablet Take 100 mg by mouth daily.    07/09/2014 at 0800  . PROAIR HFA 108 (90 BASE) MCG/ACT inhaler Inhale 1 puff into the lungs every 4 (four) hours as needed for wheezing or shortness of breath.    07/09/2014 at Unknown time  . SPIRIVA HANDIHALER 18 MCG inhalation capsule Place 18 mcg into inhaler and inhale daily.    07/09/2014 at Unknown time  . spironolactone (ALDACTONE) 25 MG tablet Take 25 mg by mouth daily.    07/09/2014 at Unknown time     Scheduled:  Scheduled:  . ALPRAZolam  0.25 mg Oral BID  . atorvastatin  20 mg Oral q1800  . azithromycin  250 mg Oral Daily  . carvedilol  3.125 mg Oral BID WC  . mometasone-formoterol  2 puff Inhalation BID  . potassium chloride  20 mEq Oral BID  . sodium chloride  3 mL Intravenous Q12H  . spironolactone  25 mg Oral Daily  . tiotropium  18 mcg Inhalation Daily   Infusion[s]: Infusions:  . sodium chloride 30 mL/hr at 07/12/14 2336  . heparin 1,400 Units/hr (07/12/14 2129)     Antibiotic[s]: Anti-infectives    Start     Dose/Rate Route Frequency Ordered Stop   07/10/14 1000  azithromycin (ZITHROMAX) tablet 250 mg     250 mg Oral Daily 07/10/14 0827        Assessment: 74 y.o.male with PMH of mild CAD, HTN, Prediabetic, HLD, DJD,  COPD, chronic atrial fibrillation, treated with rate control and oral anticoagulation with Apixaban.  Last dose 3/12 @ 2100 pm (?or 07/09/14).  PTT 86 sec and heparin level 1.05 ,heparin level is falsely elevated due to lingering effect of PTA apixiban. PTT of 86 seconds  is therapeutic on heparin rate 1400 units/hr.  CBC stable. No bleeding noted.    Goal of Therapy: PTT 66-102 seconds  Heparin goal is Heparin level 0.3-0.7 units/ml.      Plan:  Continue  IV heparin to 1400 units/hr. Recheck PTT and HL in 6 hours to confirm therapeutic then daily PTT/Heparin level and CBC.  Will stop checking PTTs when heparin level begins to correlate with PTT as apixaban effect ends.  Nicole Cella, RPh Clinical Pharmacist Pager: [phone]  07/13/2014 5:21 AM

## 2014-07-14 ENCOUNTER — Other Ambulatory Visit: Payer: Self-pay | Admitting: *Deleted

## 2014-07-14 ENCOUNTER — Encounter (HOSPITAL_COMMUNITY): Payer: Self-pay | Admitting: Cardiovascular Disease

## 2014-07-14 ENCOUNTER — Encounter (HOSPITAL_COMMUNITY): Payer: Self-pay | Admitting: Anesthesiology

## 2014-07-14 ENCOUNTER — Encounter (HOSPITAL_COMMUNITY)
Admission: EM | Disposition: A | Discharge status: Home / Self Care | Payer: Self-pay | Source: Home / Self Care | Attending: Cardiology

## 2014-07-14 DIAGNOSIS — R911 Solitary pulmonary nodule: Secondary | ICD-10-CM

## 2014-07-14 HISTORY — PX: LEFT AND RIGHT HEART CATHETERIZATION WITH CORONARY ANGIOGRAM: SHX5449

## 2014-07-14 LAB — APTT
APTT: 110 s — AB (ref 24–37)
APTT: 115 s — AB (ref 24–37)

## 2014-07-14 LAB — POCT ACTIVATED CLOTTING TIME: Activated Clotting Time: 116 seconds

## 2014-07-14 LAB — CBC
HEMATOCRIT: 42.6 % (ref 39.0–52.0)
Hemoglobin: 14 g/dL (ref 13.0–17.0)
MCH: 29.9 pg (ref 26.0–34.0)
MCHC: 32.9 g/dL (ref 30.0–36.0)
MCV: 91 fL (ref 78.0–100.0)
Platelets: 169 10*3/uL (ref 150–400)
RBC: 4.68 MIL/uL (ref 4.22–5.81)
RDW: 13.2 % (ref 11.5–15.5)
WBC: 4.9 10*3/uL (ref 4.0–10.5)

## 2014-07-14 LAB — PROTIME-INR
INR: 1.47 (ref 0.00–1.49)
Prothrombin Time: 18 seconds — ABNORMAL HIGH (ref 11.6–15.2)

## 2014-07-14 LAB — HEPARIN LEVEL (UNFRACTIONATED)
Heparin Unfractionated: 0.78 IU/mL — ABNORMAL HIGH (ref 0.30–0.70)
Heparin Unfractionated: 0.72 IU/mL — ABNORMAL HIGH (ref 0.30–0.70)

## 2014-07-14 SURGERY — LEFT AND RIGHT HEART CATHETERIZATION WITH CORONARY ANGIOGRAM
Anesthesia: LOCAL

## 2014-07-14 MED ORDER — ONDANSETRON HCL 4 MG/2ML IJ SOLN: 4.0000 mg | Freq: Four times a day (QID) | INTRAMUSCULAR | Status: DC | PRN

## 2014-07-14 MED ORDER — LISINOPRIL 5 MG PO TABS
5.0000 mg | ORAL_TABLET | Freq: Every day | ORAL | Status: DC
  Administered 2014-07-14 – 2014-07-15 (×2): 5 mg via ORAL
  Filled 2014-07-14 (×2): qty 1

## 2014-07-14 MED ORDER — HEPARIN (PORCINE) IN NACL 2-0.9 UNIT/ML-% IJ SOLN
INTRAMUSCULAR | Status: AC
  Filled 2014-07-14: qty 1000

## 2014-07-14 MED ORDER — APIXABAN 5 MG PO TABS
5.0000 mg | ORAL_TABLET | Freq: Two times a day (BID) | ORAL | Status: DC
  Filled 2014-07-14: qty 1

## 2014-07-14 MED ORDER — FENTANYL CITRATE 0.05 MG/ML IJ SOLN
INTRAMUSCULAR | Status: AC
  Filled 2014-07-14: qty 2

## 2014-07-14 MED ORDER — MIDAZOLAM HCL 2 MG/2ML IJ SOLN
INTRAMUSCULAR | Status: AC
  Filled 2014-07-14: qty 2

## 2014-07-14 MED ORDER — ACETAMINOPHEN 325 MG PO TABS: 650.0000 mg | ORAL_TABLET | ORAL | Status: DC | PRN

## 2014-07-14 MED ORDER — LIDOCAINE HCL (PF) 1 % IJ SOLN
INTRAMUSCULAR | Status: AC
  Filled 2014-07-14: qty 30

## 2014-07-14 MED ORDER — APIXABAN 5 MG PO TABS
5.0000 mg | ORAL_TABLET | Freq: Two times a day (BID) | ORAL | Status: DC
  Administered 2014-07-14 – 2014-07-15 (×2): 5 mg via ORAL
  Filled 2014-07-14 (×3): qty 1

## 2014-07-14 MED ORDER — SODIUM CHLORIDE 0.9 % IV SOLN: INTRAVENOUS | Status: DC

## 2014-07-14 MED ORDER — NITROGLYCERIN 1 MG/10 ML FOR IR/CATH LAB
INTRA_ARTERIAL | Status: AC
  Filled 2014-07-14: qty 10

## 2014-07-14 NOTE — Progress Notes (Signed)
ANTICOAGULATION CONSULT NOTE - Follow Up Consult  Pharmacy Consult for heparin Indication: atrial fibrillation  Labs:  Recent Labs  07/12/14 0555  07/13/14 0356 07/13/14 1054 07/13/14 1205 07/14/14 0330 07/14/14 0336  HGB  --   --  14.7  --   --   --  14.0  HCT  --   --  43.4  --   --   --  42.6  PLT  --   --  171  --   --   --  169  APTT  --   < > 86*  --  78*  --  110*  LABPROT  --   --   --   --   --   --  18.0*  INR  --   --   --   --   --   --  1.47  HEPARINUNFRC  --   < > 1.05* 0.91*  --  0.78*  --   CREATININE 1.04  --  0.88  --   --   --   --   < > = values in this interval not displayed.    Assessment: 74yo male now slightly supratherapeutic on heparin after two PTTs at goal; heparin level seems to be closer to accurate.  Goal of Therapy:  Heparin level 0.3-0.7 units/ml aPTT 66-102 seconds   Plan:  Will decrease heparin gtt slightly to 1300 units/hr and check level/PTT in [place], PharmD, BCPS  07/14/2014,4:56 AM

## 2014-07-14 NOTE — Interval H&P Note (Signed)
Cath Lab Visit (complete for each Cath Lab visit)  Clinical Evaluation Leading to the Procedure:   ACS: No.  Non-ACS:    Anginal Classification: CCS III  Anti-ischemic medical therapy: Maximal Therapy (2 or more classes of medications)  Non-Invasive Test Results: No non-invasive testing performed  Prior CABG: No previous CABG      History and Physical Interval Note:  07/14/2014 1:54 PM  [name]  has presented today for surgery, with the diagnosis of AS, HF  The various methods of treatment have been discussed with the patient and family. After consideration of risks, benefits and other options for treatment, the patient has consented to  Procedure(s): LEFT AND RIGHT HEART CATHETERIZATION WITH CORONARY ANGIOGRAM (N/A) as a surgical intervention .  The patient's history has been reviewed, patient examined, no change in status, stable for surgery.  I have reviewed the patient's chart and labs.  Questions were answered to the patient's satisfaction.     Clent Demark

## 2014-07-14 NOTE — CV Procedure (Signed)
Right and left cardiac cath report dictated on 07/14/2014 dictation number is [phone]

## 2014-07-14 NOTE — H&P (View-Only) (Signed)
Subjective:  Patient denies any chest pain states breathing is improved.  Patient underwent TEE/cardioversion earlier this morning tolerated procedure well.results of CT noted which showed aneurysmal dilatation of the aortic root.  Also noted to have right upper lobe pulmonary nodule suspicious for lung malignancy.  Objective:  Vital Signs in the last 24 hours: Temp:  [97.4 F (36.3 C)-98 F (36.7 C)] 97.9 F (36.6 C) (03/14 1640) Pulse Rate:  [56-98] 76 (03/14 1702) Resp:  [12-22] 18 (03/14 1640) BP: (100-133)/(57-83) 117/63 mmHg (03/14 1702) SpO2:  [94 %-100 %] 98 % (03/14 1640) Weight:  [86.229 kg (190 lb 1.6 oz)] 86.229 kg (190 lb 1.6 oz) (03/14 0530)  Intake/Output from previous day: 03/13 0701 - 03/14 0700 In: 993 [P.O.:780; I.V.:213] Out: 1250 [Urine:1250] Intake/Output from this shift: Total I/O In: 1420 [P.O.:1170; I.V.:250] Out: 500 [Urine:500]  Physical Exam: Neck: no adenopathy, no carotid bruit, no JVD and supple, symmetrical, trachea midline Lungs: clear to auscultation bilaterally Heart: regular rate and rhythm, S1, S2 normal and soft systolic and diastolic murmur noted Abdomen: soft, non-tender; bowel sounds normal; no masses,  no organomegaly Extremities: extremities normal, atraumatic, no cyanosis or edema  Lab Results:  Recent Labs  07/13/14 0356  WBC 5.1  HGB 14.7  PLT 171    Recent Labs  07/12/14 0555 07/13/14 0356  NA 136 138  K 3.7 4.0  CL 100 106  CO2 26 24  GLUCOSE 133* 113*  BUN 17 15  CREATININE 1.04 0.88    Recent Labs  07/10/14 2000  TROPONINI <0.03   Hepatic Function Panel No results for input(s): PROT, ALBUMIN, AST, ALT, ALKPHOS, BILITOT, BILIDIR, IBILI in the last 72 hours. No results for input(s): CHOL in the last 72 hours. No results for input(s): PROTIME in the last 72 hours.  Imaging: Imaging results have been reviewed and No results found.  Cardiac Studies:  Assessment/Plan:  Compensated systolic heart  failure Dilated cardiomyopathy Status post A. Fib with RVR status post cardioversion Ascending thoracic aortic aneurysm Right lung nodule Hypertension P diabetic COPD Hypercholesterolemia Degenerative joint disease Plan Discussed with patient regarding left cardiac catheterization prior to possible aortic root /surgical history his risk and benefits i.e. Death, myocardial infarction,stroke need for emergency CABG local vascular complications etc. And consents the procedure.  LOS: 3 days    [name] N 07/13/2014, 5:34 PM

## 2014-07-14 NOTE — Progress Notes (Signed)
Site area: RFA Site Prior to Removal:  Level 0 Pressure Applied For:82min Manual:   yes Patient Status During Pull:  stable Post Pull Site:  Level0 Post Pull Instructions Given:  yes Post Pull Pulses Present: palpable Dressing Applied:  clear Bedrest begins @ 1545 Comments:

## 2014-07-14 NOTE — Progress Notes (Signed)
VASCULAR LAB PRELIMINARY  PRELIMINARY  PRELIMINARY  PRELIMINARY  Pre-op Cardiac Surgery  Carotid Findings:  Bilateral:  1-39% ICA stenosis.  Vertebral artery flow is antegrade.     Upper Extremity Right Left  Brachial Pressures 136 Triphasic 136 Triphasic  Radial Waveforms Triphasic Triphasic  Ulnar Waveforms Triphasic Triphasic  Palmar Arch (Allen's Test) Normal Abnormal   Findings:  Doppler waveforms remained normal with both radial and ulnar compression on the right. Left Doppler waveforms remained normal with radial compression and reversed with ulnar compression    Lower  Extremity Right Left  Dorsalis Pedis    Anterior Tibial    Posterior Tibial    Ankle/Brachial Indices      Findings:  Pedal pulses were palpable bilaterally .   Lorelie Biermann, RVS 07/14/2014, 6:46 PM

## 2014-07-14 NOTE — Progress Notes (Signed)
Patient has returned to room fro cath lab. Patient alert and oriented x4 and has no complaints. Patient on bedrest until 2145, patient aware. Patient placed on telemetry and CCMD notified. VS stable.  Call light in reach. Will continue to monitor

## 2014-07-15 ENCOUNTER — Encounter (HOSPITAL_COMMUNITY)
Admission: EM | Disposition: A | Discharge status: Home / Self Care | Payer: Self-pay | Source: Home / Self Care | Attending: Cardiology

## 2014-07-15 ENCOUNTER — Encounter (HOSPITAL_COMMUNITY): Payer: Self-pay | Admitting: Cardiology

## 2014-07-15 DIAGNOSIS — R911 Solitary pulmonary nodule: Secondary | ICD-10-CM

## 2014-07-15 DIAGNOSIS — I77819 Aortic ectasia, unspecified site: Secondary | ICD-10-CM

## 2014-07-15 DIAGNOSIS — I509 Heart failure, unspecified: Secondary | ICD-10-CM

## 2014-07-15 LAB — CBC
HCT: 43.4 % (ref 39.0–52.0)
Hemoglobin: 14.4 g/dL (ref 13.0–17.0)
MCH: 30.1 pg (ref 26.0–34.0)
MCHC: 33.2 g/dL (ref 30.0–36.0)
MCV: 90.8 fL (ref 78.0–100.0)
PLATELETS: 168 10*3/uL (ref 150–400)
RBC: 4.78 MIL/uL (ref 4.22–5.81)
RDW: 13 % (ref 11.5–15.5)
WBC: 4.8 10*3/uL (ref 4.0–10.5)

## 2014-07-15 SURGERY — BENTALL PROCEDURE
Anesthesia: General

## 2014-07-15 MED ORDER — CARVEDILOL 6.25 MG PO TABS: 6.2500 mg | ORAL_TABLET | Freq: Two times a day (BID) | ORAL | Status: DC

## 2014-07-15 MED ORDER — AMIODARONE HCL 200 MG PO TABS: 200.0000 mg | ORAL_TABLET | Freq: Every day | ORAL | Status: AC

## 2014-07-15 MED ORDER — LISINOPRIL 5 MG PO TABS: 5.0000 mg | ORAL_TABLET | Freq: Every day | ORAL | Status: DC

## 2014-07-15 NOTE — Discharge Instructions (Signed)
Information on my medicine - ELIQUIS (apixaban)  Why was Eliquis prescribed for you? Eliquis was prescribed for you to reduce the risk of a blood clot forming that can cause a stroke if you have a medical condition called atrial fibrillation (a type of irregular heartbeat).  What do You need to know about Eliquis ? Take your Eliquis TWICE DAILY - one tablet in the morning and one tablet in the evening with or without food. If you have difficulty swallowing the tablet whole please discuss with your pharmacist how to take the medication safely.  Take Eliquis exactly as prescribed by your doctor and DO NOT stop taking Eliquis without talking to the doctor who prescribed the medication.  Stopping may increase your risk of developing a stroke.  Refill your prescription before you run out.  After discharge, you should have regular check-up appointments with your healthcare provider that is prescribing your Eliquis.  In the future your dose may need to be changed if your kidney function or weight changes by a significant amount or as you get older.  What do you do if you miss a dose? If you miss a dose, take it as soon as you remember on the same day and resume taking twice daily.  Do not take more than one dose of ELIQUIS at the same time to make up a missed dose.  Important Safety Information A possible side effect of Eliquis is bleeding. You should call your healthcare provider right away if you experience any of the following: ? Bleeding from an injury or your nose that does not stop. ? Unusual colored urine (red or dark brown) or unusual colored stools (red or black). ? Unusual bruising for unknown reasons. ? A serious fall or if you hit your head (even if there is no bleeding).  Some medicines may interact with Eliquis and might increase your risk of bleeding or clotting while on Eliquis. To help avoid this, consult your healthcare provider or pharmacist prior to using any new  prescription or non-prescription medications, including herbals, vitamins, non-steroidal anti-inflammatory drugs (NSAIDs) and supplements.  This website has more information on Eliquis (apixaban): http://www.eliquis.com/eliquis/home  Heart Failure Heart failure is a condition in which the heart has trouble pumping blood. This means your heart does not pump blood efficiently for your body to work well. In some cases of heart failure, fluid may back up into your lungs or you may have swelling (edema) in your lower legs. Heart failure is usually a long-term (chronic) condition. It is important for you to take good care of yourself and follow your health care provider's treatment plan. CAUSES  Some health conditions can cause heart failure. Those health conditions include:  High blood pressure (hypertension). Hypertension causes the heart muscle to work harder than normal. When pressure in the blood vessels is high, the heart needs to pump (contract) with more force in order to circulate blood throughout the body. High blood pressure eventually causes the heart to become stiff and weak.  Coronary artery disease (CAD). CAD is the buildup of cholesterol and fat (plaque) in the arteries of the heart. The blockage in the arteries deprives the heart muscle of oxygen and blood. This can cause chest pain and may lead to a heart attack. High blood pressure can also contribute to CAD.  Heart attack (myocardial infarction). A heart attack occurs when one or more arteries in the heart become blocked. The loss of oxygen damages the muscle tissue of the heart. When this  happens, part of the heart muscle dies. The injured tissue does not contract as well and weakens the heart's ability to pump blood.  Abnormal heart valves. When the heart valves do not open and close properly, it can cause heart failure. This makes the heart muscle pump harder to keep the blood flowing.  Heart muscle disease (cardiomyopathy or  myocarditis). Heart muscle disease is damage to the heart muscle from a variety of causes. These can include drug or alcohol abuse, infections, or unknown reasons. These can increase the risk of heart failure.  Lung disease. Lung disease makes the heart work harder because the lungs do not work properly. This can cause a strain on the heart, leading it to fail.  Diabetes. Diabetes increases the risk of heart failure. High blood sugar contributes to high fat (lipid) levels in the blood. Diabetes can also cause slow damage to tiny blood vessels that carry important nutrients to the heart muscle. When the heart does not get enough oxygen and food, it can cause the heart to become weak and stiff. This leads to a heart that does not contract efficiently.  Other conditions can contribute to heart failure. These include abnormal heart rhythms, thyroid problems, and low blood counts (anemia). Certain unhealthy behaviors can increase the risk of heart failure, including:  Being overweight.  Smoking or chewing tobacco.  Eating foods high in fat and cholesterol.  Abusing illicit drugs or alcohol.  Lacking physical activity. SYMPTOMS  Heart failure symptoms may vary and can be hard to detect. Symptoms may include:  Shortness of breath with activity, such as climbing stairs.  Persistent cough.  Swelling of the feet, ankles, legs, or abdomen.  Unexplained weight gain.  Difficulty breathing when lying flat (orthopnea).  Waking from sleep because of the need to sit up and get more air.  Rapid heartbeat.  Fatigue and loss of energy.  Feeling light-headed, dizzy, or close to fainting.  Loss of appetite.  Nausea.  Increased urination during the night (nocturia). DIAGNOSIS  A diagnosis of heart failure is based on your history, symptoms, physical examination, and diagnostic tests. Diagnostic tests for heart failure may include:  Echocardiography.  Electrocardiography.  Chest  X-ray.  Blood tests.  Exercise stress test.  Cardiac angiography.  Radionuclide scans. TREATMENT  Treatment is aimed at managing the symptoms of heart failure. Medicines, behavioral changes, or surgical intervention may be necessary to treat heart failure.  Medicines to help treat heart failure may include:  Angiotensin-converting enzyme (ACE) inhibitors. This type of medicine blocks the effects of a blood protein called angiotensin-converting enzyme. ACE inhibitors relax (dilate) the blood vessels and help lower blood pressure.  Angiotensin receptor blockers (ARBs). This type of medicine blocks the actions of a blood protein called angiotensin. Angiotensin receptor blockers dilate the blood vessels and help lower blood pressure.  Water pills (diuretics). Diuretics cause the kidneys to remove salt and water from the blood. The extra fluid is removed through urination. This loss of extra fluid lowers the volume of blood the heart pumps.  Beta blockers. These prevent the heart from beating too fast and improve heart muscle strength.  Digitalis. This increases the force of the heartbeat.  Healthy behavior changes include:  Obtaining and maintaining a healthy weight.  Stopping smoking or chewing tobacco.  Eating heart-healthy foods.  Limiting or avoiding alcohol.  Stopping illicit drug use.  Physical activity as directed by your health care provider.  Surgical treatment for heart failure may include:  A procedure to  open blocked arteries, repair damaged heart valves, or remove damaged heart muscle tissue.  A pacemaker to improve heart muscle function and control certain abnormal heart rhythms.  An internal cardioverter defibrillator to treat certain serious abnormal heart rhythms.  A left ventricular assist device (LVAD) to assist the pumping ability of the heart. HOME CARE INSTRUCTIONS   Take medicines only as directed by your health care provider. Medicines are  important in reducing the workload of your heart, slowing the progression of heart failure, and improving your symptoms.  Do not stop taking your medicine unless directed by your health care provider.  Do not skip any dose of medicine.  Refill your prescriptions before you run out of medicine. Your medicines are needed every day.  Engage in moderate physical activity if directed by your health care provider. Moderate physical activity can benefit some people. The elderly and people with severe heart failure should consult with a health care provider for physical activity recommendations.  Eat heart-healthy foods. Food choices should be free of trans fat and low in saturated fat, cholesterol, and salt (sodium). Healthy choices include fresh or frozen fruits and vegetables, fish, lean meats, legumes, fat-free or low-fat dairy products, and whole grain or high fiber foods. Talk to a dietitian to learn more about heart-healthy foods.  Limit sodium if directed by your health care provider. Sodium restriction may reduce symptoms of heart failure in some people. Talk to a dietitian to learn more about heart-healthy seasonings.  Use healthy cooking methods. Healthy cooking methods include roasting, grilling, broiling, baking, poaching, steaming, or stir-frying. Talk to a dietitian to learn more about healthy cooking methods.  Limit fluids if directed by your health care provider. Fluid restriction may reduce symptoms of heart failure in some people.  Weigh yourself every day. Daily weights are important in the early recognition of excess fluid. You should weigh yourself every morning after you urinate and before you eat breakfast. Wear the same amount of clothing each time you weigh yourself. Record your daily weight. Provide your health care provider with your weight record.  Monitor and record your blood pressure if directed by your health care provider.  Check your pulse if directed by your health  care provider.  Lose weight if directed by your health care provider. Weight loss may reduce symptoms of heart failure in some people.  Stop smoking or chewing tobacco. Nicotine makes your heart work harder by causing your blood vessels to constrict. Do not use nicotine gum or patches before talking to your health care provider.  Keep all follow-up visits as directed by your health care provider. This is important.  Limit alcohol intake to no more than 1 drink per day for nonpregnant women and 2 drinks per day for men. One drink equals 12 ounces of beer, 5 ounces of wine, or 1 ounces of hard liquor. Drinking more than that is harmful to your heart. Tell your health care provider if you drink alcohol several times a week. Talk with your health care provider about whether alcohol is safe for you. If your heart has already been damaged by alcohol or you have severe heart failure, drinking alcohol should be stopped completely.  Stop illicit drug use.  Stay up-to-date with immunizations. It is especially important to prevent respiratory infections through current pneumococcal and influenza immunizations.  Manage other health conditions such as hypertension, diabetes, thyroid disease, or abnormal heart rhythms as directed by your health care provider.  Learn to manage stress.  Plan  rest periods when fatigued.  Learn strategies to manage high temperatures. If the weather is extremely hot:  Avoid vigorous physical activity.  Use air conditioning or fans or seek a cooler location.  Avoid caffeine and alcohol.  Wear loose-fitting, lightweight, and light-colored clothing.  Learn strategies to manage cold temperatures. If the weather is extremely cold:  Avoid vigorous physical activity.  Layer clothes.  Wear mittens or gloves, a hat, and a scarf when going outside.  Avoid alcohol.  Obtain ongoing education and support as needed.  Participate in or seek rehabilitation as needed to  maintain or improve independence and quality of life. SEEK MEDICAL CARE IF:   Your weight increases by 03 lb/1.4 kg in 1 day or 05 lb/2.3 kg in a week.  You have increasing shortness of breath that is unusual for you.  You are unable to participate in your usual physical activities.  You tire easily.  You cough more than normal, especially with physical activity.  You have any or more swelling in areas such as your hands, feet, ankles, or abdomen.  You are unable to sleep because it is hard to breathe.  You feel like your heart is beating fast (palpitations).  You become dizzy or light-headed upon standing up. SEEK IMMEDIATE MEDICAL CARE IF:   You have difficulty breathing.  There is a change in mental status such as decreased alertness or difficulty with concentration.  You have a pain or discomfort in your chest.  You have an episode of fainting (syncope). MAKE SURE YOU:   Understand these instructions.  Will watch your condition.  Will get help right away if you are not doing well or get worse. Document Released: 04/17/2005 Document Revised: 09/01/2013 Document Reviewed: 05/17/2012 [facility] Patient Information 2015 [place], [place]. This information is not intended to replace advice given to you by your health care provider. Make sure you discuss any questions you have with your health care provider.

## 2014-07-15 NOTE — Cardiovascular Report (Signed)
NAMEBACILIO, ABASCAL             ACCOUNT NO.:  [id number]  MEDICAL RECORD NO.:  [id number]  LOCATION:  3E02C                        FACILITY:  [facility]  PHYSICIAN:  Allegra Lai. Terrence Dupont, M.D. DATE OF BIRTH:  23-Jun-1940  DATE OF PROCEDURE:  07/14/2014 DATE OF DISCHARGE:                           CARDIAC CATHETERIZATION   PROCEDURE:  Left and right cardiac cath with selective left and right coronary angiography, LV graphy, insertion of Swan-Ganz catheter via right groin using Judkins technique.  INDICATION FOR THE PROCEDURE:  [name] is a 74 year old male with past medical history significant for mild coronary artery disease in the past, hypertension, prediabetic, hypercholesteremia, degenerative joint disease, COPD, chronic atrial fibrillation, treated with rate control and anticoagulation.  He came to the ER complaining of progressive increasing shortness of breath associated with coughing for approximately 2 weeks.  The patient was seen at [facility] few days ago, and had chest x-ray, which was negative but showed signs of COPD and was started yesterday on Z-Pak without much improvement, so decided to come to ED for further evaluation.  The patient was noted to be in AFib with moderate ventricular response with mild CHF.  The patient received IV Lasix with improvement in his symptoms.  The patient subsequently had 2D echo, which showed ascending aorta and aortic root was moderately dilated, subsequently underwent CT angio of the chest, which showed thoracic aortic aneurysm with a 5.7 cm diameter at the level of sinus of Valsalva and also was noted to have 12 x 7 mm right upper lung lobe nodule.  The patient subsequently underwent TEE cardioversion.  CVTS consultation was obtained and was recommended to have right and left cardiac cath prior to possible CABG and aortic root replacement surgery.  DESCRIPTION OF PROCEDURE:  After obtaining the informed consent, the patient  was brought to the cath lab and was placed on fluoroscopy table. Right groin was prepped and draped in usual fashion.  1% Xylocaine was used for local anesthesia in the right groin.  With the help of thin- walled needle, 5-French arterial and 7-French venous sheaths were placed.  Both the sheaths were aspirated and flushed.  Next, 7-French thermodilution Swan-Ganz catheter was advanced under fluoroscopic guidance up to the RA, RV, PA, and into wedge position. Pressures were recorded in above locations.  Next, cardiac outputs were done using thermodilution method.  The Swan findings were RA pressure was 3 mmHg, RV was 26/1, PA was 24/7, wedge pressure was 16.  Cardiac output by Fick was 4.10 and by thermodilution it was 4.65.  A 5-French left Judkins catheter was advanced over the wire under fluoroscopic guidance up to the ascending aorta.  Wire was pulled out. The catheter was aspirated and connected to the Manifold.  Catheter was further advanced and engaged into left coronary ostium.  Multiple views of the left system were taken.  Next, catheter was disengaged and was pulled out over the wire and was replaced with 5-French right Judkins catheter, which was advanced over the wire under fluoroscopic guidance up to the ascending aorta.  Wire was pulled out.  The catheter was aspirated and connected to the Manifold.  Catheter was further advanced across the aortic valve and  further advanced and engaged into right coronary ostium.  Multiple views of the right system were taken.  Next, catheter was disengaged and was pulled out over the wire and was replaced with 5-French pigtail catheter, which was advanced over the wire under fluoroscopic guidance up to the ascending aorta.  Wire was pulled out.  The catheter was aspirated and connected to the Manifold. Catheter was further advanced across the aortic valve into the LV.  LV pressures were recorded.  Next, LV graphy was done in 30-degree  RAO position.  Post-angiographic pressures were recorded from LV and then pullback pressures were recorded from the aorta.  There was no gradient across the aortic valve.  Next, the pigtail catheter was pulled out, the sheaths were aspirated and flushed.  FINDINGS:  LV showed LV was moderately enlarged.  There was global hypokinesia.  EF of approximately 20% to 25%.  Left main was very short, which was patent.  LAD was patent.  Diagonal 1 and 2 were patent.  Left circumflex was patent.  OM-1 was small, which was patent.  OM-2 was moderate sized, which was patent.  RCA was patent.  PDA and PLV branches were patent.  The patient tolerated the procedure well.  There were no complications. The patient was transferred to recovery room in stable condition  PLAN:  To maximize beta-blockers and ACE inhibitors.  We will start Eliquis tomorrow and possibly discharge tomorrow.  The patient will be scheduled for PET scan as an outpatient and open heart surgery for aortic root replacement, possibly aortic valve as an outpatient.     Allegra Lai. Terrence Dupont, M.D.     MNH/MEDQ  D:  07/14/2014  T:  07/15/2014  Job:  [id number]  cc:   Allegra Lai. Terrence Dupont, M.D.

## 2014-07-15 NOTE — Progress Notes (Addendum)
[id number] Cardiac Rehab Completed Afib, CHF and pre-op heart surgery education with pt. He voices understanding. Education was difficult due to pt talking a lot and telling stories. I gave him off the beat booklet, CHF packet and surgery education booklet. He states that he is very active at home and loves to walk. Pt admits that he is not as fast as he use to be, but he still moves a lot. We discussed CHF zones, daily weights, sodium and fluid restrictions, when to call MD and 911. I discussed with him sternal precautions, walking post-op and use of IS. I gave pt IS and encouraged him to practice it at home. I placed Afib video on for him to watch and will ask his nurse to put CHF and heart surgery videos on for him. Pt lives alone and knows that he will need a short term rehab stay and he agrees to that. Deon Pilling, RN 07/15/2014 9:53 AM

## 2014-07-15 NOTE — Progress Notes (Signed)
Patient alert and oriented, patient denies pain, dizziness or shortness of breath. Patient ambulate by self in hall way. V/S stable, iv  And tele d/c, disharge instruction  Given, pt. Verbalized understanding. Will d/c patient home per order.

## 2014-07-15 NOTE — Discharge Summary (Signed)
D/C Summary  [phone]

## 2014-07-15 NOTE — Consult Note (Signed)
[address]       [place] [address]             [phone]        [facility] Medical Record #[id number] Date of Birth: 02-Dec-1940  Referring: Dr Terrence Dupont  Primary Care: Clent Demark, MD  Chief Complaint:    Chief Complaint  Patient presents with  . Shortness of Breath    History of Present Illness:     Patient is 74 year old male withhistory significant for mild coronary artery disease cath 2007, hypertension, prediabetic, hypercholesterolemia, degenerative joint disease, COPD, chronic atrial fibrillation, treated with rate control and anticoagulants. He was seen in the  [facility] a few days ago, had chest x-ray which was negative showed signs of COPD and was started yesterday on Z-Pak without much improvement. He came  to the [facility]  ED for further evaluation complaining of progressive increasing shortness of breath associated with coughing for approximately 2 weeks.  Patient was noted to be in A. fib with moderate ventricular response and mild CHF. Patient received IV Lasix with improvement in his symptoms. Patient notes are new onset over the past several weeks.  Cardiac cath done yesterday and cardioversion two days ago. Feels much better. No orthopnea or SOB. Holding sinus.   Current Activity/ Functional Status:    Zubrod Score: At the time of surgery this patient's most appropriate activity status/level should be described as: []     0    Normal activity, no symptoms [x]     1    Restricted in physical strenuous activity but ambulatory, able to do out light work []     2    Ambulatory and capable of self care, unable to do work activities, up and about                 more than 50%  Of the time                            []     3    Only limited self care, in bed greater than 50% of waking hours []     4    Completely disabled, no self care, confined to bed or chair []     5    Moribund  Past Medical History  Diagnosis Date  .  A-fib   . Hypertension   . Asthma   . CHF (congestive heart failure)   History of GI Bleed  In 1987   Previous history of bilaterial knee replacement, cholecystectomy, inguinal hernias repairs   History  Smoking status  .  smoker for 4-5 years quit in 1960  Smokeless tobacco  . none    History  Alcohol Use No    History   Social History  . Marital Status: Married    Spouse Name: N/A  . Number of Children: None    Occupational History  . retired from [employer]     No Known Allergies  Current Facility-Administered Medications  Medication Dose Route Frequency Provider Last Rate Last Dose  . 0.9 %  sodium chloride infusion  250 mL Intravenous PRN Dixie Dials, MD      . 0.9 %  sodium chloride infusion   Intravenous Continuous Charolette Forward, MD   Stopped at 07/15/14 0436  . acetaminophen (TYLENOL) tablet 650 mg  650 mg Oral Q4H PRN Charolette Forward, MD      .  albuterol (PROVENTIL) (2.5 MG/3ML) 0.083% nebulizer solution 3 mL  3 mL Inhalation Q4H PRN Charolette Forward, MD   3 mL at 07/13/14 1500  . ALPRAZolam Duanne Moron) tablet 0.25 mg  0.25 mg Oral BID Charolette Forward, MD   0.25 mg at 07/14/14 2239  . amiodarone (PACERONE) tablet 200 mg  200 mg Oral BID Charolette Forward, MD   200 mg at 07/14/14 2239  . apixaban (ELIQUIS) tablet 5 mg  5 mg Oral BID Charolette Forward, MD   5 mg at 07/14/14 2239  . atorvastatin (LIPITOR) tablet 20 mg  20 mg Oral q1800 Charolette Forward, MD   20 mg at 07/14/14 1700  . carvedilol (COREG) tablet 3.125 mg  3.125 mg Oral BID WC Dixie Dials, MD   3.125 mg at 07/14/14 1657  . lisinopril (PRINIVIL,ZESTRIL) tablet 5 mg  5 mg Oral Daily Charolette Forward, MD   5 mg at 07/14/14 1721  . mometasone-formoterol (DULERA) 100-5 MCG/ACT inhaler 2 puff  2 puff Inhalation BID Charolette Forward, MD   2 puff at 07/15/14 0742  . ondansetron (ZOFRAN) injection 4 mg  4 mg Intravenous Q6H PRN Charolette Forward, MD      . sodium chloride 0.9 % injection 3 mL  3 mL Intravenous Q12H Charolette Forward, MD   3 mL at 07/12/14 0949  . sodium chloride 0.9 % injection 3 mL  3 mL Intravenous PRN Charolette Forward, MD      . spironolactone (ALDACTONE) tablet 25 mg  25 mg Oral Daily Charolette Forward, MD   25 mg at 07/14/14 1011  . tiotropium (SPIRIVA) inhalation capsule 18 mcg  18 mcg Inhalation Daily Charolette Forward, MD   18 mcg at 07/15/14 9983    Prescriptions prior to admission  Medication Sig Dispense Refill Last Dose  . ADVAIR DISKUS 250-50 MCG/DOSE AEPB Inhale 1 puff into the lungs 2 (two) times daily.    07/09/2014 at Unknown time  . apixaban (ELIQUIS) 5 MG TABS tablet Take 5 mg by mouth 2 (two) times daily.   07/09/2014 at Unknown time  . atorvastatin (LIPITOR) 20 MG tablet Take 20 mg by mouth daily at 6 PM.    07/09/2014 at Unknown time  . azithromycin (ZITHROMAX) 250 MG tablet Take 250 mg by mouth daily.    07/09/2014 at Unknown time  . metoprolol succinate (TOPROL-XL) 50 MG 24 hr tablet Take 100 mg by mouth daily.    07/09/2014 at 0800  . PROAIR HFA 108 (90 BASE) MCG/ACT inhaler Inhale 1 puff into the lungs every 4 (four) hours as needed for wheezing or shortness of breath.    07/09/2014 at Unknown time  . SPIRIVA HANDIHALER 18 MCG inhalation capsule Place 18 mcg into inhaler and inhale daily.    07/09/2014 at Unknown time  . spironolactone (ALDACTONE) 25 MG tablet Take 25 mg by mouth daily.    07/09/2014 at Unknown time    Family History: Only child , no children, father died suddenly at home at age 43 , ? MI  No family history of dissection or aortic any serum   Review of Systems:      Cardiac Review of Systems: Y or N  Chest Pain [  n  ]  Resting SOB [ y  ] Exertional SOB  [ y ]  Vertell Limber Blue.Reese  ]   Pedal Edema Blue.Reese   ]    Palpitations Blue.Reese  ] Syncope  [ n ]   Presyncope [  y ]  General Review  of Systems: [Y] = yes [  ]=no Constitional: recent weight change Vixen.Miu  ]; anorexia [  ]; fatigue Blue.Reese  ]; nausea [  ]; night sweats [  ]; fever [n  ]; or chills [ n ]                                                                Dental: poor dentition[ y ]; Last Dentist visit:  Has upper plates and lower implants   Eye : blurred vision [ n ]; diplopia [   ]; vision changes [  ];  Amaurosis fugax[  ]; Resp: cough Blue.Reese  ];  wheezing[ y ];  hemoptysis[ n ]; shortness of breath[ y ]; paroxysmal nocturnal dyspnea[  ]; dyspnea on exertion[y  ]; or orthopnea[y  ];  GI:  gallstones[  ], vomiting[  ];  dysphagia[  ]; melena[  ];  hematochezia [  ]; heartburn[  ];   Hx of  Colonoscopy[y  ]; GU: kidney stones [  ]; hematuria[ n ];   dysuria [  ];  nocturia[  ];  history of     obstruction [  ]; urinary frequency [ n ]             Skin: rash, swelling[  ];, hair loss[  ];  peripheral edema[  ];  or itching[  ]; Musculosketetal: myalgias[  ];  joint swelling[ y ];  joint erythema[ y ];  joint pain[  ];  back pain[  ];  Heme/Lymph: bruising[  ];  bleeding[  ];  anemia[  ];  Neuro: TIA[  ];  headaches[  ];  stroke[  ];  vertigo[  ];  seizures[ n ];   paresthesias[  ];  difficulty walking[ n ];  Psych:depression[  ]; anxiety[  ];  Endocrine: diabetes[n  ];  thyroid dysfunction[ n ];  Immunizations: Flu Florencio.Farrier  ]; Pneumococcal[  n];  Other:  Physical Exam: BP 91/68 mmHg  Pulse 72  Temp(Src) 97.5 F (36.4 C) (Oral)  Resp 18  Ht 6\' 2"  (1.88 m)  Wt 188 lb 8 oz (85.503 kg)  BMI 24.19 kg/m2  SpO2 95%   General appearance: alert, cooperative, appears older than stated age and no distress Head: Normocephalic, without obvious abnormality, atraumatic Neck: no adenopathy, no carotid bruit, no JVD, supple, symmetrical, trachea midline and thyroid not enlarged, symmetric, no tenderness/mass/nodules Lymph nodes: Cervical, supraclavicular, and axillary nodes normal. Resp: clear to auscultation bilaterally Back: symmetric, no curvature. ROM normal. No CVA tenderness. Cardio: now regular   Rhythm, ii/vi m of AI GI: soft, non-tender; bowel sounds normal; no masses,  no organomegaly Extremities: extremities normal,  atraumatic, no cyanosis or edema and Homans sign is negative, no sign of DVT Neurologic: Grossly normal No carotid bruits Palpable dp and pt pulses, bilateral bilateral knee incisions from knee replacement   Diagnostic Studies & Laboratory data:     Recent Radiology Findings:   Ct Angio Chest Pe W/cm &/or Wo Cm  07/10/2014   CLINICAL DATA:  74 year old male with increasing shortness of breath for the past 2 weeks, cough, chest tightness and shortness of breath, which worsens when lying flat. History of atrial fibrillation. History of thoracic aortic aneurysm.  EXAM: CT ANGIOGRAPHY CHEST, ABDOMEN AND PELVIS  TECHNIQUE: Multidetector CT imaging through the chest, abdomen and pelvis was performed using the standard protocol during bolus administration of intravenous contrast. Multiplanar reconstructed images and MIPs were obtained and reviewed to evaluate the vascular anatomy.  CONTRAST:  132mL OMNIPAQUE IOHEXOL 350 MG/ML SOLN  COMPARISON:  None.  FINDINGS: CTA CHEST FINDINGS  Mediastinum/Lymph Nodes: Aneurysmal dilatation of the aortic root which measures 3.2 cm in diameter at the annulus, but 5.7 cm in diameter at the level of the sinuses of Valsalva. The sino-tubular junction is effaced. The thoracic aortic arch is normal in caliber at 2.8 cm. The isthmus of the thoracic aorta is mildly ectatic measuring 4 cm in diameter. The descending thoracic aorta is normal in caliber at 3 cm in diameter. No evidence of thoracic aortic dissection. Heart size is mildly enlarged. There is no significant pericardial fluid, thickening or pericardial calcification. There is atherosclerosis of the thoracic aorta, the great vessels of the mediastinum and the coronary arteries, including calcified atherosclerotic plaque in the left main and left anterior descending coronary arteries. Multiple borderline enlarged and mildly enlarged mediastinal and right hilar lymph nodes, largest of which is in the low right paratracheal nodal  station measuring 14 mm in short axis. Multiple densely calcified right hilar and mediastinal lymph nodes, presumably from old granulomatous disease. Esophagus is normal in appearance. Multiple borderline enlarged and mildly enlarged left axillary lymph nodes measuring up to 10 mm in short axis.  Lungs/Pleura: Large calcified granuloma in the posterior aspect of the right lower lobe with adjacent scarring. 12 x 7 mm right upper lobe pulmonary nodule (image 29 of series 506). No acute consolidative airspace disease. No pleural effusions. Diffuse subpleural bullae are noted in the apices of the lungs bilaterally. Small left-sided Bochdalek's hernia.  Musculoskeletal/Soft Tissues: There are no aggressive appearing lytic or blastic lesions noted in the visualized portions of the skeleton.  Review of the MIP images confirms the above findings.  CTA ABDOMEN AND PELVIS FINDINGS  Hepatobiliary: Status post cholecystectomy. 8 mm low attenuation lesion in segment 6 of the liver (image 180 of series 501), too small to characterize, but statistically likely a tiny cyst. No other suspicious appearing hepatic lesions. No intra or extrahepatic biliary ductal dilatation.  Pancreas: Unremarkable.  Spleen: Multiple calcifications in the spleen, and irregular splenic contour, which could relate to remote trauma or prior splenic infarcts.  Adrenals/Urinary Tract: Bilateral kidneys and bilateral adrenal glands are normal in appearance. No hydroureteronephrosis. Urinary bladder is normal in appearance.  Stomach/Bowel: Who normal appearance of the stomach. No pathologic dilatation of small bowel or colon. Normal appendix.  Vascular/Lymphatic: Atherosclerosis throughout the abdominal and pelvic vasculature. Aneurysmal dilatation of the common iliac arteries bilaterally which measure up to 1.6 cm in diameter bilaterally. No evidence of dissection. No lymphadenopathy noted in the abdomen or pelvis.  Reproductive: Prostate gland is enlarged  and heterogeneous in appearance measuring 6.3 x 4.1 cm. Seminal vesicles are unremarkable in appearance.  Other: No significant volume of ascites.  No pneumoperitoneum.  Musculoskeletal: There are no aggressive appearing lytic or blastic lesions noted in the visualized portions of the skeleton.  Review of the MIP images confirms the above findings.  IMPRESSION: 1. Aneurysmal dilatation of the aortic root, which measures up to 5.7 cm in diameter at the level of the sinuses of Valsalva. There is effacement of the sino-tubular junction. This is commonly seen in the setting of Marfan syndrome. No evidence of aortic dissection at this time. There is also some ectasia at  of the isthmus of the thoracic aorta which measures up to 4 cm in diameter. 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 2. 12 x 7 mm right upper lobe pulmonary nodule (image 29 of series 506). Followup evaluation with PET-CT is recommended in the near future to evaluate for potential malignancy. 3. Left main and left anterior descending coronary artery disease. Assessment for potential risk factor modification, dietary therapy or pharmacologic therapy may be warranted, if clinically indicated. 4. Mild cardiomegaly. 86. Sequela of old granulomatous disease, as above. Multiple borderline enlarged and mildly enlarged mediastinal and right hilar lymph nodes that are noncalcified are also noted, as well as borderline enlarged and minimally enlarged left axillary lymph nodes. These are nonspecific, but clinical correlation to exclude the possibility of underlying lymphoproliferative disorder may be appropriate. 6. Additional incidental findings, as above.   Electronically Signed   By: Vinnie Langton M.D.   On: 07/10/2014 21:47   Dg Chest Portable 1 View  07/10/2014   CLINICAL DATA:  Shortness of breath and chest discomfort.  EXAM: PORTABLE CHEST - 1 VIEW  COMPARISON:   Remote frontal and lateral views 10/23/2007  FINDINGS: The heart is enlarged. Mild prominence of central pulmonary vasculature without pulmonary edema. Lungs remain hyperinflated. No consolidation, pleural effusion, or pneumothorax. Minimal atelectasis or scarring at the left lung base.  IMPRESSION: 1. Mild cardiomegaly. 2. Hyperinflation with left basilar atelectasis.   Electronically Signed   By: Jeb Levering M.D.   On: 07/10/2014 02:25     I have independently reviewed the above radiologic studies.  Cardiac Cath:FINDINGS: LV showed LV was moderately enlarged. There was global hypokinesia. EF of approximately 20% to 25%. Left main was very short, which was patent. LAD was patent. Diagonal 1 and 2 were patent. Left circumflex was patent. OM-1 was small, which was patent. OM-2 was moderate sized, which was patent. RCA was patent. PDA and PLV branches were patent.  The patient tolerated the procedure well. There were no complications. The patient was transferred to recovery room in stable condition  PLAN: To maximize beta-blockers and ACE inhibitors. We will start Eliquis tomorrow and possibly discharge tomorrow. The patient will be scheduled for PET scan as an outpatient and open heart surgery for aortic root replacement, possibly aortic valve as an outpatient.   Allegra Lai. Terrence Dupont, M.D.   I have independently reviewed the above  cath films and reviewed the findings with the  patient .   Recent Lab Findings: Lab Results  Component Value Date   WBC 4.8 07/15/2014   HGB 14.4 07/15/2014   HCT 43.4 07/15/2014   PLT 168 07/15/2014   GLUCOSE 113* 07/13/2014   ALT 149* 07/10/2014   AST 79* 07/10/2014   NA 138 07/13/2014   K 4.0 07/13/2014   CL 106 07/13/2014   CREATININE 0.88 07/13/2014   BUN 15 07/13/2014   CO2 24 07/13/2014   INR 1.47 07/14/2014   NO recent Cardiac CATH: TTE:  EF 25-30 %, LV ID, ED, PLAX chordal     (H)   67.9 mm    Echocardiography  Patient:  [name], [name] MR #:    [id number] Study Date: 07/10/2014 Gender:   M Age:    45 Height:   188 cm Weight:   90.3 kg BSA:    2.18 m^2 Pt. Status: Room:    3E02C  ADMITTING  Charolette Forward, MD ATTENDING  Charolette Forward,  MD ORDERING   Charolette Forward, MD PERFORMING  Charolette Forward, MD REFERRING  Charolette Forward, MD SONOGRAPHER Delman Kitten, RCS  cc:  ------------------------------------------------------------------- LV EF: 25% -  30%  ------------------------------------------------------------------- Indications:   Atrial fibrillation - 427.31. CHF - 428.0.  ------------------------------------------------------------------- History:  PMH:  Chronic obstructive pulmonary disease. Risk factors: Dyslipidemia.  ------------------------------------------------------------------- Study Conclusions  - Left ventricle: The cavity size was moderately dilated. Systolic function was severely reduced. The estimated ejection fraction was in the range of 25% to 30%. Diffuse hypokinesis. - Aortic valve: There was mild regurgitation. - Left atrium: The atrium was mildly dilated. - Atrial septum: No defect or patent foramen ovale was identified. - Pulmonary arteries: Systolic pressure was mildly to moderately increased. PA peak pressure: 32 mm Hg (S).  Echocardiography. M-mode, complete 2D, spectral Doppler, and color Doppler. Birthdate: Patient birthdate: [birth year]. Age: Patient is 74 yr old. Sex: Gender: male.  BMI: 25.6 kg/m^2. Blood pressure:   128/78 Patient status: Inpatient. Study date: Study date: 07/10/2014. Study time: 01:10 PM. Location: Echo laboratory.  -------------------------------------------------------------------  ------------------------------------------------------------------- Left ventricle: The cavity size was moderately dilated. Systolic function was  severely reduced. The estimated ejection fraction was in the range of 25% to 30%. Diffuse hypokinesis.  ------------------------------------------------------------------- Aortic valve:  Trileaflet; mildly thickened leaflets. Doppler: There was mild regurgitation.  ------------------------------------------------------------------- Aorta: The aorta was moderately dilated. There was no evidence for dissection. Ascending aorta: The ascending aorta was normal in size.  ------------------------------------------------------------------- Mitral valve:  Structurally normal valve.  Leaflet separation was normal. Doppler: Transvalvular velocity was within the normal range. There was no evidence for stenosis. There was trivial regurgitation.  ------------------------------------------------------------------- Left atrium: The atrium was mildly dilated.  ------------------------------------------------------------------- Atrial septum: No defect or patent foramen ovale was identified.  ------------------------------------------------------------------- Right ventricle: The cavity size was normal. Wall thickness was normal. Systolic function was normal.  ------------------------------------------------------------------- Pulmonic valve:  Structurally normal valve.  Cusp separation was normal. Doppler: Transvalvular velocity was within the normal range. There was no regurgitation.  ------------------------------------------------------------------- Tricuspid valve:  Structurally normal valve.  Leaflet separation was normal. Doppler: Transvalvular velocity was within the normal range. There was mild regurgitation.  ------------------------------------------------------------------- Pulmonary artery:  Systolic pressure was mildly to moderately increased.  ------------------------------------------------------------------- Right atrium: The atrium was normal in  size.  ------------------------------------------------------------------- Pericardium: There was no pericardial effusion.  ------------------------------------------------------------------- Post procedure conclusions Ascending Aorta:  - The aorta was moderately dilated.  ------------------------------------------------------------------- Measurements  Left ventricle              Value    Reference LV ID, ED, PLAX chordal     (H)   67.9 mm   43 - 52 LV ID, ES, PLAX chordal     (H)   61.9 mm   23 - 38 LV fx shortening, PLAX chordal  (L)   9   %   >=29 LV PW thickness, ED           11.1 mm   --------- IVS/LV PW ratio, ED           0.98     <=1.3  Ventricular septum            Value    Reference IVS thickness, ED            10.9 mm   ---------  LVOT                   Value    Reference LVOT ID, S  26  mm   --------- LVOT area                5.31 cm^2  ---------  Aortic valve               Value    Reference Aortic regurg pressure half-time     295  ms   ---------  Aorta                  Value    Reference Aortic root ID, ED            48  mm   ---------  Left atrium               Value    Reference LA ID, A-P, ES              47  mm   --------- LA ID/bsa, A-P              2.16 cm/m^2 <=2.2 LA volume, ES, 1-p A4C          56.5 ml   --------- LA volume/bsa, ES, 1-p A4C        25.9 ml/m^2 --------- LA volume, ES, 1-p A2C          96.7 ml   --------- LA volume/bsa, ES, 1-p A2C        44.4 ml/m^2 ---------  Pulmonary arteries            Value    Reference PA pressure, S, DP        (H)   32  mm Hg  <=30  Tricuspid valve             Value    Reference Tricuspid regurg peak velocity      244  cm/s  --------- Tricuspid peak RV-RA gradient      24  mm Hg ---------  Systemic veins              Value    Reference Estimated CVP              8   mm Hg ---------  Right ventricle             Value    Reference RV pressure, S, DP        (H)   32  mm Hg <=30  Legend: (L) and (H) mark values outside specified reference range.  ------------------------------------------------------------------- Prepared and Electronically Authenticated by  Charolette Forward, MD 2016-03-11T17:23:24  PFT's 07/12/2014 21.5 57%  DLCO 25.45 67% Moderately severe Obstructive Airways Disease Response to bronchodilator Airtrapping Mild Diffusion Defect   Assessment / Plan:    1/  Aneurysmal dilatation of the aortic root, which measures up to 5.7 cm in diameter at the level of the sinuses of Valsalva.   No evidence of aortic dissection at this time. 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 5/Left main and left anterior  descending coronary artery calcification on CT but without significant CAD at cath 6/ AFib- now in sinus on Elliquis 7/ COPD- Moderately severe Obstructive Airways Disease, Response to bronchodilator, Airtrapping, Mild Diffusion Defect  Recommend: PET scan  right lung nodule my  office will arrange I will see in office after above to evaluate for root replacement and possible AVR and treatment of right lung nodule With depressed lv function out of proportion to CAD or degree of AI suspect af/tachycaria mediated lv dysfunction, holding  sinus and aggressive treatment of heart failure before proceeding with root replacement would be of benefit   Grace Isaac MD      [address] [place] [address] Office [phone]   Beeper [phone]  07/15/2014 9:08 AM

## 2014-07-16 NOTE — Discharge Summary (Signed)
[name], [name]             ACCOUNT [name].:  [id number]  MEDICAL RECORD [name].:  [id number]  LOCATION:  3E02C                        FACILITY:  [facility]  PHYSICIAN:  Allegra Lai. Terrence Dupont, M.D. DATE OF BIRTH:  May 29, 1940  DATE OF ADMISSION:  07/10/2014 DATE OF DISCHARGE:  07/15/2014                              DISCHARGE SUMMARY   ADMITTING DIAGNOSES: 1. Acute systolic congestive heart failure probably secondary to     tachycardia induced. 2. Atrial fibrillation with moderate ventricular response. 3. Hypertension. 4. Prediabetic. 5. Mild coronary artery disease. 6. Chronic obstructive pulmonary disease. 7. Hypercholesteremia. 8. Degenerative joint disease.  FINAL DIAGNOSES: 1. Compensated systolic heart failure. 2. Dilated nonischemic cardiomyopathy. 3. Status post atrial fibrillation with rapid ventricular response. 4. Status post transesophageal echocardiography cardioversion. 5. Ascending thoracic aneurysm. 6. Right lung nodule. 7. Hypertension. 8. Prediabetic. 9. Chronic obstructive pulmonary disease. 10.Hypercholesteremia. 11.Degenerative joint disease.  DISCHARGE HOME MEDICATIONS:  Advair Diskus 250/50 one puff twice daily, atorvastatin 20 mg 1 tablet daily, carvedilol 6.25 mg 1 tablet twice daily, amiodarone 200 mg 1 tablet daily, lisinopril 5 mg daily, ProAir inhaler 1 puff every 4-6 hours as before, Advair Diskus 1 puff twice daily, Spiriva 1 puff daily, Aldactone 25 mg daily, Lipitor 20 mg daily, Eliquis 5 mg twice daily, and spironolactone 25 mg 1 tablet daily.  DIET:  Low salt, low cholesterol, 1800 calories ADA diet.  ACTIVITY:  Increase activity slowly as tolerated.  The patient has been advised to monitor blood pressure daily and chart.  The patient has been advised to avoid  lifting any weights.  FOLLOWUP:  Follow up with me in 1 week.  Follow up with CVTS as scheduled.  The patient will be scheduled for PET scan as outpatient.  CONDITION AT DISCHARGE:   Stable.  HISTORY AND HOSPITAL COURSE:  [name] is a 74 year old male with past medical history significant for mild coronary artery  disease, hypertension, prediabetic,  hypercholesteremia, degenerative joint disease,  COPD, chronic atrial fibrillation, treated with rate control and anticoagulation diagnosed approximately a week before the admission. He came to the ER complaining of progressive increasing shortness of breath associated with coughing for approximately 2  weeks.  The patient was seen at [facility] few days ago and had chest x-ray which was negative and showed signs of COPD and was started yesterday on Z-Pak without much improvement, so decided to come to ED for further evaluation.  The patient was noted to be in AFib with moderate ventricular response with mild CHF. The patient received IV Lasix with improvement in his symptoms in ED.  PHYSICAL EXAMINATION:  VITAL SIGNS:  His blood pressure was 125/84, pulse 97, irregularly irregular.  He was afebrile. HEENT:  Conjunctivae were pink. NECK:  Supple.  [name] JVD. LUNGS:  Decreased breath sounds at bases with bibasilar rales. CARDIOVASCULAR:  Irregularly irregular.  He was tachycardic.  S1-S2 was soft.  There was soft systolic murmur. ABDOMEN:  Soft.  Bowel sounds were present. EXTREMITIES:  There is [name] clubbing, cyanosis, 1+ edema was noted. NEUROLOGIC:  Grossly intact.  LABORATORY DATA AND STUDIES:  His sodium was 136, potassium 4.4, BUN 14, creatinine 1.0, glucose 134, his BNP was 619,  hemoglobin  13.9, and hematocrit 41.2.  White count of 5.9.  Chest x-ray showed mild cardiomegaly,  hyperinflation with mild basilar atelectasis.  His 3 sets of troponin-I were normal.  A 2D echo was done on July 10, 2014, showed LV was moderately dilated with EF of 25% to 30% with ascending aorta moderately dilated.  Subsequently, the patient had a CT angio which showed ascending aortic aneurysm of 5.7 cm and also was noted to  have right upper lobe pulmonary nodule measuring 12 x 7 mm.  The patient subsequently underwent TEE cardioversion on July 13, 2014, and has remained in sinus rhythm.  BRIEF HOSPITAL COURSE:  CVTS consultation was obtained with Dr. Servando Snare, and patient was scheduled for outpatient PET scanning in preparation for aortic root replacement and possible lung nodule resection and question of CAD by a CT scan suggestive of calcification of left main and LAD.  The patient subsequently underwent left and right cardiac catheterization yesterday, which showed mild coronary artery disease.  The patient did not have any episodes of chest pain during the hospital stay.  His groin is stable.  The patient is ambulating in room without any problems.  The patient will be discharged home on above medications.  We will up titrate his beta blockers and ACE inhibitors as outpatient.  The patient will be scheduled for a PET scan as outpatient and will follow up with me in 1 week and CVTS as scheduled.     Allegra Lai. Terrence Dupont, M.D.     [facility]MED[facility]  D:  07/15/2014  T:  07/15/2014  Job:  [id number]

## 2014-07-22 DIAGNOSIS — I420 Dilated cardiomyopathy: Secondary | ICD-10-CM | POA: Diagnosis not present

## 2014-07-22 DIAGNOSIS — I712 Thoracic aortic aneurysm, without rupture: Secondary | ICD-10-CM | POA: Diagnosis not present

## 2014-07-22 DIAGNOSIS — R911 Solitary pulmonary nodule: Secondary | ICD-10-CM | POA: Diagnosis not present

## 2014-07-22 DIAGNOSIS — I482 Chronic atrial fibrillation: Secondary | ICD-10-CM | POA: Diagnosis not present

## 2014-07-22 DIAGNOSIS — E785 Hyperlipidemia, unspecified: Secondary | ICD-10-CM | POA: Diagnosis not present

## 2014-07-22 DIAGNOSIS — M1990 Unspecified osteoarthritis, unspecified site: Secondary | ICD-10-CM | POA: Diagnosis not present

## 2014-07-22 DIAGNOSIS — I10 Essential (primary) hypertension: Secondary | ICD-10-CM | POA: Diagnosis not present

## 2014-07-24 ENCOUNTER — Ambulatory Visit (HOSPITAL_COMMUNITY)
Admit: 2014-07-24 | Discharge: 2014-07-24 | Disposition: A | Discharge status: Home / Self Care | Payer: Medicare Other | Source: Ambulatory Visit | Attending: Cardiothoracic Surgery | Admitting: Cardiothoracic Surgery

## 2014-07-24 DIAGNOSIS — Z79899 Other long term (current) drug therapy: Secondary | ICD-10-CM | POA: Diagnosis not present

## 2014-07-24 DIAGNOSIS — R911 Solitary pulmonary nodule: Secondary | ICD-10-CM | POA: Diagnosis not present

## 2014-07-24 LAB — GLUCOSE, CAPILLARY: Glucose-Capillary: 109 mg/dL — ABNORMAL HIGH (ref 70–99)

## 2014-07-24 MED ORDER — FLUDEOXYGLUCOSE F - 18 (FDG) INJECTION
10.2000 | Freq: Once | INTRAVENOUS | Status: AC | PRN
  Administered 2014-07-24: 10.2 via INTRAVENOUS

## 2014-07-30 ENCOUNTER — Encounter: Payer: Self-pay | Admitting: Cardiothoracic Surgery

## 2014-07-30 ENCOUNTER — Other Ambulatory Visit: Payer: Self-pay | Admitting: *Deleted

## 2014-07-30 ENCOUNTER — Ambulatory Visit (INDEPENDENT_AMBULATORY_CARE_PROVIDER_SITE_OTHER): Payer: Medicare Other | Admitting: Cardiothoracic Surgery

## 2014-07-30 VITALS — BP 117/70 | HR 82 | Resp 20 | Ht 74.0 in | Wt 188.0 lb

## 2014-07-30 DIAGNOSIS — R911 Solitary pulmonary nodule: Secondary | ICD-10-CM | POA: Diagnosis not present

## 2014-07-30 DIAGNOSIS — R0602 Shortness of breath: Secondary | ICD-10-CM

## 2014-07-30 NOTE — Progress Notes (Signed)
[address]       [place] [address]             [phone]                    [facility] [place] Medical Record #[id number] Date of Birth: 04-28-1941  Referring: Charolette Forward, MD Primary Care: Clent Demark, MD  Chief Complaint:    Chief Complaint  Patient presents with  . Lung Lesion    Further discuss surgery, s/p PET Scan 07/24/14     Past Medical History  Diagnosis Date  . A-fib   . Hypertension   . Asthma   . CHF (congestive heart failure)   History of GI Bleed In 1987  Previous history of bilaterial knee replacement, cholecystectomy, inguinal hernias repairs  Past Surgical History  Procedure Laterality Date  . Tee without cardioversion N/A 07/13/2014    Procedure: TRANSESOPHAGEAL ECHOCARDIOGRAM (TEE) WITH CARDIOVERSION;  Surgeon: Dixie Dials, MD;  Location: [place];  Service: Cardiovascular;  Laterality: N/A;  . Cardioversion N/A 07/13/2014    Procedure: CARDIOVERSION;  Surgeon: Dixie Dials, MD;  Location: [facility];  Service: Cardiovascular;  Laterality: N/A;  . Left and right heart catheterization with coronary angiogram N/A 07/14/2014    Procedure: LEFT AND RIGHT HEART CATHETERIZATION WITH CORONARY ANGIOGRAM;  Surgeon: Charolette Forward, MD;  Location: [facility] CATH LAB;  Service: Cardiovascular;  Laterality: N/A;   History  Smoking status  . smoker for 4-5 years quit in 1960  Smokeless tobacco  . none   History  Alcohol Use No    History   Social History  . Marital Status: Married    Spouse Name: N/A  . Number of Children: None    Occupational History  . retired from [employer]     No Known Allergies  Current Facility-Administered Medications  Medication Dose Route Frequency Provider Last Rate Last Dose  . 0.9 % sodium chloride infusion 250 mL Intravenous PRN Dixie Dials, MD    . 0.9 % sodium chloride infusion   Intravenous Continuous Charolette Forward, MD  Stopped at 07/15/14 0436  . acetaminophen (TYLENOL) tablet 650 mg 650 mg Oral Q4H PRN Charolette Forward, MD    . albuterol (PROVENTIL) (2.5 MG/3ML) 0.083% nebulizer solution 3 mL 3 mL Inhalation Q4H PRN Charolette Forward, MD  3 mL at 07/13/14 1500  . ALPRAZolam Duanne Moron) tablet 0.25 mg 0.25 mg Oral BID Charolette Forward, MD  0.25 mg at 07/14/14 2239  . amiodarone (PACERONE) tablet 200 mg 200 mg Oral BID Charolette Forward, MD  200 mg at 07/14/14 2239  . apixaban (ELIQUIS) tablet 5 mg 5 mg Oral BID Charolette Forward, MD  5 mg at 07/14/14 2239  . atorvastatin (LIPITOR) tablet 20 mg 20 mg Oral q1800 Charolette Forward, MD  20 mg at 07/14/14 1700  . carvedilol (COREG) tablet 3.125 mg 3.125 mg Oral BID WC Dixie Dials, MD  3.125 mg at 07/14/14 1657  . lisinopril (PRINIVIL,ZESTRIL) tablet 5 mg 5 mg Oral Daily Charolette Forward, MD  5 mg at 07/14/14 1721  . mometasone-formoterol (DULERA) 100-5 MCG/ACT inhaler 2 puff 2 puff Inhalation BID Charolette Forward, MD  2 puff at 07/15/14 0742  . ondansetron (ZOFRAN) injection 4 mg 4 mg Intravenous Q6H PRN Charolette Forward, MD    . sodium chloride 0.9 % injection 3 mL 3 mL Intravenous Q12H Charolette Forward, MD  3 mL at 07/12/14 0949  . sodium chloride 0.9 % injection  3 mL 3 mL Intravenous PRN Charolette Forward, MD    . spironolactone (ALDACTONE) tablet 25 mg 25 mg Oral Daily Charolette Forward, MD  25 mg at 07/14/14 1011  . tiotropium (SPIRIVA) inhalation capsule 18 mcg 18 mcg Inhalation Daily Charolette Forward, MD  18 mcg at 07/15/14 8416    Prescriptions prior to admission  Medication Sig Dispense Refill Last Dose  . ADVAIR DISKUS 250-50 MCG/DOSE AEPB Inhale 1 puff into the lungs 2 (two) times daily.    07/09/2014 at Unknown time  . apixaban (ELIQUIS) 5 MG TABS tablet Take 5 mg by mouth 2 (two) times daily.    07/09/2014 at Unknown time  . atorvastatin (LIPITOR) 20 MG tablet Take 20 mg by mouth daily at 6 PM.    07/09/2014 at Unknown time  . azithromycin (ZITHROMAX) 250 MG tablet Take 250 mg by mouth daily.    07/09/2014 at Unknown time  . metoprolol succinate (TOPROL-XL) 50 MG 24 hr tablet Take 100 mg by mouth daily.    07/09/2014 at 0800  . PROAIR HFA 108 (90 BASE) MCG/ACT inhaler Inhale 1 puff into the lungs every 4 (four) hours as needed for wheezing or shortness of breath.    07/09/2014 at Unknown time  . SPIRIVA HANDIHALER 18 MCG inhalation capsule Place 18 mcg into inhaler and inhale daily.    07/09/2014 at Unknown time  . spironolactone (ALDACTONE) 25 MG tablet Take 25 mg by mouth daily.    07/09/2014 at Unknown time    Family History: Only child , no children, father died suddenly at home at age 68 , ? MI No family history of dissection or aortic any serum   Review of Systems:    Cardiac Review of Systems: Y or N Chest Pain [ n ] Resting SOB [ y ]Exertional SOB [ y ] Orthopnea Blue.Reese ] Pedal Edema Blue.Reese ] Palpitations Blue.Reese ]Syncope [ n ] Presyncope [ y ] General Review of Systems: [Y] = yes [ ] =no Constitional: recent weight change Vixen.Miu ]; anorexia [ ] ; fatigue Blue.Reese ]; nausea [ ] ; night sweats [ ] ; fever [n ]; or chills [ n ]  Dental: poor dentition[ y ]; Last Dentist visit: Has upper plates and lower implants  Eye : blurred vision [ n ]; diplopia [ ] ; vision changes [ ] ; Amaurosis fugax[ ] ; Resp: cough Blue.Reese ]; wheezing[ y ]; hemoptysis[ n ]; shortness of breath[ y ]; paroxysmal nocturnal dyspnea[ ] ; dyspnea on exertion[y ]; or orthopnea[y ];  GI: gallstones[ ] , vomiting[ ] ; dysphagia[ ] ; melena[ ] ; hematochezia [ ] ; heartburn[ ] ; Hx of Colonoscopy[y ]; GU: kidney  stones [ ] ; hematuria[ n ]; dysuria [ ] ; nocturia[ ] ; history of obstruction [ ] ; urinary frequency [ n ]  Skin: rash, swelling[ ] ;, hair loss[ ] ; peripheral edema[ ] ; or itching[ ] ; Musculosketetal: myalgias[ ] ; joint swelling[ y ]; joint erythema[ y ]; joint pain[ ] ; back pain[ ] ; Heme/Lymph: bruising[ ] ; bleeding[ ] ; anemia[ ] ;  Neuro: TIA[ ] ; headaches[ ] ; stroke[ ] ; vertigo[ ] ; seizures[ n ]; paresthesias[ ] ; difficulty walking[ n ]; Psych:depression[ ] ; anxiety[ ] ; Endocrine: diabetes[n ]; thyroid dysfunction[ n ]; Immunizations: Flu [n ]; Pneumococcal[ n]; Other:  Physical Exam: BP 117/70 mmHg  Pulse 82  Resp 20  Ht 6\' 2"  (1.88 m)  Wt 188 lb (85.276 kg)  BMI 24.13 kg/m2  SpO2 91%   General appearance: alert, cooperative, appears older than stated age and no distress Head: Normocephalic, without obvious abnormality, atraumatic Neck: no adenopathy, no  carotid bruit, no JVD, supple, symmetrical, trachea midline and thyroid not enlarged, symmetric, no tenderness/mass/nodules Lymph nodes: Cervical, supraclavicular, and axillary nodes normal. Resp: clear to auscultation bilaterally Back: symmetric, no curvature. ROM normal. No CVA tenderness. Cardio: now regular Rhythm, ii/vi m of AI GI: soft, non-tender; bowel sounds normal; no masses, no organomegaly Extremities: extremities normal, atraumatic, no cyanosis or edema and Homans sign is negative, no sign of DVT Neurologic: Grossly normal No carotid bruits Palpable dp and pt pulses, bilateral bilateral knee incisions from knee replacement   Diagnostic Studies & Laboratory data:   Recent Radiology Findings:     Nm Pet Image Initial (pi) Skull Base To Thigh  07/24/2014   CLINICAL DATA:  Initial treatment strategy for right upper lobe pulmonary nodule.  EXAM: NUCLEAR MEDICINE PET SKULL BASE TO THIGH   TECHNIQUE: 10.2 mCi F-18 FDG was injected intravenously. Full-ring PET imaging was performed from the skull base to thigh after the radiotracer. CT data was obtained and used for attenuation correction and anatomic localization.  FASTING BLOOD GLUCOSE:  Value: 109 mg/dl  COMPARISON:  CTA of the chest of 07/10/2014.  FINDINGS: NECK  No areas of abnormal hypermetabolism.  CHEST  No hypermetabolism within the right upper lobe. The nodule detailed on the prior exam likely represented branching vessels. Example image 32 of series 6 today. There is equivocal soft tissue fullness in this area on image 72 of series 501 of the prior CT. No thoracic nodal hypermetabolism.  ABDOMEN/PELVIS  No areas of abnormal hypermetabolism.  SKELETON  Hypermetabolism involving a posterior right-sided cervical facet. Likely degenerative.  CT IMAGES PERFORMED FOR ATTENUATION CORRECTION  No cervical adenopathy.  Chest, abdomen, and pelvic findings deferred to recent diagnostic CT. No acute superimposed process. Cholecystectomy. Old granulomatous disease in the spleen. Prostatomegaly. Degenerative partial fusion of the bilateral sacroiliac joints.  IMPRESSION: No right upper lobe hypermetabolism to correspond to the right upper lobe Nodule detailed on the prior exam. This is favored to represent a branching vessel, when correlated with today's CT. As there is equivocal soft tissue fullness in this area on thin slice imaging of the prior diagnostic CTA, followup with chest CT at 6 months should be considered.   Electronically Signed   By: Abigail Miyamoto M.D.   On: 07/24/2014 15:57   Dg Chest Portable 1 View  07/10/2014   CLINICAL DATA:  Shortness of breath and chest discomfort.  EXAM: PORTABLE CHEST - 1 VIEW  COMPARISON:  Remote frontal and lateral views 10/23/2007  FINDINGS: The heart is enlarged. Mild prominence of central pulmonary vasculature without pulmonary edema. Lungs remain hyperinflated. No consolidation, pleural effusion, or  pneumothorax. Minimal atelectasis or scarring at the left lung base.  IMPRESSION: 1. Mild cardiomegaly. 2. Hyperinflation with left basilar atelectasis.   Electronically Signed   By: Jeb Levering M.D.   On: 07/10/2014 02:25   Ct Angio Abd/pel W/ And/or W/o  07/10/2014   CLINICAL DATA:  74 year old male with increasing shortness of breath for the past 2 weeks, cough, chest tightness and shortness of breath, which worsens when lying flat. History of atrial fibrillation. History of thoracic aortic aneurysm.  EXAM: CT ANGIOGRAPHY CHEST, ABDOMEN AND PELVIS  TECHNIQUE: Multidetector CT imaging through the chest, abdomen and pelvis was performed using the standard protocol during bolus administration of intravenous contrast. Multiplanar reconstructed images and MIPs were obtained and reviewed to evaluate the vascular anatomy.  CONTRAST:  116mL OMNIPAQUE IOHEXOL 350 MG/ML SOLN  COMPARISON:  None.  FINDINGS: CTA CHEST  FINDINGS  Mediastinum/Lymph Nodes: Aneurysmal dilatation of the aortic root which measures 3.2 cm in diameter at the annulus, but 5.7 cm in diameter at the level of the sinuses of Valsalva. The sino-tubular junction is effaced. The thoracic aortic arch is normal in caliber at 2.8 cm. The isthmus of the thoracic aorta is mildly ectatic measuring 4 cm in diameter. The descending thoracic aorta is normal in caliber at 3 cm in diameter. No evidence of thoracic aortic dissection. Heart size is mildly enlarged. There is no significant pericardial fluid, thickening or pericardial calcification. There is atherosclerosis of the thoracic aorta, the great vessels of the mediastinum and the coronary arteries, including calcified atherosclerotic plaque in the left main and left anterior descending coronary arteries. Multiple borderline enlarged and mildly enlarged mediastinal and right hilar lymph nodes, largest of which is in the low right paratracheal nodal station measuring 14 mm in short axis. Multiple densely  calcified right hilar and mediastinal lymph nodes, presumably from old granulomatous disease. Esophagus is normal in appearance. Multiple borderline enlarged and mildly enlarged left axillary lymph nodes measuring up to 10 mm in short axis.  Lungs/Pleura: Large calcified granuloma in the posterior aspect of the right lower lobe with adjacent scarring. 12 x 7 mm right upper lobe pulmonary nodule (image 29 of series 506). No acute consolidative airspace disease. No pleural effusions. Diffuse subpleural bullae are noted in the apices of the lungs bilaterally. Small left-sided Bochdalek's hernia.  Musculoskeletal/Soft Tissues: There are no aggressive appearing lytic or blastic lesions noted in the visualized portions of the skeleton.  Review of the MIP images confirms the above findings.  CTA ABDOMEN AND PELVIS FINDINGS  Hepatobiliary: Status post cholecystectomy. 8 mm low attenuation lesion in segment 6 of the liver (image 180 of series 501), too small to characterize, but statistically likely a tiny cyst. No other suspicious appearing hepatic lesions. No intra or extrahepatic biliary ductal dilatation.  Pancreas: Unremarkable.  Spleen: Multiple calcifications in the spleen, and irregular splenic contour, which could relate to remote trauma or prior splenic infarcts.  Adrenals/Urinary Tract: Bilateral kidneys and bilateral adrenal glands are normal in appearance. No hydroureteronephrosis. Urinary bladder is normal in appearance.  Stomach/Bowel: Who normal appearance of the stomach. No pathologic dilatation of small bowel or colon. Normal appendix.  Vascular/Lymphatic: Atherosclerosis throughout the abdominal and pelvic vasculature. Aneurysmal dilatation of the common iliac arteries bilaterally which measure up to 1.6 cm in diameter bilaterally. No evidence of dissection. No lymphadenopathy noted in the abdomen or pelvis.  Reproductive: Prostate gland is enlarged and heterogeneous in appearance measuring 6.3 x 4.1 cm.  Seminal vesicles are unremarkable in appearance.  Other: No significant volume of ascites.  No pneumoperitoneum.  Musculoskeletal: There are no aggressive appearing lytic or blastic lesions noted in the visualized portions of the skeleton.  Review of the MIP images confirms the above findings.  IMPRESSION: 1. Aneurysmal dilatation of the aortic root, which measures up to 5.7 cm in diameter at the level of the sinuses of Valsalva. There is effacement of the sino-tubular junction. This is commonly seen in the setting of Marfan syndrome. No evidence of aortic dissection at this time. There is also some ectasia at of the isthmus of the thoracic aorta which measures up to 4 cm in diameter. 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 2. 12 x 7 mm right upper lobe pulmonary nodule (image 29 of series 506). Followup evaluation  with PET-CT is recommended in the near future to evaluate for potential malignancy. 3. Left main and left anterior descending coronary artery disease. Assessment for potential risk factor modification, dietary therapy or pharmacologic therapy may be warranted, if clinically indicated. 4. Mild cardiomegaly. 54. Sequela of old granulomatous disease, as above. Multiple borderline enlarged and mildly enlarged mediastinal and right hilar lymph nodes that are noncalcified are also noted, as well as borderline enlarged and minimally enlarged left axillary lymph nodes. These are nonspecific, but clinical correlation to exclude the possibility of underlying lymphoproliferative disorder may be appropriate. 6. Additional incidental findings, as above.   Electronically Signed   By: Vinnie Langton M.D.   On: 07/10/2014 21:47   Ct Angio Chest Pe W/cm &/or Wo Cm  07/10/2014 CLINICAL DATA: 75 year old male with increasing shortness of breath for the past 2 weeks, cough, chest tightness and shortness of breath, which worsens  when lying flat. History of atrial fibrillation. History of thoracic aortic aneurysm. EXAM: CT ANGIOGRAPHY CHEST, ABDOMEN AND PELVIS TECHNIQUE: Multidetector CT imaging through the chest, abdomen and pelvis was performed using the standard protocol during bolus administration of intravenous contrast. Multiplanar reconstructed images and MIPs were obtained and reviewed to evaluate the vascular anatomy. CONTRAST: 185mL OMNIPAQUE IOHEXOL 350 MG/ML SOLN COMPARISON: None. FINDINGS: CTA CHEST FINDINGS Mediastinum/Lymph Nodes: Aneurysmal dilatation of the aortic root which measures 3.2 cm in diameter at the annulus, but 5.7 cm in diameter at the level of the sinuses of Valsalva. The sino-tubular junction is effaced. The thoracic aortic arch is normal in caliber at 2.8 cm. The isthmus of the thoracic aorta is mildly ectatic measuring 4 cm in diameter. The descending thoracic aorta is normal in caliber at 3 cm in diameter. No evidence of thoracic aortic dissection. Heart size is mildly enlarged. There is no significant pericardial fluid, thickening or pericardial calcification. There is atherosclerosis of the thoracic aorta, the great vessels of the mediastinum and the coronary arteries, including calcified atherosclerotic plaque in the left main and left anterior descending coronary arteries. Multiple borderline enlarged and mildly enlarged mediastinal and right hilar lymph nodes, largest of which is in the low right paratracheal nodal station measuring 14 mm in short axis. Multiple densely calcified right hilar and mediastinal lymph nodes, presumably from old granulomatous disease. Esophagus is normal in appearance. Multiple borderline enlarged and mildly enlarged left axillary lymph nodes measuring up to 10 mm in short axis. Lungs/Pleura: Large calcified granuloma in the posterior aspect of the right lower lobe with adjacent scarring. 12 x 7 mm right upper lobe pulmonary nodule (image 29 of series 506). No acute  consolidative airspace disease. No pleural effusions. Diffuse subpleural bullae are noted in the apices of the lungs bilaterally. Small left-sided Bochdalek's hernia. Musculoskeletal/Soft Tissues: There are no aggressive appearing lytic or blastic lesions noted in the visualized portions of the skeleton. Review of the MIP images confirms the above findings. CTA ABDOMEN AND PELVIS FINDINGS Hepatobiliary: Status post cholecystectomy. 8 mm low attenuation lesion in segment 6 of the liver (image 180 of series 501), too small to characterize, but statistically likely a tiny cyst. No other suspicious appearing hepatic lesions. No intra or extrahepatic biliary ductal dilatation. Pancreas: Unremarkable. Spleen: Multiple calcifications in the spleen, and irregular splenic contour, which could relate to remote trauma or prior splenic infarcts. Adrenals/Urinary Tract: Bilateral kidneys and bilateral adrenal glands are normal in appearance. No hydroureteronephrosis. Urinary bladder is normal in appearance. Stomach/Bowel: Who normal appearance of the stomach. No pathologic dilatation of small  bowel or colon. Normal appendix. Vascular/Lymphatic: Atherosclerosis throughout the abdominal and pelvic vasculature. Aneurysmal dilatation of the common iliac arteries bilaterally which measure up to 1.6 cm in diameter bilaterally. No evidence of dissection. No lymphadenopathy noted in the abdomen or pelvis. Reproductive: Prostate gland is enlarged and heterogeneous in appearance measuring 6.3 x 4.1 cm. Seminal vesicles are unremarkable in appearance. Other: No significant volume of ascites. No pneumoperitoneum. Musculoskeletal: There are no aggressive appearing lytic or blastic lesions noted in the visualized portions of the skeleton. Review of the MIP images confirms the above findings. IMPRESSION: 1. Aneurysmal dilatation of the aortic root, which measures up to 5.7 cm in diameter at the level of the sinuses of Valsalva.  There is effacement of the sino-tubular junction. This is commonly seen in the setting of Marfan syndrome. No evidence of aortic dissection at this time. There is also some ectasia at of the isthmus of the thoracic aorta which measures up to 4 cm in diameter. 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 2. 12 x 7 mm right upper lobe pulmonary nodule (image 29 of series 506). Followup evaluation with PET-CT is recommended in the near future to evaluate for potential malignancy. 3. Left main and left anterior descending coronary artery disease. Assessment for potential risk factor modification, dietary therapy or pharmacologic therapy may be warranted, if clinically indicated. 4. Mild cardiomegaly. 2. Sequela of old granulomatous disease, as above. Multiple borderline enlarged and mildly enlarged mediastinal and right hilar lymph nodes that are noncalcified are also noted, as well as borderline enlarged and minimally enlarged left axillary lymph nodes. These are nonspecific, but clinical correlation to exclude the possibility of underlying lymphoproliferative disorder may be appropriate. 6. Additional incidental findings, as above. Electronically Signed By: Vinnie Langton M.D. On: 07/10/2014 21:47   Dg Chest Portable 1 View  07/10/2014 CLINICAL DATA: Shortness of breath and chest discomfort. EXAM: PORTABLE CHEST - 1 VIEW COMPARISON: Remote frontal and lateral views 10/23/2007 FINDINGS: The heart is enlarged. Mild prominence of central pulmonary vasculature without pulmonary edema. Lungs remain hyperinflated. No consolidation, pleural effusion, or pneumothorax. Minimal atelectasis or scarring at the left lung base. IMPRESSION: 1. Mild cardiomegaly. 2. Hyperinflation with left basilar atelectasis. Electronically Signed By: Jeb Levering M.D. On: 07/10/2014 02:25    I have independently reviewed the above  radiologic studies.  Cardiac Cath:FINDINGS: LV showed LV was moderately enlarged. There was global hypokinesia. EF of approximately 20% to 25%. Left main was very short, which was patent. LAD was patent. Diagonal 1 and 2 were patent. Left circumflex was patent. OM-1 was small, which was patent. OM-2 was moderate sized, which was patent. RCA was patent. PDA and PLV branches were patent.  The patient tolerated the procedure well. There were no complications. The patient was transferred to recovery room in stable condition  PLAN: To maximize beta-blockers and ACE inhibitors. We will start Eliquis tomorrow and possibly discharge tomorrow. The patient will be scheduled for PET scan as an outpatient and open heart surgery for aortic root replacement, possibly aortic valve as an outpatient.   Allegra Lai. Terrence Dupont, M.D.  I have independently reviewed the above cath films and reviewed the findings with the patient .   Recent Lab Findings:  Recent Labs    Lab Results  Component Value Date   WBC 4.8 07/15/2014   HGB 14.4 07/15/2014   HCT 43.4 07/15/2014   PLT 168 07/15/2014   GLUCOSE  113* 07/13/2014   ALT 149* 07/10/2014   AST 79* 07/10/2014   NA 138 07/13/2014   K 4.0 07/13/2014   CL 106 07/13/2014   CREATININE 0.88 07/13/2014   BUN 15 07/13/2014   CO2 24 07/13/2014   INR 1.47 07/14/2014     RecentCardiac CATH:  FINDINGS: LV showed LV was moderately enlarged. There was global hypokinesia. EF of approximately 20% to 25%. Left main was very short, which was patent. LAD was patent. Diagonal 1 and 2 were patent. Left circumflex was patent. OM-1 was small, which was patent. OM-2 was moderate sized, which was patent. RCA was patent. PDA and PLV branches were patent.     TTE: EF 25-30 %, LV ID, ED, PLAX chordal     (H)   67.9 mm   Echocardiography  Patient:  Orson, Rho MR #:    [id number] Study Date: 07/10/2014 Gender:   M Age:    25 Height:   188 cm Weight:   90.3 kg BSA:    2.18 m^2 Pt. Status: Room:    3E02C  ADMITTING  Charolette Forward, MD ATTENDING  Charolette Forward, MD ORDERING   Charolette Forward, MD PERFORMING  Charolette Forward, MD REFERRING  Charolette Forward, MD SONOGRAPHER Delman Kitten, RCS  cc:  ------------------------------------------------------------------- LV EF: 25% -  30%  ------------------------------------------------------------------- Indications:   Atrial fibrillation - 427.31. CHF - 428.0.  ------------------------------------------------------------------- History:  PMH:  Chronic obstructive pulmonary disease. Risk factors: Dyslipidemia.  ------------------------------------------------------------------- Study Conclusions  - Left ventricle: The cavity size was moderately dilated. Systolic function was severely reduced. The estimated ejection fraction was in the range of 25% to 30%. Diffuse hypokinesis. - Aortic valve: There was mild regurgitation. - Left atrium: The atrium was mildly dilated. - Atrial septum: No defect or patent foramen ovale was identified. - Pulmonary arteries: Systolic pressure was mildly to moderately increased. PA peak pressure: 32 mm Hg (S).  Echocardiography. M-mode, complete 2D, spectral Doppler, and color Doppler. Birthdate: Patient birthdate: [birth year]. Age: Patient is 74 yr old. Sex: Gender: male.  BMI: 25.6 kg/m^2. Blood pressure:   128/78 Patient status: Inpatient. Study date: Study date: 07/10/2014. Study time: 01:10 PM. Location: Echo laboratory.  -------------------------------------------------------------------  ------------------------------------------------------------------- Left ventricle: The cavity size was moderately dilated. Systolic function was severely reduced. The estimated ejection fraction  was in the range of 25% to 30%. Diffuse hypokinesis.  ------------------------------------------------------------------- Aortic valve:  Trileaflet; mildly thickened leaflets. Doppler: There was mild regurgitation.  ------------------------------------------------------------------- Aorta: The aorta was moderately dilated. There was no evidence for dissection. Ascending aorta: The ascending aorta was normal in size.  ------------------------------------------------------------------- Mitral valve:  Structurally normal valve.  Leaflet separation was normal. Doppler: Transvalvular velocity was within the normal range. There was no evidence for stenosis. There was trivial regurgitation.  ------------------------------------------------------------------- Left atrium: The atrium was mildly dilated.  ------------------------------------------------------------------- Atrial septum: No defect or patent foramen ovale was identified.  ------------------------------------------------------------------- Right ventricle: The cavity size was normal. Wall thickness was normal. Systolic function was normal.  ------------------------------------------------------------------- Pulmonic valve:  Structurally normal valve.  Cusp separation was normal. Doppler: Transvalvular velocity was within the normal range. There was no regurgitation.  ------------------------------------------------------------------- Tricuspid valve:  Structurally normal valve.  Leaflet separation was normal. Doppler: Transvalvular velocity was within the normal range. There was mild regurgitation.  ------------------------------------------------------------------- Pulmonary artery:  Systolic pressure was mildly to moderately increased.  ------------------------------------------------------------------- Right atrium: The atrium was normal in  size.  ------------------------------------------------------------------- Pericardium: There was no pericardial effusion.  ------------------------------------------------------------------- Post procedure conclusions  Ascending Aorta:  - The aorta was moderately dilated.  ------------------------------------------------------------------- Measurements  Left ventricle              Value    Reference LV ID, ED, PLAX chordal     (H)   67.9 mm   43 - 52 LV ID, ES, PLAX chordal     (H)   61.9 mm   23 - 38 LV fx shortening, PLAX chordal  (L)   9   %   >=29 LV PW thickness, ED           11.1 mm   --------- IVS/LV PW ratio, ED           0.98     <=1.3  Ventricular septum            Value    Reference IVS thickness, ED            10.9 mm   ---------  LVOT                   Value    Reference LVOT ID, S                26  mm   --------- LVOT area                5.31 cm^2  ---------  Aortic valve               Value    Reference Aortic regurg pressure half-time     295  ms   ---------  Aorta                  Value    Reference Aortic root ID, ED            48  mm   ---------  Left atrium               Value    Reference LA ID, A-P, ES              47  mm   --------- LA ID/bsa, A-P              2.16 cm/m^2 <=2.2 LA volume, ES, 1-p A4C          56.5 ml   --------- LA volume/bsa, ES, 1-p A4C        25.9 ml/m^2 --------- LA volume, ES, 1-p A2C          96.7 ml   --------- LA volume/bsa, ES, 1-p A2C        44.4 ml/m^2 ---------  Pulmonary arteries            Value    Reference PA pressure, S, DP        (H)   32  mm Hg  <=30  Tricuspid valve             Value    Reference Tricuspid regurg peak velocity      244  cm/s  --------- Tricuspid peak RV-RA gradient      24  mm Hg ---------  Systemic veins              Value    Reference Estimated CVP              8   mm Hg ---------  Right ventricle             Value    Reference RV pressure, S, DP        (  H)   32  mm Hg <=30  Legend: (L) and (H) mark values outside specified reference range.  ------------------------------------------------------------------- Prepared and Electronically Authenticated by  Charolette Forward, MD 2016-03-11T17:23:24  PFT's 07/12/2014 21.5 57% DLCO 25.45 67% I does get Response to bronchodilator Airtrapping Mild Diffusion Defect  Aortic Size Index=         /Body surface area is 2.11 meters squared. = 2.7  < 2.75 cm/m2      4% risk per year 2.75 to 4.25          8% risk per year > 4.25 cm/m2    20% risk per year   Assessment / Plan:  1/ Aneurysmal dilatation of the aortic root, which measures up to 5.4- 5.7 cm in diameter at the level of the sinuses of Valsalva. No evidence of aortic dissection at this time. 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  5/Left main and left anterior descending coronary  artery calcification on CT but without significant CAD at cath  6/ AFib- now in sinus on Elliquis  7/ COPD- Moderately severe Obstructive Airways Disease, Response to bronchodilator, Airtrapping, Mild Diffusion Defect  Recommend:   With depressed lv function out of proportion to CAD or degree of AI suspect af/tachycaria mediated lv dysfunction, holding sinus and aggressive treatment of heart failure before proceeding with root replacement would be of benefit. I discussed findings of PET with patient . Now with treatment for HF and controlled  Rate for several weeks will reevaluate lv function with echo , see patient back in 2-3 weeks and tentatively schedule Aortic root replacement and poss MAZE  I  spent 20 minutes counseling the patient face to face and 50% or more the  time was spent in counseling and coordination of care. The total time spent in the appointment was 30 minutes.      Grace Isaac MD      [place] [place], [address] Office [phone]   Beeper [phone]  07/30/2014 2:23 PM

## 2014-08-07 ENCOUNTER — Other Ambulatory Visit (HOSPITAL_COMMUNITY): Payer: Self-pay | Admitting: Cardiothoracic Surgery

## 2014-08-07 ENCOUNTER — Ambulatory Visit (HOSPITAL_COMMUNITY)
Admission: RE | Admit: 2014-08-07 | Discharge: 2014-08-07 | Disposition: A | Discharge status: Home / Self Care | Payer: Medicare Other | Source: Ambulatory Visit | Attending: Cardiothoracic Surgery | Admitting: Cardiothoracic Surgery

## 2014-08-07 DIAGNOSIS — R0602 Shortness of breath: Secondary | ICD-10-CM | POA: Diagnosis not present

## 2014-08-07 DIAGNOSIS — I509 Heart failure, unspecified: Secondary | ICD-10-CM | POA: Diagnosis not present

## 2014-08-07 NOTE — Progress Notes (Signed)
Echocardiogram 2D Echocardiogram has been performed.  Ivon Roedel 08/07/2014, 12:12 PM

## 2014-08-21 ENCOUNTER — Ambulatory Visit: Payer: Medicare Other | Admitting: Cardiothoracic Surgery

## 2014-08-26 ENCOUNTER — Ambulatory Visit (INDEPENDENT_AMBULATORY_CARE_PROVIDER_SITE_OTHER): Payer: Medicare Other | Admitting: Cardiothoracic Surgery

## 2014-08-26 ENCOUNTER — Encounter: Payer: Self-pay | Admitting: Cardiothoracic Surgery

## 2014-08-26 VITALS — BP 111/64 | HR 60 | Resp 20 | Ht 74.0 in | Wt 178.0 lb

## 2014-08-26 DIAGNOSIS — I351 Nonrheumatic aortic (valve) insufficiency: Secondary | ICD-10-CM

## 2014-08-26 DIAGNOSIS — I2510 Atherosclerotic heart disease of native coronary artery without angina pectoris: Secondary | ICD-10-CM | POA: Diagnosis not present

## 2014-08-26 NOTE — Progress Notes (Signed)
[name]       [place] [address]             [phone]                    [name] [facility] Medical Record #[id number] Date of Birth: 22-Sep-1940  Referring: Charolette Forward, MD Primary Care: Clent Demark, MD  Chief Complaint:    Chief Complaint  Patient presents with  . Coronary Artery Disease    review echo, schedule surgery   . Aortic Insuffiency     Past Medical History  Diagnosis Date  . A-fib   . Hypertension   . Asthma   . CHF (congestive heart failure)   History of GI Bleed In 1987  Previous history of bilaterial knee replacement, cholecystectomy, inguinal hernias repairs  Past Surgical History  Procedure Laterality Date  . Tee without cardioversion N/A 07/13/2014    Procedure: TRANSESOPHAGEAL ECHOCARDIOGRAM (TEE) WITH CARDIOVERSION;  Surgeon: Dixie Dials, MD;  Location: [facility];  Service: Cardiovascular;  Laterality: N/A;  . Cardioversion N/A 07/13/2014    Procedure: CARDIOVERSION;  Surgeon: Dixie Dials, MD;  Location: [facility];  Service: Cardiovascular;  Laterality: N/A;  . Left and right heart catheterization with coronary angiogram N/A 07/14/2014    Procedure: LEFT AND RIGHT HEART CATHETERIZATION WITH CORONARY ANGIOGRAM;  Surgeon: Charolette Forward, MD;  Location: [facility] CATH LAB;  Service: Cardiovascular;  Laterality: N/A;   History  Smoking status  . smoker for 4-5 years quit in 1960  Smokeless tobacco  . none   History  Alcohol Use No    History   Social History  . Marital Status: Married    Spouse Name: N/A  . Number of Children: None    Occupational History  . retired from [employer]     No Known Allergies  Current Facility-Administered Medications  Medication Dose Route Frequency Provider Last Rate Last Dose  . 0.9 % sodium chloride infusion 250 mL Intravenous PRN Dixie Dials, MD    . 0.9 % sodium  chloride infusion  Intravenous Continuous Charolette Forward, MD  Stopped at 07/15/14 0436  . acetaminophen (TYLENOL) tablet 650 mg 650 mg Oral Q4H PRN Charolette Forward, MD    . albuterol (PROVENTIL) (2.5 MG/3ML) 0.083% nebulizer solution 3 mL 3 mL Inhalation Q4H PRN Charolette Forward, MD  3 mL at 07/13/14 1500  . ALPRAZolam Duanne Moron) tablet 0.25 mg 0.25 mg Oral BID Charolette Forward, MD  0.25 mg at 07/14/14 2239  . amiodarone (PACERONE) tablet 200 mg 200 mg Oral BID Charolette Forward, MD  200 mg at 07/14/14 2239  . apixaban (ELIQUIS) tablet 5 mg 5 mg Oral BID Charolette Forward, MD  5 mg at 07/14/14 2239  . atorvastatin (LIPITOR) tablet 20 mg 20 mg Oral q1800 Charolette Forward, MD  20 mg at 07/14/14 1700  . carvedilol (COREG) tablet 3.125 mg 3.125 mg Oral BID WC Dixie Dials, MD  3.125 mg at 07/14/14 1657  . lisinopril (PRINIVIL,ZESTRIL) tablet 5 mg 5 mg Oral Daily Charolette Forward, MD  5 mg at 07/14/14 1721  . mometasone-formoterol (DULERA) 100-5 MCG/ACT inhaler 2 puff 2 puff Inhalation BID Charolette Forward, MD  2 puff at 07/15/14 0742  . ondansetron (ZOFRAN) injection 4 mg 4 mg Intravenous Q6H PRN Charolette Forward, MD    . sodium chloride 0.9 % injection 3 mL 3 mL Intravenous Q12H Charolette Forward, MD  3 mL at 07/12/14 0949  . sodium chloride  0.9 % injection 3 mL 3 mL Intravenous PRN Charolette Forward, MD    . spironolactone (ALDACTONE) tablet 25 mg 25 mg Oral Daily Charolette Forward, MD  25 mg at 07/14/14 1011  . tiotropium (SPIRIVA) inhalation capsule 18 mcg 18 mcg Inhalation Daily Charolette Forward, MD  18 mcg at 07/15/14 1740    Prescriptions prior to admission  Medication Sig Dispense Refill Last Dose  . ADVAIR DISKUS 250-50 MCG/DOSE AEPB Inhale 1 puff into the lungs 2 (two) times daily.    07/09/2014 at Unknown time  . apixaban (ELIQUIS) 5 MG TABS tablet Take 5 mg by mouth 2  (two) times daily.   07/09/2014 at Unknown time  . atorvastatin (LIPITOR) 20 MG tablet Take 20 mg by mouth daily at 6 PM.    07/09/2014 at Unknown time  . azithromycin (ZITHROMAX) 250 MG tablet Take 250 mg by mouth daily.    07/09/2014 at Unknown time  . metoprolol succinate (TOPROL-XL) 50 MG 24 hr tablet Take 100 mg by mouth daily.    07/09/2014 at 0800  . PROAIR HFA 108 (90 BASE) MCG/ACT inhaler Inhale 1 puff into the lungs every 4 (four) hours as needed for wheezing or shortness of breath.    07/09/2014 at Unknown time  . SPIRIVA HANDIHALER 18 MCG inhalation capsule Place 18 mcg into inhaler and inhale daily.    07/09/2014 at Unknown time  . spironolactone (ALDACTONE) 25 MG tablet Take 25 mg by mouth daily.    07/09/2014 at Unknown time    Family History: Only child , no children, father died suddenly at home at age 29 , ? MI No family history of dissection or aortic any serum   Review of Systems:    Cardiac Review of Systems: Y or N Chest Pain [ n ] Resting SOB [ y ]Exertional SOB [ y ] Orthopnea Blue.Reese ] Pedal Edema Blue.Reese ] Palpitations Blue.Reese ]Syncope [ n ] Presyncope [ y ] General Review of Systems: [Y] = yes [ ] =no Constitional: recent weight change Vixen.Miu ]; anorexia [ ] ; fatigue Blue.Reese ]; nausea [ ] ; night sweats [ ] ; fever [n ]; or chills [ n ]  Dental: poor dentition[ y ]; Last Dentist visit: Has upper plates and lower implants  Eye : blurred vision [ n ]; diplopia [ ] ; vision changes [ ] ; Amaurosis fugax[ ] ; Resp: cough Blue.Reese ]; wheezing[ y ]; hemoptysis[ n ]; shortness of breath[ y ]; paroxysmal nocturnal dyspnea[ ] ; dyspnea on exertion[y ]; or orthopnea[y ];  GI: gallstones[ ] , vomiting[ ] ; dysphagia[ ] ; melena[ ] ; hematochezia [ ] ; heartburn[ ] ; Hx of  Colonoscopy[y ]; GU: kidney stones [ ] ; hematuria[ n ]; dysuria [ ] ; nocturia[ ] ; history of obstruction [ ] ; urinary frequency [ n ]  Skin: rash, swelling[ ] ;, hair loss[ ] ; peripheral edema[ ] ; or itching[ ] ; Musculosketetal: myalgias[ ] ; joint swelling[ y ]; joint erythema[ y ]; joint pain[ ] ; back pain[ ] ; Heme/Lymph: bruising[ ] ; bleeding[ ] ; anemia[ ] ;  Neuro: TIA[ ] ; headaches[ ] ; stroke[ ] ; vertigo[ ] ; seizures[ n ]; paresthesias[ ] ; difficulty walking[ n ]; Psych:depression[ ] ; anxiety[ ] ; Endocrine: diabetes[n ]; thyroid dysfunction[ n ]; Immunizations: Flu [n ]; Pneumococcal[ n]; Other:  Physical Exam: BP 111/64 mmHg  Pulse 60  Resp 20  Ht 6\' 2"  (1.88 m)  Wt 178 lb (80.74 kg)  BMI 22.84 kg/m2  SpO2 96%   General appearance: alert, cooperative, appears older than stated age and no distress Head: Normocephalic, without obvious abnormality, atraumatic Neck:  no adenopathy, no carotid bruit, no JVD, supple, symmetrical, trachea midline and thyroid not enlarged, symmetric, no tenderness/mass/nodules Lymph nodes: Cervical, supraclavicular, and axillary nodes normal. Resp: clear to auscultation bilaterally Back: symmetric, no curvature. ROM normal. No CVA tenderness. Cardio: now regular Rhythm, ii/vi m of AI GI: soft, non-tender; bowel sounds normal; no masses, no organomegaly Extremities: extremities normal, atraumatic, no cyanosis or edema and Homans sign is negative, no sign of DVT Neurologic: Grossly normal No carotid bruits Palpable dp and pt pulses, bilateral bilateral knee incisions from knee replacement   Diagnostic Studies & Laboratory data:   Recent Radiology Findings:     Nm Pet Image Initial (pi) Skull Base To Thigh  07/24/2014   CLINICAL DATA:  Initial treatment strategy for right upper lobe pulmonary nodule.  EXAM: NUCLEAR MEDICINE PET  SKULL BASE TO THIGH  TECHNIQUE: 10.2 mCi F-18 FDG was injected intravenously. Full-ring PET imaging was performed from the skull base to thigh after the radiotracer. CT data was obtained and used for attenuation correction and anatomic localization.  FASTING BLOOD GLUCOSE:  Value: 109 mg/dl  COMPARISON:  CTA of the chest of 07/10/2014.  FINDINGS: NECK  No areas of abnormal hypermetabolism.  CHEST  No hypermetabolism within the right upper lobe. The nodule detailed on the prior exam likely represented branching vessels. Example image 32 of series 6 today. There is equivocal soft tissue fullness in this area on image 72 of series 501 of the prior CT. No thoracic nodal hypermetabolism.  ABDOMEN/PELVIS  No areas of abnormal hypermetabolism.  SKELETON  Hypermetabolism involving a posterior right-sided cervical facet. Likely degenerative.  CT IMAGES PERFORMED FOR ATTENUATION CORRECTION  No cervical adenopathy.  Chest, abdomen, and pelvic findings deferred to recent diagnostic CT. No acute superimposed process. Cholecystectomy. Old granulomatous disease in the spleen. Prostatomegaly. Degenerative partial fusion of the bilateral sacroiliac joints.  IMPRESSION: No right upper lobe hypermetabolism to correspond to the right upper lobe Nodule detailed on the prior exam. This is favored to represent a branching vessel, when correlated with today's CT. As there is equivocal soft tissue fullness in this area on thin slice imaging of the prior diagnostic CTA, followup with chest CT at 6 months should be considered.   Electronically Signed   By: Abigail Miyamoto M.D.   On: 07/24/2014 15:57   Dg Chest Portable 1 View  07/10/2014   CLINICAL DATA:  Shortness of breath and chest discomfort.  EXAM: PORTABLE CHEST - 1 VIEW  COMPARISON:  Remote frontal and lateral views 10/23/2007  FINDINGS: The heart is enlarged. Mild prominence of central pulmonary vasculature without pulmonary edema. Lungs remain hyperinflated. No consolidation, pleural  effusion, or pneumothorax. Minimal atelectasis or scarring at the left lung base.  IMPRESSION: 1. Mild cardiomegaly. 2. Hyperinflation with left basilar atelectasis.   Electronically Signed   By: Jeb Levering M.D.   On: 07/10/2014 02:25   Ct Angio Abd/pel W/ And/or W/o  07/10/2014   CLINICAL DATA:  74 year old male with increasing shortness of breath for the past 2 weeks, cough, chest tightness and shortness of breath, which worsens when lying flat. History of atrial fibrillation. History of thoracic aortic aneurysm.  EXAM: CT ANGIOGRAPHY CHEST, ABDOMEN AND PELVIS  TECHNIQUE: Multidetector CT imaging through the chest, abdomen and pelvis was performed using the standard protocol during bolus administration of intravenous contrast. Multiplanar reconstructed images and MIPs were obtained and reviewed to evaluate the vascular anatomy.  CONTRAST:  172mL OMNIPAQUE IOHEXOL 350 MG/ML SOLN  COMPARISON:  None.  FINDINGS: CTA CHEST FINDINGS  Mediastinum/Lymph Nodes: Aneurysmal dilatation of the aortic root which measures 3.2 cm in diameter at the annulus, but 5.7 cm in diameter at the level of the sinuses of Valsalva. The sino-tubular junction is effaced. The thoracic aortic arch is normal in caliber at 2.8 cm. The isthmus of the thoracic aorta is mildly ectatic measuring 4 cm in diameter. The descending thoracic aorta is normal in caliber at 3 cm in diameter. No evidence of thoracic aortic dissection. Heart size is mildly enlarged. There is no significant pericardial fluid, thickening or pericardial calcification. There is atherosclerosis of the thoracic aorta, the great vessels of the mediastinum and the coronary arteries, including calcified atherosclerotic plaque in the left main and left anterior descending coronary arteries. Multiple borderline enlarged and mildly enlarged mediastinal and right hilar lymph nodes, largest of which is in the low right paratracheal nodal station measuring 14 mm in short axis.  Multiple densely calcified right hilar and mediastinal lymph nodes, presumably from old granulomatous disease. Esophagus is normal in appearance. Multiple borderline enlarged and mildly enlarged left axillary lymph nodes measuring up to 10 mm in short axis.  Lungs/Pleura: Large calcified granuloma in the posterior aspect of the right lower lobe with adjacent scarring. 12 x 7 mm right upper lobe pulmonary nodule (image 29 of series 506). No acute consolidative airspace disease. No pleural effusions. Diffuse subpleural bullae are noted in the apices of the lungs bilaterally. Small left-sided Bochdalek's hernia.  Musculoskeletal/Soft Tissues: There are no aggressive appearing lytic or blastic lesions noted in the visualized portions of the skeleton.  Review of the MIP images confirms the above findings.  CTA ABDOMEN AND PELVIS FINDINGS  Hepatobiliary: Status post cholecystectomy. 8 mm low attenuation lesion in segment 6 of the liver (image 180 of series 501), too small to characterize, but statistically likely a tiny cyst. No other suspicious appearing hepatic lesions. No intra or extrahepatic biliary ductal dilatation.  Pancreas: Unremarkable.  Spleen: Multiple calcifications in the spleen, and irregular splenic contour, which could relate to remote trauma or prior splenic infarcts.  Adrenals/Urinary Tract: Bilateral kidneys and bilateral adrenal glands are normal in appearance. No hydroureteronephrosis. Urinary bladder is normal in appearance.  Stomach/Bowel: Who normal appearance of the stomach. No pathologic dilatation of small bowel or colon. Normal appendix.  Vascular/Lymphatic: Atherosclerosis throughout the abdominal and pelvic vasculature. Aneurysmal dilatation of the common iliac arteries bilaterally which measure up to 1.6 cm in diameter bilaterally. No evidence of dissection. No lymphadenopathy noted in the abdomen or pelvis.  Reproductive: Prostate gland is enlarged and heterogeneous in appearance  measuring 6.3 x 4.1 cm. Seminal vesicles are unremarkable in appearance.  Other: No significant volume of ascites.  No pneumoperitoneum.  Musculoskeletal: There are no aggressive appearing lytic or blastic lesions noted in the visualized portions of the skeleton.  Review of the MIP images confirms the above findings.  IMPRESSION: 1. Aneurysmal dilatation of the aortic root, which measures up to 5.7 cm in diameter at the level of the sinuses of Valsalva. There is effacement of the sino-tubular junction. This is commonly seen in the setting of Marfan syndrome. No evidence of aortic dissection at this time. There is also some ectasia at of the isthmus of the thoracic aorta which measures up to 4 cm in diameter. 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 2. 12 x 7 mm right upper lobe pulmonary nodule (image 29 of series  506). Followup evaluation with PET-CT is recommended in the near future to evaluate for potential malignancy. 3. Left main and left anterior descending coronary artery disease. Assessment for potential risk factor modification, dietary therapy or pharmacologic therapy may be warranted, if clinically indicated. 4. Mild cardiomegaly. 89. Sequela of old granulomatous disease, as above. Multiple borderline enlarged and mildly enlarged mediastinal and right hilar lymph nodes that are noncalcified are also noted, as well as borderline enlarged and minimally enlarged left axillary lymph nodes. These are nonspecific, but clinical correlation to exclude the possibility of underlying lymphoproliferative disorder may be appropriate. 6. Additional incidental findings, as above.   Electronically Signed   By: Vinnie Langton M.D.   On: 07/10/2014 21:47   Ct Angio Chest Pe W/cm &/or Wo Cm  07/10/2014 CLINICAL DATA: 74 year old male with increasing shortness of breath for the past 2 weeks, cough, chest tightness and shortness  of breath, which worsens when lying flat. History of atrial fibrillation. History of thoracic aortic aneurysm. EXAM: CT ANGIOGRAPHY CHEST, ABDOMEN AND PELVIS TECHNIQUE: Multidetector CT imaging through the chest, abdomen and pelvis was performed using the standard protocol during bolus administration of intravenous contrast. Multiplanar reconstructed images and MIPs were obtained and reviewed to evaluate the vascular anatomy. CONTRAST: 153mL OMNIPAQUE IOHEXOL 350 MG/ML SOLN COMPARISON: None. FINDINGS: CTA CHEST FINDINGS Mediastinum/Lymph Nodes: Aneurysmal dilatation of the aortic root which measures 3.2 cm in diameter at the annulus, but 5.7 cm in diameter at the level of the sinuses of Valsalva. The sino-tubular junction is effaced. The thoracic aortic arch is normal in caliber at 2.8 cm. The isthmus of the thoracic aorta is mildly ectatic measuring 4 cm in diameter. The descending thoracic aorta is normal in caliber at 3 cm in diameter. No evidence of thoracic aortic dissection. Heart size is mildly enlarged. There is no significant pericardial fluid, thickening or pericardial calcification. There is atherosclerosis of the thoracic aorta, the great vessels of the mediastinum and the coronary arteries, including calcified atherosclerotic plaque in the left main and left anterior descending coronary arteries. Multiple borderline enlarged and mildly enlarged mediastinal and right hilar lymph nodes, largest of which is in the low right paratracheal nodal station measuring 14 mm in short axis. Multiple densely calcified right hilar and mediastinal lymph nodes, presumably from old granulomatous disease. Esophagus is normal in appearance. Multiple borderline enlarged and mildly enlarged left axillary lymph nodes measuring up to 10 mm in short axis. Lungs/Pleura: Large calcified granuloma in the posterior aspect of the right lower lobe with adjacent scarring. 12 x 7 mm right upper lobe pulmonary nodule (image 29  of series 506). No acute consolidative airspace disease. No pleural effusions. Diffuse subpleural bullae are noted in the apices of the lungs bilaterally. Small left-sided Bochdalek's hernia. Musculoskeletal/Soft Tissues: There are no aggressive appearing lytic or blastic lesions noted in the visualized portions of the skeleton. Review of the MIP images confirms the above findings. CTA ABDOMEN AND PELVIS FINDINGS Hepatobiliary: Status post cholecystectomy. 8 mm low attenuation lesion in segment 6 of the liver (image 180 of series 501), too small to characterize, but statistically likely a tiny cyst. No other suspicious appearing hepatic lesions. No intra or extrahepatic biliary ductal dilatation. Pancreas: Unremarkable. Spleen: Multiple calcifications in the spleen, and irregular splenic contour, which could relate to remote trauma or prior splenic infarcts. Adrenals/Urinary Tract: Bilateral kidneys and bilateral adrenal glands are normal in appearance. No hydroureteronephrosis. Urinary bladder is normal in appearance. Stomach/Bowel: Who normal appearance of the stomach. No pathologic  dilatation of small bowel or colon. Normal appendix. Vascular/Lymphatic: Atherosclerosis throughout the abdominal and pelvic vasculature. Aneurysmal dilatation of the common iliac arteries bilaterally which measure up to 1.6 cm in diameter bilaterally. No evidence of dissection. No lymphadenopathy noted in the abdomen or pelvis. Reproductive: Prostate gland is enlarged and heterogeneous in appearance measuring 6.3 x 4.1 cm. Seminal vesicles are unremarkable in appearance. Other: No significant volume of ascites. No pneumoperitoneum. Musculoskeletal: There are no aggressive appearing lytic or blastic lesions noted in the visualized portions of the skeleton. Review of the MIP images confirms the above findings. IMPRESSION: 1. Aneurysmal dilatation of the aortic root, which measures up to 5.7 cm in diameter at the level of  the sinuses of Valsalva. There is effacement of the sino-tubular junction. This is commonly seen in the setting of Marfan syndrome. No evidence of aortic dissection at this time. There is also some ectasia at of the isthmus of the thoracic aorta which measures up to 4 cm in diameter. 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 2. 12 x 7 mm right upper lobe pulmonary nodule (image 29 of series 506). Followup evaluation with PET-CT is recommended in the near future to evaluate for potential malignancy. 3. Left main and left anterior descending coronary artery disease. Assessment for potential risk factor modification, dietary therapy or pharmacologic therapy may be warranted, if clinically indicated. 4. Mild cardiomegaly. 59. Sequela of old granulomatous disease, as above. Multiple borderline enlarged and mildly enlarged mediastinal and right hilar lymph nodes that are noncalcified are also noted, as well as borderline enlarged and minimally enlarged left axillary lymph nodes. These are nonspecific, but clinical correlation to exclude the possibility of underlying lymphoproliferative disorder may be appropriate. 6. Additional incidental findings, as above. Electronically Signed By: Vinnie Langton M.D. On: 07/10/2014 21:47   Dg Chest Portable 1 View  07/10/2014 CLINICAL DATA: Shortness of breath and chest discomfort. EXAM: PORTABLE CHEST - 1 VIEW COMPARISON: Remote frontal and lateral views 10/23/2007 FINDINGS: The heart is enlarged. Mild prominence of central pulmonary vasculature without pulmonary edema. Lungs remain hyperinflated. No consolidation, pleural effusion, or pneumothorax. Minimal atelectasis or scarring at the left lung base. IMPRESSION: 1. Mild cardiomegaly. 2. Hyperinflation with left basilar atelectasis. Electronically Signed By: Jeb Levering M.D. On: 07/10/2014 02:25    I have  independently reviewed the above radiologic studies.  Cardiac Cath:FINDINGS: LV showed LV was moderately enlarged. There was global hypokinesia. EF of approximately 20% to 25%. Left main was very short, which was patent. LAD was patent. Diagonal 1 and 2 were patent. Left circumflex was patent. OM-1 was small, which was patent. OM-2 was moderate sized, which was patent. RCA was patent. PDA and PLV branches were patent.  The patient tolerated the procedure well. There were no complications. The patient was transferred to recovery room in stable condition  PLAN: To maximize beta-blockers and ACE inhibitors. We will start Eliquis tomorrow and possibly discharge tomorrow. The patient will be scheduled for PET scan as an outpatient and open heart surgery for aortic root replacement, possibly aortic valve as an outpatient.   Allegra Lai. Terrence Dupont, M.D.  I have independently reviewed the above cath films and reviewed the findings with the patient .   Recent Lab Findings:  Recent Labs    Lab Results  Component Value Date   WBC 4.8 07/15/2014   HGB 14.4 07/15/2014   HCT 43.4 07/15/2014   PLT 168 07/15/2014  GLUCOSE 113* 07/13/2014   ALT 149* 07/10/2014   AST 79* 07/10/2014   NA 138 07/13/2014   K 4.0 07/13/2014   CL 106 07/13/2014   CREATININE 0.88 07/13/2014   BUN 15 07/13/2014   CO2 24 07/13/2014   INR 1.47 07/14/2014     RecentCardiac CATH: 06/2014  FINDINGS: LV showed LV was moderately enlarged. There was global hypokinesia. EF of approximately 20% to 25%. Left main was very short, which was patent. LAD was patent. Diagonal 1 and 2 were patent. Left circumflex was patent. OM-1 was small, which was patent. OM-2 was moderate sized, which was patent. RCA was patent. PDA and PLV branches were patent.  The Swan findings were RA pressure was 3 mmHg, RV was 26/1, PA was 24/7, wedge  pressure was 16. Cardiac output by Fick was 4.10 and by thermodilution it was 4.65.  REPEAT ECHO: 07/2014: ------------------------------------------------------------------- LV EF: 15% -  20%  ------------------------------------------------------------------- Indications:   CHF - 428.0.  ------------------------------------------------------------------- History:  PMH:  Congestive heart failure.  ------------------------------------------------------------------- Study Conclusions  - Left ventricle: The cavity size was moderately dilated. The estimated ejection fraction was in the range of 15% to 20%. Wall motion was normal; there were no regional wall motion abnormalities. - Aortic valve: There was mild regurgitation. - Left atrium: The atrium was mildly dilated. - Atrial septum: No defect or patent foramen ovale was identified. - Pulmonary arteries: Systolic pressure was mildly increased.  Echocardiography. M-mode, complete 2D, spectral Doppler, and color Doppler. Birthdate: Patient birthdate: 1940/10/23. Age: Patient is 74 yr old. Sex: Gender: male.  BMI: 22.9 kg/m^2. Blood pressure:   114/72 Patient status: Outpatient. Study date: Study date: 08/07/2014. Study time: 10:02 AM. Location: Echo laboratory.  -------------------------------------------------------------------  ------------------------------------------------------------------- Left ventricle: The cavity size was moderately dilated. The estimated ejection fraction was in the range of 15% to 20%. Wall motion was normal; there were no regional wall motion abnormalities.  ------------------------------------------------------------------- Aortic valve:  Structurally normal valve.  Cusp separation was normal. Doppler: Transvalvular velocity was within the normal range. There was no stenosis. There was mild  regurgitation.  ------------------------------------------------------------------- Aorta: The aorta was moderately dilated. There was moderate dilation of the sinus(es) of Valsalva. Aortic root: The aortic root was moderately dilated. Ascending aorta: The ascending aorta was moderately dilated.  ------------------------------------------------------------------- Mitral valve:  Structurally normal valve.  Leaflet separation was normal. Doppler: Transvalvular velocity was within the normal range. There was no evidence for stenosis. There was trivial regurgitation.  ------------------------------------------------------------------- Left atrium: The atrium was mildly dilated.  ------------------------------------------------------------------- Atrial septum: No defect or patent foramen ovale was identified.  ------------------------------------------------------------------- Right ventricle: The cavity size was normal. Wall thickness was normal. Systolic function was normal.  ------------------------------------------------------------------- Pulmonic valve:  Doppler: There was no significant regurgitation.  ------------------------------------------------------------------- Tricuspid valve:  Structurally normal valve.  Leaflet separation was normal. Doppler: Transvalvular velocity was within the normal range. There was mild regurgitation.  ------------------------------------------------------------------- Pulmonary artery:  Systolic pressure was mildly increased.  ------------------------------------------------------------------- Right atrium: The atrium was normal in size.  ------------------------------------------------------------------- Pericardium: There was no pericardial effusion.  ------------------------------------------------------------------- Post procedure conclusions Ascending Aorta:  - The aorta was moderately  dilated.  ------------------------------------------------------------------- Measurements  Left ventricle              Value    Reference LV ID, ED, PLAX chordal     (H)   70.7 mm   43 - 52 LV ID, ES, PLAX chordal     (H)   67.5 mm  23 - 38 LV fx shortening, PLAX chordal  (L)   5   %   >=29 LV PW thickness, ED           8.12 mm   --------- IVS/LV PW ratio, ED           1.16     <=1.3 LV e&', lateral              8.49 cm/s  --------- LV E/e&', lateral             6.53     --------- LV e&', medial              6.09 cm/s  --------- LV E/e&', medial             9.1     --------- LV e&', average              7.29 cm/s  --------- LV E/e&', average             7.6     ---------  Ventricular septum            Value    Reference IVS thickness, ED            9.38 mm   ---------  Aortic valve               Value    Reference Aortic regurg pressure half-time     710  ms   ---------  Aorta                  Value    Reference Aortic root ID, ED            49  mm   ---------  Left atrium               Value    Reference LA ID, A-P, ES              44  mm   --------- LA ID/bsa, A-P              2.14 cm/m^2 <=2.2 LA volume, S               93.3 ml   --------- LA volume/bsa, S             45.5 ml/m^2 --------- LA volume, ES, 1-p A4C          74.6 ml   --------- LA volume/bsa, ES, 1-p A4C        36.4 ml/m^2 --------- LA volume, ES, 1-p A2C          107  ml   --------- LA volume/bsa, ES, 1-p A2C        52.2 ml/m^2 ---------  Mitral valve               Value     Reference Mitral E-wave peak velocity       55.4 cm/s  --------- Mitral A-wave peak velocity       39.8 cm/s  --------- Mitral deceleration time     (H)   246  ms   150 - 230 Mitral E/A ratio, peak          1.4     ---------  Pulmonary arteries            Value    Reference PA pressure, S, DP            20  mm Hg <=30  Tricuspid valve             Value    Reference Tricuspid regurg peak velocity      209  cm/s  --------- Tricuspid peak RV-RA gradient      17  mm Hg ---------  Systemic veins              Value    Reference Estimated CVP              3   mm Hg ---------  Right ventricle             Value    Reference RV pressure, S, DP            20  mm Hg <=30 RV s&', lateral, S            6.42 cm/s  ---------  Legend: (L) and (H) mark values outside specified reference range.  ------------------------------------------------------------------- Prepared and Electronically Authenticated by  Charolette Forward, MD 2016-04-08T13:05:59  ------------------------------------------------------------------- PFT's 07/12/2014 21.5 57% DLCO 25.45 67% I does get Response to bronchodilator Airtrapping Mild Diffusion Defect  Aortic Size Index=     5.7    /Body surface area is 2.05 meters squared. 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 No evidence of aortic dissection at this time. Ectasia at of the isthmus of the thoracic  aorta which measures up to 4 cm in diameter.  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 Back in afib controlled rate, follow up echo shows no improvement in LV function  5/Left main and left anterior descending coronary artery calcification on CT but without significant CAD at cath  6/ AFib-  on Elliquis  7/ COPD- Moderately severe Obstructive Airways Disease, Response to bronchodilator, Airtrapping, Mild Diffusion Defect  Recommend:   With depressed lv function out of proportion to CAD or degree of AI suspect af/tachycaria mediated lv dysfunction, but has not improved with follow up echo symptoms of HF  have improved   Will ask Dr Zoila Shutter to see in HF clinic, aggressive treatment of heart failure before proceeding with root replacement would be of benefit.   Aortic root replacement and poss MAZE if improvement in LV function  I  spent 15 minutes counseling the patient face to face and 50% or more the  time was spent in counseling and coordination of care.  The total time spent in the appointment was 20 minutes.      Grace Isaac MD      [address] [place] [address] Office [phone]   Beeper [phone]  08/26/2014 3:54 PM

## 2014-09-08 ENCOUNTER — Telehealth (HOSPITAL_COMMUNITY): Payer: Self-pay | Admitting: *Deleted

## 2014-09-08 ENCOUNTER — Encounter (HOSPITAL_COMMUNITY): Payer: Self-pay

## 2014-09-08 ENCOUNTER — Ambulatory Visit (HOSPITAL_COMMUNITY)
Admission: RE | Admit: 2014-09-08 | Discharge: 2014-09-08 | Disposition: A | Discharge status: Home / Self Care | Payer: Medicare Other | Source: Ambulatory Visit | Attending: Cardiology | Admitting: Cardiology

## 2014-09-08 VITALS — BP 112/60 | HR 74 | Wt 171.2 lb

## 2014-09-08 DIAGNOSIS — I7121 Aneurysm of the ascending aorta, without rupture: Secondary | ICD-10-CM

## 2014-09-08 DIAGNOSIS — I481 Persistent atrial fibrillation: Secondary | ICD-10-CM

## 2014-09-08 DIAGNOSIS — I5021 Acute systolic (congestive) heart failure: Secondary | ICD-10-CM

## 2014-09-08 DIAGNOSIS — J438 Other emphysema: Secondary | ICD-10-CM | POA: Insufficient documentation

## 2014-09-08 DIAGNOSIS — I5022 Chronic systolic (congestive) heart failure: Secondary | ICD-10-CM | POA: Diagnosis not present

## 2014-09-08 DIAGNOSIS — I4819 Other persistent atrial fibrillation: Secondary | ICD-10-CM

## 2014-09-08 DIAGNOSIS — I5023 Acute on chronic systolic (congestive) heart failure: Secondary | ICD-10-CM | POA: Insufficient documentation

## 2014-09-08 DIAGNOSIS — I719 Aortic aneurysm of unspecified site, without rupture: Secondary | ICD-10-CM | POA: Insufficient documentation

## 2014-09-08 LAB — CBC
HEMATOCRIT: 43.3 % (ref 39.0–52.0)
Hemoglobin: 15 g/dL (ref 13.0–17.0)
MCH: 30 pg (ref 26.0–34.0)
MCHC: 34.6 g/dL (ref 30.0–36.0)
MCV: 86.6 fL (ref 78.0–100.0)
Platelets: 207 10*3/uL (ref 150–400)
RBC: 5 MIL/uL (ref 4.22–5.81)
RDW: 13 % (ref 11.5–15.5)
WBC: 4.5 10*3/uL (ref 4.0–10.5)

## 2014-09-08 LAB — COMPREHENSIVE METABOLIC PANEL
ALBUMIN: 4.7 g/dL (ref 3.5–5.0)
ALK PHOS: 59 U/L (ref 38–126)
ALT: 52 U/L (ref 17–63)
AST: 36 U/L (ref 15–41)
Anion gap: 11 (ref 5–15)
BUN: 25 mg/dL — AB (ref 6–20)
CALCIUM: 9.9 mg/dL (ref 8.9–10.3)
CO2: 22 mmol/L (ref 22–32)
Chloride: 97 mmol/L — ABNORMAL LOW (ref 101–111)
Creatinine, Ser: 1.12 mg/dL (ref 0.61–1.24)
GFR calc Af Amer: >60 mL/min (ref >60)
GFR calc non Af Amer: >60 mL/min (ref >60)
Glucose, Bld: 111 mg/dL — ABNORMAL HIGH (ref 70–99)
POTASSIUM: 5.1 mmol/L (ref 3.5–5.1)
Sodium: 130 mmol/L — ABNORMAL LOW (ref 135–145)
TOTAL PROTEIN: 7.7 g/dL (ref 6.5–8.1)
Total Bilirubin: 1.2 mg/dL (ref 0.3–1.2)

## 2014-09-08 LAB — TSH: TSH: 5.188 u[IU]/mL — ABNORMAL HIGH (ref 0.350–4.500)

## 2014-09-08 MED ORDER — LISINOPRIL 5 MG PO TABS: 5.0000 mg | ORAL_TABLET | Freq: Two times a day (BID) | ORAL | Status: DC

## 2014-09-08 MED ORDER — CARVEDILOL 6.25 MG PO TABS: 9.3750 mg | ORAL_TABLET | Freq: Two times a day (BID) | ORAL | Status: DC

## 2014-09-08 NOTE — Telephone Encounter (Signed)
Records from [facility] faxed and placed in Dr. Aundra Dubin folder.

## 2014-09-08 NOTE — Telephone Encounter (Signed)
Dr. Aundra Dubin requested records for pt from Dr. Terrence Dupont office before his next appt in 2 weeks. I called to request records they asked me to fax what i needed with pt information. Faxed [phone] for records.

## 2014-09-08 NOTE — Progress Notes (Signed)
Patient ID: [name], male   DOB: [birth year], 74 y.o.   MRN: [id number]   74 yo with history of HTN and COPD was admitted in 3/16 with atrial fibrillation/RVR and dyspnea.  Echo showed low EF (15-20% range).  He had TEE-guided DCCV to NSR and cardiac catheterization.  Cath showed no obstructive coronary disease (nonischemic cardiomyopathy).  Echo showed dilated aortic root.  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  He has seen Dr Servando Snare for evaluation for aortic root replacement.   Patient has not felt palpitations but is back in atrial fibrillation today.  He is watching his sodium intake.  Weight is trending down.  He has occasional fatigue with heavy exertion but denies significant dyspnea.  He walks for exercise.  No chest pain.  No falls/syncope/lightheadedness.  No orthopnea/PND.  No family history of dissection or aneurysms.  Aortic valve is trileaflet.    Records were later obtained from Dr Zenia Resides office.  It appears he had no significant known cardiac problems prior to 3/16.  He was first noted to be in atrial fibrillation in 3/16.   ECG: atrial fibrillation at 73, RBBB, LAFB  Labs (3/16): HCT 43.4, K 4, creatinine 0.88  PMH: 1. HTN 2. Hyperlipidemia 3. Osteoarthritis 4. Persistent atrial fibrillation: Atrial fibrillation first noted in 3/16.  TEE-guided DCCV in 3/16.  Noted to be back in atrial fibrillation 5/16 CHF appt.  5. Right upper lobe lung nodule: PET negative.  6. COPD: PFTs (3/16) with FVC 75%, FEV1 57%, ratio 75%, TLC 101%, DLCO 67%.  Moderate to severe obstructive disease.  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  CTA chest (3/16) with 5.7 cm aortic root with effacement of STJ, no dissection. The aortic valve is trileaflet.  8. Nonischemic cardiomyopathy: Echo (4/16) with EF 15-20%, moderate LV dilation, mild AI.  RHC/LHC (3/16) with mean RA 3, PA 24/7, mean PCWP 16, CI  4.1, EF 20-25%, no significant CAD.   9. 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  Moderate by TEE in 3/16, mild by TTE in 4/16.   SH: Separated from wife, lives in [place], has not smoked for many years but has been around second hand smoke a lot, no ETOH.  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   FH: Father with MI at 65, mother with "heart trouble."   ROS: All systems reviewed and negative except as per HPI.   Current Outpatient Prescriptions  Medication Sig Dispense Refill  . ADVAIR DISKUS 250-50 MCG/DOSE AEPB Inhale 1 puff into the lungs 2 (two) times daily.     Marland Kitchen ALPRAZolam (XANAX) 0.25 MG tablet at bedtime as needed.     Marland Kitchen amiodarone (PACERONE) 200 MG tablet Take 1 tablet (200 mg total) by mouth daily. 30 tablet 3  . apixaban (ELIQUIS) 5 MG TABS tablet Take 5 mg by mouth 2 (two) times daily.    Marland Kitchen atorvastatin (LIPITOR) 20 MG tablet Take 20 mg by mouth daily at 6 PM.     . carvedilol (COREG) 6.25 MG tablet Take 1.5 tablets (9.375 mg total) by mouth 2 (two) times daily with a meal. 90 tablet 3  . lisinopril (PRINIVIL,ZESTRIL) 5 MG tablet Take 1 tablet (5 mg total) by mouth 2 (two) times daily. 60 tablet 3  . Multiple Vitamin (MULTIVITAMIN) tablet Take 1 tablet by mouth daily.    Marland Kitchen PROAIR  HFA 108 (90 BASE) MCG/ACT inhaler Inhale 1 puff into the lungs every 4 (four) hours as needed for wheezing or shortness of breath.     . SPIRIVA HANDIHALER 18 MCG inhalation capsule Place 18 mcg into inhaler and inhale daily.     Marland Kitchen spironolactone (ALDACTONE) 25 MG tablet Take 25 mg by mouth daily.      No current facility-administered medications for this encounter.   BP 112/60 mmHg  Pulse 74  Wt 171 lb 4 oz (77.678 kg)  SpO2 98% General: NAD Neck: No JVD, no thyromegaly or thyroid nodule.  Lungs: Mildly prolonged expiratory phase.  CV: Distant heart sounds.  Heart regular S1/S2, no S3/S4, no murmur.  No peripheral edema.  No carotid bruit.   Normal pedal pulses.  Abdomen: Soft, nontender, no hepatosplenomegaly, no distention.  Skin: Intact without lesions or rashes.  Neurologic: Alert and oriented x 3.  Psych: Normal affect. Extremities: No clubbing or cyanosis.  HEENT: Normal.  MSK: Joints not hypermobile.   Assessment/Plan: 1. Chronic systolic CHF: Nonischemic cardiomyopathy.  4/16 echo with EF 15-20%.  Possible tachycardia-mediated cardiomyopathy but no improvement with NSR/rate control when echo was done in 4/16.  Coronary angiography 3/16 without significant CAD.  TEE in 3/16 showed moderate AI and echo 4/16 showed mild AI, so doubt AI contributes significantly to cardiomyopathy.  He is not volume overloaded on exam, NYHA class II symptoms.  - Today will increase Coreg to 9.375 mg bid and lisinopril to 5 mg bid.  Continue spironolactone 25 daily.  - BMET/BNP today, repeat BMET in 2 wks.  - Return in 2 wks for further medication titration.  - Will obtain cardiac MRI after 3 months of medical treatment (8/16).  This will allow [place] to look for any evidence for myocarditis or infiltrative disease.  - Will need to send SPEP/UPEP for completeness.  2. Atrial fibrillation: Persistent.  First identified in 3/16.  He is now on Eliquis and amiodarone 200 mg daily.  He had DCCV in 3/16 but has subsequently gone back into atrial fibrillation.  Rate is controlled currently.  Given cardiomyopathy, would be reasonable to get him out of atrial fibrillation.   - I will increase amiodarone to 200 mg bid.  Check CBC, LFTs and TSH today. He will need regular eye exams with use of amiodarone.  - Will discuss setting up DCCV when I see him back in 2 wks if he remains in atrial fibrillation.  TEE would not be needed as long as he does not miss doses of Eliquis.  3. Aortic root dilation: 5.7 cm aortic root with STJ effacement on CTA chest.  No dissection.  Cause uncertain. Aortic valve is trileaflet, BP is controlled.  He is relatively tall and thin, but  not marfanoid, per se.  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  Aortic root size large enough to warrant prophylactic repair, however would like to aggressively treat his cardiomyopathy prior to taking him for surgery.  4. 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  Moderate by 3/16 TEE, mild by 4/16 TTE.   5. COPD: Moderately severe obstructive lung disease.  Has not smoked for years.  Had second hand smoke exposure. Would like to have him evaluated by pulmonary prior to surgery.   [name] 09/09/2014

## 2014-09-08 NOTE — Patient Instructions (Signed)
INCREASE Coreg to 9.375 mg (1 and 1/2 tablets) twice a day.  INCREASE Lisinopril to 5 mg (1 tablet) twice a day.  LABS today. (cmet cbc tsh)  FOLLOW UP in 2 weeks with labs (bmet)

## 2014-09-09 DIAGNOSIS — J439 Emphysema, unspecified: Secondary | ICD-10-CM | POA: Insufficient documentation

## 2014-09-09 DIAGNOSIS — I719 Aortic aneurysm of unspecified site, without rupture: Secondary | ICD-10-CM | POA: Insufficient documentation

## 2014-09-09 DIAGNOSIS — I4819 Other persistent atrial fibrillation: Secondary | ICD-10-CM | POA: Insufficient documentation

## 2014-09-09 DIAGNOSIS — I7121 Aneurysm of the ascending aorta, without rupture: Secondary | ICD-10-CM | POA: Insufficient documentation

## 2014-09-09 DIAGNOSIS — I5022 Chronic systolic (congestive) heart failure: Secondary | ICD-10-CM | POA: Insufficient documentation

## 2014-09-22 ENCOUNTER — Telehealth: Payer: Self-pay | Admitting: Cardiology

## 2014-09-22 ENCOUNTER — Encounter (HOSPITAL_COMMUNITY): Payer: Self-pay

## 2014-09-22 ENCOUNTER — Ambulatory Visit (HOSPITAL_COMMUNITY)
Admission: RE | Admit: 2014-09-22 | Discharge: 2014-09-22 | Disposition: A | Discharge status: Home / Self Care | Payer: Medicare Other | Source: Ambulatory Visit | Attending: Cardiology | Admitting: Cardiology

## 2014-09-22 VITALS — BP 90/64 | HR 68 | Resp 18 | Wt 168.0 lb

## 2014-09-22 DIAGNOSIS — Z7722 Contact with and (suspected) exposure to environmental tobacco smoke (acute) (chronic): Secondary | ICD-10-CM | POA: Diagnosis not present

## 2014-09-22 DIAGNOSIS — Z87891 Personal history of nicotine dependence: Secondary | ICD-10-CM | POA: Insufficient documentation

## 2014-09-22 DIAGNOSIS — I481 Persistent atrial fibrillation: Secondary | ICD-10-CM | POA: Insufficient documentation

## 2014-09-22 DIAGNOSIS — I5022 Chronic systolic (congestive) heart failure: Secondary | ICD-10-CM | POA: Insufficient documentation

## 2014-09-22 DIAGNOSIS — Z79899 Other long term (current) drug therapy: Secondary | ICD-10-CM | POA: Insufficient documentation

## 2014-09-22 DIAGNOSIS — I77819 Aortic ectasia, unspecified site: Secondary | ICD-10-CM | POA: Diagnosis not present

## 2014-09-22 DIAGNOSIS — Z7901 Long term (current) use of anticoagulants: Secondary | ICD-10-CM | POA: Diagnosis not present

## 2014-09-22 DIAGNOSIS — I429 Cardiomyopathy, unspecified: Secondary | ICD-10-CM | POA: Insufficient documentation

## 2014-09-22 DIAGNOSIS — I4819 Other persistent atrial fibrillation: Secondary | ICD-10-CM

## 2014-09-22 DIAGNOSIS — I10 Essential (primary) hypertension: Secondary | ICD-10-CM | POA: Insufficient documentation

## 2014-09-22 DIAGNOSIS — I351 Nonrheumatic aortic (valve) insufficiency: Secondary | ICD-10-CM | POA: Insufficient documentation

## 2014-09-22 DIAGNOSIS — E785 Hyperlipidemia, unspecified: Secondary | ICD-10-CM | POA: Insufficient documentation

## 2014-09-22 DIAGNOSIS — J449 Chronic obstructive pulmonary disease, unspecified: Secondary | ICD-10-CM | POA: Diagnosis not present

## 2014-09-22 DIAGNOSIS — J438 Other emphysema: Secondary | ICD-10-CM | POA: Diagnosis not present

## 2014-09-22 LAB — BASIC METABOLIC PANEL
Anion gap: 9 (ref 5–15)
BUN: 31 mg/dL — ABNORMAL HIGH (ref 6–20)
CO2: 23 mmol/L (ref 22–32)
Calcium: 9.6 mg/dL (ref 8.9–10.3)
Chloride: 95 mmol/L — ABNORMAL LOW (ref 101–111)
Creatinine, Ser: 1.36 mg/dL — ABNORMAL HIGH (ref 0.61–1.24)
GFR calc Af Amer: 58 mL/min — ABNORMAL LOW (ref >60)
GFR calc non Af Amer: 50 mL/min — ABNORMAL LOW (ref >60)
Glucose, Bld: 118 mg/dL — ABNORMAL HIGH (ref 65–99)
Sodium: 127 mmol/L — ABNORMAL LOW (ref 135–145)

## 2014-09-22 LAB — CBC
HCT: 39.9 % (ref 39.0–52.0)
Hemoglobin: 14.1 g/dL (ref 13.0–17.0)
MCH: 30.1 pg (ref 26.0–34.0)
MCHC: 35.3 g/dL (ref 30.0–36.0)
MCV: 85.3 fL (ref 78.0–100.0)
PLATELETS: 265 10*3/uL (ref 150–400)
RBC: 4.68 MIL/uL (ref 4.22–5.81)
RDW: 13.2 % (ref 11.5–15.5)
WBC: 4.7 10*3/uL (ref 4.0–10.5)

## 2014-09-22 NOTE — Addendum Note (Signed)
Encounter addended by: Scarlette Calico, RN on: 09/22/2014  5:00 PM<BR>     Documentation filed: Visit Diagnoses, Dx Association, Orders

## 2014-09-22 NOTE — Patient Instructions (Addendum)
Stop Spironolactone  Labs today  Your physician has recommended that you have a Cardioversion (DCCV). Electrical Cardioversion uses a jolt of electricity to your heart either through paddles or wired patches attached to your chest. This is a controlled, usually prescheduled, procedure. Defibrillation is done under light anesthesia in the hospital, and you usually go home the day of the procedure. This is done to get your heart back into a normal rhythm. You are not awake for the procedure. Please see the instruction sheet given to you today.  Your physician recommends that you schedule a follow-up appointment in: 3 weeks

## 2014-09-22 NOTE — Progress Notes (Signed)
Patient ID: [name], male   DOB: 04-28-1941, 74 y.o.   MRN: [id number]  74 yo with history of HTN and COPD was admitted in 3/16 with atrial fibrillation/RVR and dyspnea.  Echo showed low EF (15-20% range).  He had TEE-guided DCCV to NSR and cardiac catheterization.  Cath showed no obstructive coronary disease (nonischemic cardiomyopathy).  Echo showed dilated aortic root.  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  He has seen Dr Servando Snare for evaluation for aortic root replacement.   Patient has not felt palpitations but is back in atrial fibrillation today.  He is watching his sodium intake.  Weight is trending down.  He has occasional fatigue with heavy exertion but denies significant dyspnea.  He walks for exercise.  No chest pain.  No falls/syncope/lightheadedness.  No orthopnea/PND.  No family history of dissection or aneurysms.  Aortic valve is trileaflet.    He returns for follow up. Last visit amio was increased as well as carvedilol was increased. Dizzy this morning. Able to walk 1 mile in 20 minutes. Denies SOB/PND/Orthopnea. Taking all medications. Taking Eliquis. Not bleeding at all.  Appetite fair.  He was orthostatic when checked in office today. He remains in atria fibrillation.   Labs (3/16): HCT 43.4, K 4, creatinine 0.88 Labs (09/08/2014): K 5.1 Creatinine 1.12 Hgb 15.0   PMH: 1. HTN 2. Hyperlipidemia 3. Osteoarthritis 4. Persistent atrial fibrillation: Atrial fibrillation first noted in 3/16.  TEE-guided DCCV in 3/16.  Noted to be back in atrial fibrillation 5/16 CHF appt.  5. Right upper lobe lung nodule: PET negative.  6. COPD: PFTs (3/16) with FVC 75%, FEV1 57%, ratio 75%, TLC 101%, DLCO 67%.  Moderate to severe obstructive disease.  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  CTA chest (3/16) with 5.7 cm aortic root with effacement of STJ, no dissection. The aortic valve is  trileaflet.  8. Nonischemic cardiomyopathy: Echo (4/16) with EF 15-20%, moderate LV dilation, mild AI.  RHC/LHC (3/16) with mean RA 3, PA 24/7, mean PCWP 16, CI 4.1, EF 20-25%, no significant CAD.   9. 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  Moderate by TEE in 3/16, mild by TTE in 4/16.   SH: Separated from wife, lives in [place], has not smoked for many years but has been around second hand smoke a lot, no ETOH.  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   FH: Father with MI at 39, mother with "heart trouble."   ROS: All systems reviewed and negative except as per HPI.   Current Outpatient Prescriptions  Medication Sig Dispense Refill  . ADVAIR DISKUS 250-50 MCG/DOSE AEPB Inhale 1 puff into the lungs 2 (two) times daily.     Marland Kitchen ALPRAZolam (XANAX) 0.25 MG tablet at bedtime as needed.     Marland Kitchen amiodarone (PACERONE) 200 MG tablet Take 1 tablet (200 mg total) by mouth daily. 30 tablet 3  . apixaban (ELIQUIS) 5 MG TABS tablet Take 5 mg by mouth 2 (two) times daily.    Marland Kitchen atorvastatin (LIPITOR) 20 MG tablet Take 20 mg by mouth daily at 6 PM.     . carvedilol (COREG) 6.25 MG tablet Take 1.5 tablets (9.375 mg total) by mouth 2 (two) times daily with a meal. 90 tablet 3  . lisinopril (PRINIVIL,ZESTRIL) 5 MG tablet Take 1 tablet (5 mg total) by mouth 2 (two)  times daily. 60 tablet 3  . Multiple Vitamin (MULTIVITAMIN) tablet Take 1 tablet by mouth daily.    Marland Kitchen PROAIR HFA 108 (90 BASE) MCG/ACT inhaler Inhale 1 puff into the lungs every 4 (four) hours as needed for wheezing or shortness of breath.     . SPIRIVA HANDIHALER 18 MCG inhalation capsule Place 18 mcg into inhaler and inhale daily.     Marland Kitchen spironolactone (ALDACTONE) 25 MG tablet Take 25 mg by mouth daily.      No current facility-administered medications for this encounter.   BP 90/64 mmHg  Pulse 78  Resp 18  Wt 168 lb 8 oz (76.431 kg)  SpO2 98%   Standing SBP 78/48 General: NAD Neck: No  JVD flat , no thyromegaly or thyroid nodule.  Lungs: Mildly prolonged expiratory phase.  CV: Distant heart sounds.  Heart regular S1/S2, no S3/S4, no murmur.  No peripheral edema.  No carotid bruit.  Normal pedal pulses.  Abdomen: Soft, nontender, no hepatosplenomegaly, no distention.  Skin: Intact without lesions or rashes.  Neurologic: Alert and oriented x 3.  Psych: Normal affect. Extremities: No clubbing or cyanosis.  HEENT: Normal.  MSK: Joints not hypermobile.   EKG- A fib 68 bpm   Assessment/Plan: 1. Chronic systolic CHF: Nonischemic cardiomyopathy.  4/16 echo with EF 15-20%.  Possible tachycardia-mediated cardiomyopathy but no improvement with NSR/rate control when echo was done in 4/16.  Coronary angiography 3/16 without significant CAD.  TEE in 3/16 showed moderate AI and echo 4/16 showed mild AI, so doubt AI contributes significantly to cardiomyopathy. Volume status low. Orthostatic today.  Appears dry. Give 500 cc NS.   - Continue Coreg 9.375 mg bid and lisinopril 5 mg bid.   - Stop spironolactone.   - Will obtain cardiac MRI after 3 months of medical treatment (8/16).  This will allow [place] to look for any evidence for myocarditis or infiltrative disease.  - Will need to send SPEP/UPEP for completeness.  2. Atrial fibrillation: Persistent.  First identified in 3/16.  He is now on Eliquis and amiodarone 200 mg daily.  He had DCCV in 3/16 but has subsequently gone back into atrial fibrillation.  Rate is controlled currently.  Given cardiomyopathy, would be reasonable to get him out of atrial fibrillation.   - Continue amiodarone 200 mg bid.  LFTs normal recently but TSH mildly elevated.  Will need to recheck TSH with free T3 and free T4.  - Will need DCCV. Has been taking eliquis. TEE would not be needed as long as he does not miss doses of Eliquis.  Arrange for next week.  3. Aortic root dilation: 5.7 cm aortic root with STJ effacement on CTA chest.  No dissection.  Cause uncertain.  Aortic valve is trileaflet, BP is controlled.  He is relatively tall and thin, but not marfanoid, per se.  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  Aortic root size large enough to warrant prophylactic repair, however would like to aggressively treat his cardiomyopathy prior to taking him for surgery.  4. 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  Moderate by 3/16 TEE, mild by 4/16 TTE.   5. COPD: Moderately severe obstructive lung disease.  Has not smoked for years.  Had second hand smoke exposure. Would like to have him evaluated by pulmonary prior to surgery.   Follow up in 2 weeks.   CLEGG,AMY NP-C  09/22/2014   Patient seen with NP, agree with the above note.  He is orthostatic today, hypovolemic.  Will stop  spironolactone and will give him IV fluid in the office today.  Check BMET today.  Will continue his other meds as they have been ordered unless creatinine is significantly elevated (in that case, will cut back on lisinopril).    He is still in atrial fibrillation, will arrange for DCCV next week.  He has missed no Eliquis doses.   [name] 09/22/2014 4:52 PM

## 2014-09-22 NOTE — Progress Notes (Addendum)
500 cc bolus completed, IV d/c'd catheter intact, recheck BP 108/58

## 2014-09-22 NOTE — Progress Notes (Signed)
22 gauge IV started in left forearm, 500 cc bag of NS started

## 2014-09-22 NOTE — Addendum Note (Signed)
Encounter addended by: Scarlette Calico, RN on: 09/22/2014  5:03 PM<BR>     Documentation filed: Vitals Section, Notes Section, Flowsheet VN

## 2014-09-22 NOTE — Addendum Note (Signed)
Encounter addended by: Scarlette Calico, RN on: 09/22/2014  5:04 PM<BR>     Documentation filed: Patient Instructions Section, Medications, Orders

## 2014-09-22 NOTE — Telephone Encounter (Signed)
  Received call from outpatient lab regarding a critical lab value for potassium with K at 7.5. Lab tech confirmed that the sample was hemolyzed. I ordered for a repeat BMP. Patient was notified that he will need to return to lab for repeat blood work.  SIMMONS, BRITTAINY 09/22/2014 7:03 PM

## 2014-09-23 ENCOUNTER — Ambulatory Visit (HOSPITAL_COMMUNITY)
Admission: RE | Admit: 2014-09-23 | Discharge: 2014-09-23 | Disposition: A | Discharge status: Home / Self Care | Payer: Medicare Other | Source: Ambulatory Visit | Attending: Cardiology | Admitting: Cardiology

## 2014-09-23 ENCOUNTER — Telehealth (HOSPITAL_COMMUNITY): Payer: Self-pay | Admitting: Vascular Surgery

## 2014-09-23 DIAGNOSIS — I5022 Chronic systolic (congestive) heart failure: Secondary | ICD-10-CM | POA: Insufficient documentation

## 2014-09-23 LAB — BASIC METABOLIC PANEL
Anion gap: 7 (ref 5–15)
BUN: 24 mg/dL — ABNORMAL HIGH (ref 6–20)
CO2: 24 mmol/L (ref 22–32)
Calcium: 9.5 mg/dL (ref 8.9–10.3)
Chloride: 98 mmol/L — ABNORMAL LOW (ref 101–111)
Creatinine, Ser: 1.23 mg/dL (ref 0.61–1.24)
GFR calc Af Amer: >60 mL/min (ref >60)
GFR calc non Af Amer: 57 mL/min — ABNORMAL LOW (ref >60)
Glucose, Bld: 125 mg/dL — ABNORMAL HIGH (ref 65–99)
Potassium: 5.2 mmol/L — ABNORMAL HIGH (ref 3.5–5.1)
Sodium: 129 mmol/L — ABNORMAL LOW (ref 135–145)

## 2014-09-23 NOTE — Telephone Encounter (Signed)
Talked to pt he would like to call back to schedule 3 week f/u

## 2014-09-24 ENCOUNTER — Ambulatory Visit (INDEPENDENT_AMBULATORY_CARE_PROVIDER_SITE_OTHER): Payer: Medicare Other | Admitting: Cardiothoracic Surgery

## 2014-09-24 ENCOUNTER — Encounter: Payer: Self-pay | Admitting: Cardiothoracic Surgery

## 2014-09-24 VITALS — BP 100/61 | HR 72 | Resp 20 | Ht 74.0 in | Wt 170.0 lb

## 2014-09-24 DIAGNOSIS — I2510 Atherosclerotic heart disease of native coronary artery without angina pectoris: Secondary | ICD-10-CM

## 2014-09-24 DIAGNOSIS — I351 Nonrheumatic aortic (valve) insufficiency: Secondary | ICD-10-CM

## 2014-09-24 DIAGNOSIS — R911 Solitary pulmonary nodule: Secondary | ICD-10-CM | POA: Diagnosis not present

## 2014-09-24 NOTE — Progress Notes (Signed)
[address]       [place] [address]             [phone]                    Joseph F Ferrero [place] Medical Record #[id number] Date of Birth: Jun 16, 1940  Referring: Charolette Forward, MD Primary Care: Charolette Forward, MD  Chief Complaint:    Chief Complaint  Patient presents with  . Coronary Artery Disease    4 week f/u, Cardioversion scheduld for 10/01/2014  . Aortic Insuffiency  . Lung Lesion     Past Medical History  Diagnosis Date  . A-fib   . Hypertension   . Asthma   . CHF (congestive heart failure)   History of GI Bleed In 1987  Previous history of bilaterial knee replacement, cholecystectomy, inguinal hernias repairs  Past Surgical History  Procedure Laterality Date  . Tee without cardioversion N/A 07/13/2014    Procedure: TRANSESOPHAGEAL ECHOCARDIOGRAM (TEE) WITH CARDIOVERSION;  Surgeon: Dixie Dials, MD;  Location: [facility];  Service: Cardiovascular;  Laterality: N/A;  . Cardioversion N/A 07/13/2014    Procedure: CARDIOVERSION;  Surgeon: Dixie Dials, MD;  Location: [facility];  Service: Cardiovascular;  Laterality: N/A;  . Left and right heart catheterization with coronary angiogram N/A 07/14/2014    Procedure: LEFT AND RIGHT HEART CATHETERIZATION WITH CORONARY ANGIOGRAM;  Surgeon: Charolette Forward, MD;  Location: [facility] CATH LAB;  Service: Cardiovascular;  Laterality: N/A;   History  Smoking status  . smoker for 4-5 years quit in 1960  Smokeless tobacco  . none   History  Alcohol Use No    History   Social History  . Marital Status: Married    Spouse Name: N/A  . Number of Children: None    Occupational History  . retired from [employer]     No Known Allergies  Current Facility-Administered Medications  Medication Dose Route Frequency Provider Last Rate Last Dose  . 0.9 % sodium chloride infusion 250 mL Intravenous PRN Dixie Dials,  MD    . 0.9 % sodium chloride infusion  Intravenous Continuous Charolette Forward, MD  Stopped at 07/15/14 0436  . acetaminophen (TYLENOL) tablet 650 mg 650 mg Oral Q4H PRN Charolette Forward, MD    . albuterol (PROVENTIL) (2.5 MG/3ML) 0.083% nebulizer solution 3 mL 3 mL Inhalation Q4H PRN Charolette Forward, MD  3 mL at 07/13/14 1500  . ALPRAZolam Duanne Moron) tablet 0.25 mg 0.25 mg Oral BID Charolette Forward, MD  0.25 mg at 07/14/14 2239  . amiodarone (PACERONE) tablet 200 mg 200 mg Oral BID Charolette Forward, MD  200 mg at 07/14/14 2239  . apixaban (ELIQUIS) tablet 5 mg 5 mg Oral BID Charolette Forward, MD  5 mg at 07/14/14 2239  . atorvastatin (LIPITOR) tablet 20 mg 20 mg Oral q1800 Charolette Forward, MD  20 mg at 07/14/14 1700  . carvedilol (COREG) tablet 3.125 mg 3.125 mg Oral BID WC Dixie Dials, MD  3.125 mg at 07/14/14 1657  . lisinopril (PRINIVIL,ZESTRIL) tablet 5 mg 5 mg Oral Daily Charolette Forward, MD  5 mg at 07/14/14 1721  . mometasone-formoterol (DULERA) 100-5 MCG/ACT inhaler 2 puff 2 puff Inhalation BID Charolette Forward, MD  2 puff at 07/15/14 0742  . ondansetron (ZOFRAN) injection 4 mg 4 mg Intravenous Q6H PRN Charolette Forward, MD    . sodium chloride 0.9 % injection 3 mL 3 mL Intravenous Q12H Charolette Forward, MD  3 mL at  07/12/14 0949  . sodium chloride 0.9 % injection 3 mL 3 mL Intravenous PRN Charolette Forward, MD    . spironolactone (ALDACTONE) tablet 25 mg 25 mg Oral Daily Charolette Forward, MD  25 mg at 07/14/14 1011  . tiotropium (SPIRIVA) inhalation capsule 18 mcg 18 mcg Inhalation Daily Charolette Forward, MD  18 mcg at 07/15/14 7867    Prescriptions prior to admission  Medication Sig Dispense Refill Last Dose  . ADVAIR DISKUS 250-50 MCG/DOSE AEPB Inhale 1 puff into the lungs 2 (two) times daily.    07/09/2014 at Unknown time  . apixaban (ELIQUIS) 5 MG TABS  tablet Take 5 mg by mouth 2 (two) times daily.   07/09/2014 at Unknown time  . atorvastatin (LIPITOR) 20 MG tablet Take 20 mg by mouth daily at 6 PM.    07/09/2014 at Unknown time  . azithromycin (ZITHROMAX) 250 MG tablet Take 250 mg by mouth daily.    07/09/2014 at Unknown time  . metoprolol succinate (TOPROL-XL) 50 MG 24 hr tablet Take 100 mg by mouth daily.    07/09/2014 at 0800  . PROAIR HFA 108 (90 BASE) MCG/ACT inhaler Inhale 1 puff into the lungs every 4 (four) hours as needed for wheezing or shortness of breath.    07/09/2014 at Unknown time  . SPIRIVA HANDIHALER 18 MCG inhalation capsule Place 18 mcg into inhaler and inhale daily.    07/09/2014 at Unknown time  . spironolactone (ALDACTONE) 25 MG tablet Take 25 mg by mouth daily.    07/09/2014 at Unknown time    Family History: Only child , no children, father died suddenly at home at age 19 , ? MI No family history of dissection or aortic any serum   Review of Systems:    Cardiac Review of Systems: Y or N Chest Pain [ n ] Resting SOB [ y ]Exertional SOB [ y ] Orthopnea Blue.Reese ] Pedal Edema Blue.Reese ] Palpitations Blue.Reese ]Syncope [ n ] Presyncope [ y ] General Review of Systems: [Y] = yes [ ] =no Constitional: recent weight change Vixen.Miu ]; anorexia [ ] ; fatigue Blue.Reese ]; nausea [ ] ; night sweats [ ] ; fever [n ]; or chills [ n ]  Dental: poor dentition[ y ]; Last Dentist visit: Has upper plates and lower implants  Eye : blurred vision [ n ]; diplopia [ ] ; vision changes [ ] ; Amaurosis fugax[ ] ; Resp: cough Blue.Reese ]; wheezing[ y ]; hemoptysis[ n ]; shortness of breath[ y ]; paroxysmal nocturnal dyspnea[ ] ; dyspnea on exertion[y ]; or orthopnea[y ];  GI: gallstones[ ] , vomiting[ ] ; dysphagia[ ] ; melena[ ] ; hematochezia [ ] ;  heartburn[ ] ; Hx of Colonoscopy[y ]; GU: kidney stones [ ] ; hematuria[ n ]; dysuria [ ] ; nocturia[ ] ; history of obstruction [ ] ; urinary frequency [ n ]  Skin: rash, swelling[ ] ;, hair loss[ ] ; peripheral edema[ ] ; or itching[ ] ; Musculosketetal: myalgias[ ] ; joint swelling[ y ]; joint erythema[ y ]; joint pain[ ] ; back pain[ ] ; Heme/Lymph: bruising[ ] ; bleeding[ ] ; anemia[ ] ;  Neuro: TIA[ ] ; headaches[ ] ; stroke[ ] ; vertigo[ ] ; seizures[ n ]; paresthesias[ ] ; difficulty walking[ n ]; Psych:depression[ ] ; anxiety[ ] ; Endocrine: diabetes[n ]; thyroid dysfunction[ n ]; Immunizations: Flu [n ]; Pneumococcal[ n]; Other:  Physical Exam: BP 100/61 mmHg  Pulse 72  Resp 20  Ht 6\' 2"  (1.88 m)  Wt 170 lb (77.111 kg)  BMI 21.82 kg/m2  SpO2 98%   General appearance: alert, cooperative, appears older than stated age and no distress Head:  Normocephalic, without obvious abnormality, atraumatic Neck: no adenopathy, no carotid bruit, no JVD, supple, symmetrical, trachea midline and thyroid not enlarged, symmetric, no tenderness/mass/nodules Lymph nodes: Cervical, supraclavicular, and axillary nodes normal. Resp: clear to auscultation bilaterally Back: symmetric, no curvature. ROM normal. No CVA tenderness. Cardio: now regular Rhythm, ii/vi m of AI GI: soft, non-tender; bowel sounds normal; no masses, no organomegaly Extremities: extremities normal, atraumatic, no cyanosis or edema and Homans sign is negative, no sign of DVT Neurologic: Grossly normal No carotid bruits Palpable dp and pt pulses, bilateral bilateral knee incisions from knee replacement   Diagnostic Studies & Laboratory data:   Recent Radiology Findings:     Nm Pet Image Initial (pi) Skull Base To Thigh  07/24/2014   CLINICAL DATA:  Initial treatment strategy for right upper lobe pulmonary nodule.   EXAM: NUCLEAR MEDICINE PET SKULL BASE TO THIGH  TECHNIQUE: 10.2 mCi F-18 FDG was injected intravenously. Full-ring PET imaging was performed from the skull base to thigh after the radiotracer. CT data was obtained and used for attenuation correction and anatomic localization.  FASTING BLOOD GLUCOSE:  Value: 109 mg/dl  COMPARISON:  CTA of the chest of 07/10/2014.  FINDINGS: NECK  No areas of abnormal hypermetabolism.  CHEST  No hypermetabolism within the right upper lobe. The nodule detailed on the prior exam likely represented branching vessels. Example image 32 of series 6 today. There is equivocal soft tissue fullness in this area on image 72 of series 501 of the prior CT. No thoracic nodal hypermetabolism.  ABDOMEN/PELVIS  No areas of abnormal hypermetabolism.  SKELETON  Hypermetabolism involving a posterior right-sided cervical facet. Likely degenerative.  CT IMAGES PERFORMED FOR ATTENUATION CORRECTION  No cervical adenopathy.  Chest, abdomen, and pelvic findings deferred to recent diagnostic CT. No acute superimposed process. Cholecystectomy. Old granulomatous disease in the spleen. Prostatomegaly. Degenerative partial fusion of the bilateral sacroiliac joints.  IMPRESSION: No right upper lobe hypermetabolism to correspond to the right upper lobe Nodule detailed on the prior exam. This is favored to represent a branching vessel, when correlated with today's CT. As there is equivocal soft tissue fullness in this area on thin slice imaging of the prior diagnostic CTA, followup with chest CT at 6 months should be considered.   Electronically Signed   By: Abigail Miyamoto M.D.   On: 07/24/2014 15:57   Dg Chest Portable 1 View  07/10/2014   CLINICAL DATA:  Shortness of breath and chest discomfort.  EXAM: PORTABLE CHEST - 1 VIEW  COMPARISON:  Remote frontal and lateral views 10/23/2007  FINDINGS: The heart is enlarged. Mild prominence of central pulmonary vasculature without pulmonary edema. Lungs remain  hyperinflated. No consolidation, pleural effusion, or pneumothorax. Minimal atelectasis or scarring at the left lung base.  IMPRESSION: 1. Mild cardiomegaly. 2. Hyperinflation with left basilar atelectasis.   Electronically Signed   By: Jeb Levering M.D.   On: 07/10/2014 02:25   Ct Angio Abd/pel W/ And/or W/o  07/10/2014   CLINICAL DATA:  74 year old male with increasing shortness of breath for the past 2 weeks, cough, chest tightness and shortness of breath, which worsens when lying flat. History of atrial fibrillation. History of thoracic aortic aneurysm.  EXAM: CT ANGIOGRAPHY CHEST, ABDOMEN AND PELVIS  TECHNIQUE: Multidetector CT imaging through the chest, abdomen and pelvis was performed using the standard protocol during bolus administration of intravenous contrast. Multiplanar reconstructed images and MIPs were obtained and reviewed to evaluate the vascular anatomy.  CONTRAST:  159mL OMNIPAQUE IOHEXOL 350 MG/ML  SOLN  COMPARISON:  None.  FINDINGS: CTA CHEST FINDINGS  Mediastinum/Lymph Nodes: Aneurysmal dilatation of the aortic root which measures 3.2 cm in diameter at the annulus, but 5.7 cm in diameter at the level of the sinuses of Valsalva. The sino-tubular junction is effaced. The thoracic aortic arch is normal in caliber at 2.8 cm. The isthmus of the thoracic aorta is mildly ectatic measuring 4 cm in diameter. The descending thoracic aorta is normal in caliber at 3 cm in diameter. No evidence of thoracic aortic dissection. Heart size is mildly enlarged. There is no significant pericardial fluid, thickening or pericardial calcification. There is atherosclerosis of the thoracic aorta, the great vessels of the mediastinum and the coronary arteries, including calcified atherosclerotic plaque in the left main and left anterior descending coronary arteries. Multiple borderline enlarged and mildly enlarged mediastinal and right hilar lymph nodes, largest of which is in the low right paratracheal nodal  station measuring 14 mm in short axis. Multiple densely calcified right hilar and mediastinal lymph nodes, presumably from old granulomatous disease. Esophagus is normal in appearance. Multiple borderline enlarged and mildly enlarged left axillary lymph nodes measuring up to 10 mm in short axis.  Lungs/Pleura: Large calcified granuloma in the posterior aspect of the right lower lobe with adjacent scarring. 12 x 7 mm right upper lobe pulmonary nodule (image 29 of series 506). No acute consolidative airspace disease. No pleural effusions. Diffuse subpleural bullae are noted in the apices of the lungs bilaterally. Small left-sided Bochdalek's hernia.  Musculoskeletal/Soft Tissues: There are no aggressive appearing lytic or blastic lesions noted in the visualized portions of the skeleton.  Review of the MIP images confirms the above findings.  CTA ABDOMEN AND PELVIS FINDINGS  Hepatobiliary: Status post cholecystectomy. 8 mm low attenuation lesion in segment 6 of the liver (image 180 of series 501), too small to characterize, but statistically likely a tiny cyst. No other suspicious appearing hepatic lesions. No intra or extrahepatic biliary ductal dilatation.  Pancreas: Unremarkable.  Spleen: Multiple calcifications in the spleen, and irregular splenic contour, which could relate to remote trauma or prior splenic infarcts.  Adrenals/Urinary Tract: Bilateral kidneys and bilateral adrenal glands are normal in appearance. No hydroureteronephrosis. Urinary bladder is normal in appearance.  Stomach/Bowel: Who normal appearance of the stomach. No pathologic dilatation of small bowel or colon. Normal appendix.  Vascular/Lymphatic: Atherosclerosis throughout the abdominal and pelvic vasculature. Aneurysmal dilatation of the common iliac arteries bilaterally which measure up to 1.6 cm in diameter bilaterally. No evidence of dissection. No lymphadenopathy noted in the abdomen or pelvis.  Reproductive: Prostate gland is enlarged  and heterogeneous in appearance measuring 6.3 x 4.1 cm. Seminal vesicles are unremarkable in appearance.  Other: No significant volume of ascites.  No pneumoperitoneum.  Musculoskeletal: There are no aggressive appearing lytic or blastic lesions noted in the visualized portions of the skeleton.  Review of the MIP images confirms the above findings.  IMPRESSION: 1. Aneurysmal dilatation of the aortic root, which measures up to 5.7 cm in diameter at the level of the sinuses of Valsalva. There is effacement of the sino-tubular junction. This is commonly seen in the setting of Marfan syndrome. No evidence of aortic dissection at this time. There is also some ectasia at of the isthmus of the thoracic aorta which measures up to 4 cm in diameter. 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 2. 12 x 7 mm right upper lobe  pulmonary nodule (image 29 of series 506). Followup evaluation with PET-CT is recommended in the near future to evaluate for potential malignancy. 3. Left main and left anterior descending coronary artery disease. Assessment for potential risk factor modification, dietary therapy or pharmacologic therapy may be warranted, if clinically indicated. 4. Mild cardiomegaly. 35. Sequela of old granulomatous disease, as above. Multiple borderline enlarged and mildly enlarged mediastinal and right hilar lymph nodes that are noncalcified are also noted, as well as borderline enlarged and minimally enlarged left axillary lymph nodes. These are nonspecific, but clinical correlation to exclude the possibility of underlying lymphoproliferative disorder may be appropriate. 6. Additional incidental findings, as above.   Electronically Signed   By: Vinnie Langton M.D.   On: 07/10/2014 21:47   Ct Angio Chest Pe W/cm &/or Wo Cm  07/10/2014 CLINICAL DATA: 74 year old male with increasing shortness of breath for the past 2 weeks, cough,  chest tightness and shortness of breath, which worsens when lying flat. History of atrial fibrillation. History of thoracic aortic aneurysm. EXAM: CT ANGIOGRAPHY CHEST, ABDOMEN AND PELVIS TECHNIQUE: Multidetector CT imaging through the chest, abdomen and pelvis was performed using the standard protocol during bolus administration of intravenous contrast. Multiplanar reconstructed images and MIPs were obtained and reviewed to evaluate the vascular anatomy. CONTRAST: 1103mL OMNIPAQUE IOHEXOL 350 MG/ML SOLN COMPARISON: None. FINDINGS: CTA CHEST FINDINGS Mediastinum/Lymph Nodes: Aneurysmal dilatation of the aortic root which measures 3.2 cm in diameter at the annulus, but 5.7 cm in diameter at the level of the sinuses of Valsalva. The sino-tubular junction is effaced. The thoracic aortic arch is normal in caliber at 2.8 cm. The isthmus of the thoracic aorta is mildly ectatic measuring 4 cm in diameter. The descending thoracic aorta is normal in caliber at 3 cm in diameter. No evidence of thoracic aortic dissection. Heart size is mildly enlarged. There is no significant pericardial fluid, thickening or pericardial calcification. There is atherosclerosis of the thoracic aorta, the great vessels of the mediastinum and the coronary arteries, including calcified atherosclerotic plaque in the left main and left anterior descending coronary arteries. Multiple borderline enlarged and mildly enlarged mediastinal and right hilar lymph nodes, largest of which is in the low right paratracheal nodal station measuring 14 mm in short axis. Multiple densely calcified right hilar and mediastinal lymph nodes, presumably from old granulomatous disease. Esophagus is normal in appearance. Multiple borderline enlarged and mildly enlarged left axillary lymph nodes measuring up to 10 mm in short axis. Lungs/Pleura: Large calcified granuloma in the posterior aspect of the right lower lobe with adjacent scarring. 12 x 7 mm right upper  lobe pulmonary nodule (image 29 of series 506). No acute consolidative airspace disease. No pleural effusions. Diffuse subpleural bullae are noted in the apices of the lungs bilaterally. Small left-sided Bochdalek's hernia. Musculoskeletal/Soft Tissues: There are no aggressive appearing lytic or blastic lesions noted in the visualized portions of the skeleton. Review of the MIP images confirms the above findings. CTA ABDOMEN AND PELVIS FINDINGS Hepatobiliary: Status post cholecystectomy. 8 mm low attenuation lesion in segment 6 of the liver (image 180 of series 501), too small to characterize, but statistically likely a tiny cyst. No other suspicious appearing hepatic lesions. No intra or extrahepatic biliary ductal dilatation. Pancreas: Unremarkable. Spleen: Multiple calcifications in the spleen, and irregular splenic contour, which could relate to remote trauma or prior splenic infarcts. Adrenals/Urinary Tract: Bilateral kidneys and bilateral adrenal glands are normal in appearance. No hydroureteronephrosis. Urinary bladder is normal in appearance. Stomach/Bowel: Who normal  appearance of the stomach. No pathologic dilatation of small bowel or colon. Normal appendix. Vascular/Lymphatic: Atherosclerosis throughout the abdominal and pelvic vasculature. Aneurysmal dilatation of the common iliac arteries bilaterally which measure up to 1.6 cm in diameter bilaterally. No evidence of dissection. No lymphadenopathy noted in the abdomen or pelvis. Reproductive: Prostate gland is enlarged and heterogeneous in appearance measuring 6.3 x 4.1 cm. Seminal vesicles are unremarkable in appearance. Other: No significant volume of ascites. No pneumoperitoneum. Musculoskeletal: There are no aggressive appearing lytic or blastic lesions noted in the visualized portions of the skeleton. Review of the MIP images confirms the above findings. IMPRESSION: 1. Aneurysmal dilatation of the aortic root, which measures up to 5.7  cm in diameter at the level of the sinuses of Valsalva. There is effacement of the sino-tubular junction. This is commonly seen in the setting of Marfan syndrome. No evidence of aortic dissection at this time. There is also some ectasia at of the isthmus of the thoracic aorta which measures up to 4 cm in diameter. 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 2. 12 x 7 mm right upper lobe pulmonary nodule (image 29 of series 506). Followup evaluation with PET-CT is recommended in the near future to evaluate for potential malignancy. 3. Left main and left anterior descending coronary artery disease. Assessment for potential risk factor modification, dietary therapy or pharmacologic therapy may be warranted, if clinically indicated. 4. Mild cardiomegaly. 49. Sequela of old granulomatous disease, as above. Multiple borderline enlarged and mildly enlarged mediastinal and right hilar lymph nodes that are noncalcified are also noted, as well as borderline enlarged and minimally enlarged left axillary lymph nodes. These are nonspecific, but clinical correlation to exclude the possibility of underlying lymphoproliferative disorder may be appropriate. 6. Additional incidental findings, as above. Electronically Signed By: Vinnie Langton M.D. On: 07/10/2014 21:47   Dg Chest Portable 1 View  07/10/2014 CLINICAL DATA: Shortness of breath and chest discomfort. EXAM: PORTABLE CHEST - 1 VIEW COMPARISON: Remote frontal and lateral views 10/23/2007 FINDINGS: The heart is enlarged. Mild prominence of central pulmonary vasculature without pulmonary edema. Lungs remain hyperinflated. No consolidation, pleural effusion, or pneumothorax. Minimal atelectasis or scarring at the left lung base. IMPRESSION: 1. Mild cardiomegaly. 2. Hyperinflation with left basilar atelectasis. Electronically Signed By: Jeb Levering M.D. On:  07/10/2014 02:25    I have independently reviewed the above radiologic studies.  Cardiac Cath:FINDINGS: LV showed LV was moderately enlarged. There was global hypokinesia. EF of approximately 20% to 25%. Left main was very short, which was patent. LAD was patent. Diagonal 1 and 2 were patent. Left circumflex was patent. OM-1 was small, which was patent. OM-2 was moderate sized, which was patent. RCA was patent. PDA and PLV branches were patent.  The patient tolerated the procedure well. There were no complications. The patient was transferred to recovery room in stable condition  PLAN: To maximize beta-blockers and ACE inhibitors. We will start Eliquis tomorrow and possibly discharge tomorrow. The patient will be scheduled for PET scan as an outpatient and open heart surgery for aortic root replacement, possibly aortic valve as an outpatient.   Allegra Lai. Terrence Dupont, M.D.  I have independently reviewed the above cath films and reviewed the findings with the patient .   Recent Lab Findings:  Recent Labs    Lab Results  Component Value Date   WBC 4.8 07/15/2014   HGB 14.4 07/15/2014   HCT 43.4  07/15/2014   PLT 168 07/15/2014   GLUCOSE 113* 07/13/2014   ALT 149* 07/10/2014   AST 79* 07/10/2014   NA 138 07/13/2014   K 4.0 07/13/2014   CL 106 07/13/2014   CREATININE 0.88 07/13/2014   BUN 15 07/13/2014   CO2 24 07/13/2014   INR 1.47 07/14/2014     RecentCardiac CATH: 06/2014  FINDINGS: LV showed LV was moderately enlarged. There was global hypokinesia. EF of approximately 20% to 25%. Left main was very short, which was patent. LAD was patent. Diagonal 1 and 2 were patent. Left circumflex was patent. OM-1 was small, which was patent. OM-2 was moderate sized, which was patent. RCA was patent. PDA and PLV branches were patent.  The Swan findings were RA pressure was 3 mmHg, RV was  26/1, PA was 24/7, wedge pressure was 16. Cardiac output by Fick was 4.10 and by thermodilution it was 4.65.  REPEAT ECHO: 07/2014: ------------------------------------------------------------------- LV EF: 15% -  20%  ------------------------------------------------------------------- Indications:   CHF - 428.0.  ------------------------------------------------------------------- History:  PMH:  Congestive heart failure.  ------------------------------------------------------------------- Study Conclusions  - Left ventricle: The cavity size was moderately dilated. The estimated ejection fraction was in the range of 15% to 20%. Wall motion was normal; there were no regional wall motion abnormalities. - Aortic valve: There was mild regurgitation. - Left atrium: The atrium was mildly dilated. - Atrial septum: No defect or patent foramen ovale was identified. - Pulmonary arteries: Systolic pressure was mildly increased.  Echocardiography. M-mode, complete 2D, spectral Doppler, and color Doppler. Birthdate: Patient birthdate: Feb 02, 1941. Age: Patient is 74 yr old. Sex: Gender: male.  BMI: 22.9 kg/m^2. Blood pressure:   114/72 Patient status: Outpatient. Study date: Study date: 08/07/2014. Study time: 10:02 AM. Location: Echo laboratory.  -------------------------------------------------------------------  ------------------------------------------------------------------- Left ventricle: The cavity size was moderately dilated. The estimated ejection fraction was in the range of 15% to 20%. Wall motion was normal; there were no regional wall motion abnormalities.  ------------------------------------------------------------------- Aortic valve:  Structurally normal valve.  Cusp separation was normal. Doppler: Transvalvular velocity was within the normal range. There was no stenosis. There was mild  regurgitation.  ------------------------------------------------------------------- Aorta: The aorta was moderately dilated. There was moderate dilation of the sinus(es) of Valsalva. Aortic root: The aortic root was moderately dilated. Ascending aorta: The ascending aorta was moderately dilated.  ------------------------------------------------------------------- Mitral valve:  Structurally normal valve.  Leaflet separation was normal. Doppler: Transvalvular velocity was within the normal range. There was no evidence for stenosis. There was trivial regurgitation.  ------------------------------------------------------------------- Left atrium: The atrium was mildly dilated.  ------------------------------------------------------------------- Atrial septum: No defect or patent foramen ovale was identified.  ------------------------------------------------------------------- Right ventricle: The cavity size was normal. Wall thickness was normal. Systolic function was normal.  ------------------------------------------------------------------- Pulmonic valve:  Doppler: There was no significant regurgitation.  ------------------------------------------------------------------- Tricuspid valve:  Structurally normal valve.  Leaflet separation was normal. Doppler: Transvalvular velocity was within the normal range. There was mild regurgitation.  ------------------------------------------------------------------- Pulmonary artery:  Systolic pressure was mildly increased.  ------------------------------------------------------------------- Right atrium: The atrium was normal in size.  ------------------------------------------------------------------- Pericardium: There was no pericardial effusion.  ------------------------------------------------------------------- Post procedure conclusions Ascending Aorta:  - The aorta was moderately  dilated.  ------------------------------------------------------------------- Measurements  Left ventricle              Value    Reference LV ID, ED, PLAX chordal     (H)   70.7 mm   43 - 52 LV ID, ES, PLAX chordal     (  H)   67.5 mm   23 - 38 LV fx shortening, PLAX chordal  (L)   5   %   >=29 LV PW thickness, ED           8.12 mm   --------- IVS/LV PW ratio, ED           1.16     <=1.3 LV e&', lateral              8.49 cm/s  --------- LV E/e&', lateral             6.53     --------- LV e&', medial              6.09 cm/s  --------- LV E/e&', medial             9.1     --------- LV e&', average              7.29 cm/s  --------- LV E/e&', average             7.6     ---------  Ventricular septum            Value    Reference IVS thickness, ED            9.38 mm   ---------  Aortic valve               Value    Reference Aortic regurg pressure half-time     710  ms   ---------  Aorta                  Value    Reference Aortic root ID, ED            49  mm   ---------  Left atrium               Value    Reference LA ID, A-P, ES              44  mm   --------- LA ID/bsa, A-P              2.14 cm/m^2 <=2.2 LA volume, S               93.3 ml   --------- LA volume/bsa, S             45.5 ml/m^2 --------- LA volume, ES, 1-p A4C          74.6 ml   --------- LA volume/bsa, ES, 1-p A4C        36.4 ml/m^2 --------- LA volume, ES, 1-p A2C          107  ml   --------- LA volume/bsa, ES, 1-p A2C        52.2 ml/m^2 ---------  Mitral valve               Value     Reference Mitral E-wave peak velocity       55.4 cm/s  --------- Mitral A-wave peak velocity       39.8 cm/s  --------- Mitral deceleration time     (H)   246  ms   150 - 230 Mitral E/A ratio, peak          1.4     ---------  Pulmonary arteries            Value    Reference PA pressure, S, DP  20  mm Hg <=30  Tricuspid valve             Value    Reference Tricuspid regurg peak velocity      209  cm/s  --------- Tricuspid peak RV-RA gradient      17  mm Hg ---------  Systemic veins              Value    Reference Estimated CVP              3   mm Hg ---------  Right ventricle             Value    Reference RV pressure, S, DP            20  mm Hg <=30 RV s&', lateral, S            6.42 cm/s  ---------  Legend: (L) and (H) mark values outside specified reference range.  ------------------------------------------------------------------- Prepared and Electronically Authenticated by  Charolette Forward, MD 2016-04-08T13:05:59  ------------------------------------------------------------------- PFT's 07/12/2014 21.5 57% DLCO 25.45 67% I does get Response to bronchodilator Airtrapping Mild Diffusion Defect  Aortic Size Index=     5.7    /Body surface area is 2.01 meters squared. 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 No evidence of aortic dissection at this time. Ectasia at of the isthmus of the thoracic aorta  which measures up to 4 cm in diameter.  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 Back in afib controlled rate, follow up echo shows no improvement in LV function  5/Left main and left anterior descending coronary artery calcification on CT but without significant CAD at cath  6/ AFib-  on Elliquis  7/ COPD- Moderately severe Obstructive Airways Disease, Response to bronchodilator, Airtrapping, Mild Diffusion Defect  Recommend:   With depressed lv function out of proportion to CAD or degree of AI suspect af/tachycaria mediated lv dysfunction, but has not improved with follow up echo symptoms of HF  have improved   Has been seen  in HF clinic, ACE increased, another attempt at cardioversion is being arranged.  Aggressive treatment of heart failure before proceeding with root replacement would be of benefit.   Aortic root replacement and poss MAZE if improvement in LV function  Will see back in 6 weeks to see if any progress has been made  I  spent 69minutes counseling the patient face to face and 50% or more the  time was spent in counseling and coordination of care. The total time spent in the appointment was  15 minutes.      Grace Isaac MD      [address] [place] [address] Office [phone]   Beeper [phone]  09/24/2014 1:50 PM

## 2014-09-24 NOTE — Progress Notes (Signed)
Contacted pt and gave results pt is on the lab schedule for next week.

## 2014-09-29 ENCOUNTER — Encounter (HOSPITAL_COMMUNITY): Payer: Self-pay | Admitting: *Deleted

## 2014-09-29 ENCOUNTER — Ambulatory Visit (HOSPITAL_COMMUNITY)
Admission: RE | Admit: 2014-09-29 | Discharge: 2014-09-29 | Disposition: A | Discharge status: Home / Self Care | Payer: Medicare Other | Source: Ambulatory Visit | Attending: Cardiology | Admitting: Cardiology

## 2014-09-29 DIAGNOSIS — I10 Essential (primary) hypertension: Secondary | ICD-10-CM | POA: Diagnosis not present

## 2014-09-29 DIAGNOSIS — I482 Chronic atrial fibrillation: Secondary | ICD-10-CM | POA: Diagnosis not present

## 2014-09-29 DIAGNOSIS — I5022 Chronic systolic (congestive) heart failure: Secondary | ICD-10-CM

## 2014-09-29 DIAGNOSIS — E785 Hyperlipidemia, unspecified: Secondary | ICD-10-CM | POA: Diagnosis not present

## 2014-09-29 DIAGNOSIS — M1990 Unspecified osteoarthritis, unspecified site: Secondary | ICD-10-CM | POA: Diagnosis not present

## 2014-09-29 DIAGNOSIS — I5020 Unspecified systolic (congestive) heart failure: Secondary | ICD-10-CM | POA: Diagnosis not present

## 2014-09-29 LAB — BASIC METABOLIC PANEL
Anion gap: 8 (ref 5–15)
BUN: 23 mg/dL — ABNORMAL HIGH (ref 6–20)
CALCIUM: 9.2 mg/dL (ref 8.9–10.3)
CO2: 23 mmol/L (ref 22–32)
Chloride: 98 mmol/L — ABNORMAL LOW (ref 101–111)
Creatinine, Ser: 1.36 mg/dL — ABNORMAL HIGH (ref 0.61–1.24)
GFR calc Af Amer: 58 mL/min — ABNORMAL LOW (ref >60)
GFR calc non Af Amer: 50 mL/min — ABNORMAL LOW (ref >60)
GLUCOSE: 114 mg/dL — AB (ref 65–99)
POTASSIUM: 4.6 mmol/L (ref 3.5–5.1)
Sodium: 129 mmol/L — ABNORMAL LOW (ref 135–145)

## 2014-09-30 ENCOUNTER — Telehealth (HOSPITAL_COMMUNITY): Payer: Self-pay | Admitting: Cardiology

## 2014-09-30 ENCOUNTER — Other Ambulatory Visit: Payer: Self-pay | Admitting: *Deleted

## 2014-09-30 ENCOUNTER — Other Ambulatory Visit (HOSPITAL_COMMUNITY): Payer: Medicare Other

## 2014-09-30 NOTE — Telephone Encounter (Signed)
Pt scheduled for DCCv with Dr.McLean on 10/01/2014 Cpt code 16384 With pts current insurance-MEDICARE No pre cert required

## 2014-10-01 ENCOUNTER — Ambulatory Visit (HOSPITAL_COMMUNITY): Payer: Medicare Other | Admitting: Certified Registered Nurse Anesthetist

## 2014-10-01 ENCOUNTER — Encounter (HOSPITAL_COMMUNITY): Payer: Self-pay | Admitting: Certified Registered Nurse Anesthetist

## 2014-10-01 ENCOUNTER — Ambulatory Visit (HOSPITAL_COMMUNITY)
Admission: RE | Admit: 2014-10-01 | Discharge: 2014-10-01 | Disposition: A | Discharge status: Home / Self Care | Payer: Medicare Other | Source: Ambulatory Visit | Attending: Cardiology | Admitting: Cardiology

## 2014-10-01 ENCOUNTER — Encounter (HOSPITAL_COMMUNITY)
Admission: RE | Disposition: A | Discharge status: Home / Self Care | Payer: Self-pay | Source: Ambulatory Visit | Attending: Cardiology

## 2014-10-01 DIAGNOSIS — I10 Essential (primary) hypertension: Secondary | ICD-10-CM | POA: Diagnosis not present

## 2014-10-01 DIAGNOSIS — I719 Aortic aneurysm of unspecified site, without rupture: Secondary | ICD-10-CM | POA: Diagnosis not present

## 2014-10-01 DIAGNOSIS — J449 Chronic obstructive pulmonary disease, unspecified: Secondary | ICD-10-CM | POA: Diagnosis not present

## 2014-10-01 DIAGNOSIS — M1990 Unspecified osteoarthritis, unspecified site: Secondary | ICD-10-CM | POA: Diagnosis not present

## 2014-10-01 DIAGNOSIS — I481 Persistent atrial fibrillation: Secondary | ICD-10-CM | POA: Insufficient documentation

## 2014-10-01 DIAGNOSIS — Z79899 Other long term (current) drug therapy: Secondary | ICD-10-CM | POA: Insufficient documentation

## 2014-10-01 DIAGNOSIS — I351 Nonrheumatic aortic (valve) insufficiency: Secondary | ICD-10-CM | POA: Insufficient documentation

## 2014-10-01 DIAGNOSIS — I4891 Unspecified atrial fibrillation: Secondary | ICD-10-CM | POA: Diagnosis not present

## 2014-10-01 DIAGNOSIS — I429 Cardiomyopathy, unspecified: Secondary | ICD-10-CM | POA: Diagnosis not present

## 2014-10-01 HISTORY — PX: CARDIOVERSION: SHX1299

## 2014-10-01 SURGERY — CARDIOVERSION
Anesthesia: Monitor Anesthesia Care

## 2014-10-01 MED ORDER — LIDOCAINE HCL (CARDIAC) 20 MG/ML IV SOLN
INTRAVENOUS | Status: DC | PRN
  Administered 2014-10-01: 60 mg via INTRAVENOUS

## 2014-10-01 MED ORDER — SODIUM CHLORIDE 0.9 % IV SOLN
INTRAVENOUS | Status: DC
  Administered 2014-10-01: 500 mL via INTRAVENOUS
  Administered 2014-10-01 (×2): via INTRAVENOUS

## 2014-10-01 MED ORDER — PROPOFOL 10 MG/ML IV BOLUS
INTRAVENOUS | Status: DC | PRN
  Administered 2014-10-01: 30 mg via INTRAVENOUS
  Administered 2014-10-01: 100 mg via INTRAVENOUS

## 2014-10-01 NOTE — Anesthesia Postprocedure Evaluation (Signed)
  Anesthesia Post-op Note  Patient: [name]  Procedure(s) Performed: Procedure(s): CARDIOVERSION (N/A)  Patient Location: PACU  Anesthesia Type:General  Level of Consciousness: awake and alert   Airway and Oxygen Therapy: Patient Spontanous Breathing  Post-op Pain: none  Post-op Assessment: Post-op Vital signs reviewed  Post-op Vital Signs: Reviewed  Last Vitals:  Filed Vitals:   10/01/14 1324  BP: 115/52  Pulse: 65  Temp: 36.6 C  Resp: 13    Complications: No apparent anesthesia complications

## 2014-10-01 NOTE — Interval H&P Note (Signed)
History and Physical Interval Note:  10/01/2014 12:49 PM  [name]  has presented today for surgery, with the diagnosis of afib  The various methods of treatment have been discussed with the patient and family. After consideration of risks, benefits and other options for treatment, the patient has consented to  Procedure(s): CARDIOVERSION (N/A) as a surgical intervention .  The patient's history has been reviewed, patient examined, no change in status, stable for surgery.  I have reviewed the patient's chart and labs.  Questions were answered to the patient's satisfaction.     Nicko Daher [employer]

## 2014-10-01 NOTE — Transfer of Care (Signed)
Immediate Anesthesia Transfer of Care Note  Patient: [name]  Procedure(s) Performed: Procedure(s): CARDIOVERSION (N/A)  Patient Location: PACU and Endoscopy Unit  Anesthesia Type:MAC  Level of Consciousness: awake, alert , oriented and patient cooperative  Airway & Oxygen Therapy: Patient Spontanous Breathing and Patient connected to nasal cannula oxygen  Post-op Assessment: Report given to RN, Post -op Vital signs reviewed and stable and Patient moving all extremities  Post vital signs: Reviewed and stable  Complications: No apparent anesthesia complications

## 2014-10-01 NOTE — Anesthesia Preprocedure Evaluation (Signed)
Anesthesia Evaluation  Patient identified by MRN, date of birth, ID band Patient awake    Reviewed: Allergy & Precautions, NPO status , Patient's Chart, lab work & pertinent test results  Airway Mallampati: II  TM Distance: >3 FB Neck ROM: Full    Dental no notable dental hx.    Pulmonary asthma , COPD COPD inhaler,  breath sounds clear to auscultation  Pulmonary exam normal       Cardiovascular hypertension, Pt. on medications + dysrhythmias Atrial Fibrillation Rhythm:Irregular Rate:Normal + Systolic murmurs Aortic root aneurysm   Neuro/Psych negative neurological ROS  negative psych ROS   GI/Hepatic negative GI ROS, Neg liver ROS,   Endo/Other  negative endocrine ROS  Renal/GU negative Renal ROS  negative genitourinary   Musculoskeletal negative musculoskeletal ROS (+)   Abdominal   Peds negative pediatric ROS (+)  Hematology negative hematology ROS (+)   Anesthesia Other Findings   Reproductive/Obstetrics negative OB ROS                             Anesthesia Physical Anesthesia Plan  ASA: III  Anesthesia Plan: MAC   Post-op Pain Management:    Induction: Intravenous  Airway Management Planned: Mask  Additional Equipment:   Intra-op Plan:   Post-operative Plan:   Informed Consent: I have reviewed the patients History and Physical, chart, labs and discussed the procedure including the risks, benefits and alternatives for the proposed anesthesia with the patient or authorized representative who has indicated his/her understanding and acceptance.   Dental advisory given  Plan Discussed with: CRNA and Surgeon  Anesthesia Plan Comments:         Anesthesia Quick Evaluation

## 2014-10-01 NOTE — H&P (View-Only) (Signed)
Patient ID: [name], male   DOB: 1941-04-04, 74 y.o.   MRN: [id number]  74 yo with history of HTN and COPD was admitted in 3/16 with atrial fibrillation/RVR and dyspnea.  Echo showed low EF (15-20% range).  He had TEE-guided DCCV to NSR and cardiac catheterization.  Cath showed no obstructive coronary disease (nonischemic cardiomyopathy).  Echo showed dilated aortic root.  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  He has seen Dr Servando Snare for evaluation for aortic root replacement.   Patient has not felt palpitations but is back in atrial fibrillation today.  He is watching his sodium intake.  Weight is trending down.  He has occasional fatigue with heavy exertion but denies significant dyspnea.  He walks for exercise.  No chest pain.  No falls/syncope/lightheadedness.  No orthopnea/PND.  No family history of dissection or aneurysms.  Aortic valve is trileaflet.    He returns for follow up. Last visit amio was increased as well as carvedilol was increased. Dizzy this morning. Able to walk 1 mile in 20 minutes. Denies SOB/PND/Orthopnea. Taking all medications. Taking Eliquis. Not bleeding at all.  Appetite fair.  He was orthostatic when checked in office today. He remains in atria fibrillation.   Labs (3/16): HCT 43.4, K 4, creatinine 0.88 Labs (09/08/2014): K 5.1 Creatinine 1.12 Hgb 15.0   PMH: 1. HTN 2. Hyperlipidemia 3. Osteoarthritis 4. Persistent atrial fibrillation: Atrial fibrillation first noted in 3/16.  TEE-guided DCCV in 3/16.  Noted to be back in atrial fibrillation 5/16 CHF appt.  5. Right upper lobe lung nodule: PET negative.  6. COPD: PFTs (3/16) with FVC 75%, FEV1 57%, ratio 75%, TLC 101%, DLCO 67%.  Moderate to severe obstructive disease.  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  CTA chest (3/16) with 5.7 cm aortic root with effacement of STJ, no dissection. The aortic valve is  trileaflet.  8. Nonischemic cardiomyopathy: Echo (4/16) with EF 15-20%, moderate LV dilation, mild AI.  RHC/LHC (3/16) with mean RA 3, PA 24/7, mean PCWP 16, CI 4.1, EF 20-25%, no significant CAD.   9. 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  Moderate by TEE in 3/16, mild by TTE in 4/16.   SH: Separated from wife, lives in [place], has not smoked for many years but has been around second hand smoke a lot, no ETOH.  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   FH: Father with MI at 45, mother with "heart trouble."   ROS: All systems reviewed and negative except as per HPI.   Current Outpatient Prescriptions  Medication Sig Dispense Refill  . ADVAIR DISKUS 250-50 MCG/DOSE AEPB Inhale 1 puff into the lungs 2 (two) times daily.     Marland Kitchen ALPRAZolam (XANAX) 0.25 MG tablet at bedtime as needed.     Marland Kitchen amiodarone (PACERONE) 200 MG tablet Take 1 tablet (200 mg total) by mouth daily. 30 tablet 3  . apixaban (ELIQUIS) 5 MG TABS tablet Take 5 mg by mouth 2 (two) times daily.    Marland Kitchen atorvastatin (LIPITOR) 20 MG tablet Take 20 mg by mouth daily at 6 PM.     . carvedilol (COREG) 6.25 MG tablet Take 1.5 tablets (9.375 mg total) by mouth 2 (two) times daily with a meal. 90 tablet 3  . lisinopril (PRINIVIL,ZESTRIL) 5 MG tablet Take 1 tablet (5 mg total) by mouth 2 (two)  times daily. 60 tablet 3  . Multiple Vitamin (MULTIVITAMIN) tablet Take 1 tablet by mouth daily.    Marland Kitchen PROAIR HFA 108 (90 BASE) MCG/ACT inhaler Inhale 1 puff into the lungs every 4 (four) hours as needed for wheezing or shortness of breath.     . SPIRIVA HANDIHALER 18 MCG inhalation capsule Place 18 mcg into inhaler and inhale daily.     Marland Kitchen spironolactone (ALDACTONE) 25 MG tablet Take 25 mg by mouth daily.      No current facility-administered medications for this encounter.   BP 90/64 mmHg  Pulse 78  Resp 18  Wt 168 lb 8 oz (76.431 kg)  SpO2 98%   Standing SBP 78/48 General: NAD Neck: No  JVD flat , no thyromegaly or thyroid nodule.  Lungs: Mildly prolonged expiratory phase.  CV: Distant heart sounds.  Heart regular S1/S2, no S3/S4, no murmur.  No peripheral edema.  No carotid bruit.  Normal pedal pulses.  Abdomen: Soft, nontender, no hepatosplenomegaly, no distention.  Skin: Intact without lesions or rashes.  Neurologic: Alert and oriented x 3.  Psych: Normal affect. Extremities: No clubbing or cyanosis.  HEENT: Normal.  MSK: Joints not hypermobile.   EKG- A fib 68 bpm   Assessment/Plan: 1. Chronic systolic CHF: Nonischemic cardiomyopathy.  4/16 echo with EF 15-20%.  Possible tachycardia-mediated cardiomyopathy but no improvement with NSR/rate control when echo was done in 4/16.  Coronary angiography 3/16 without significant CAD.  TEE in 3/16 showed moderate AI and echo 4/16 showed mild AI, so doubt AI contributes significantly to cardiomyopathy. Volume status low. Orthostatic today.  Appears dry. Give 500 cc NS.   - Continue Coreg 9.375 mg bid and lisinopril 5 mg bid.   - Stop spironolactone.   - Will obtain cardiac MRI after 3 months of medical treatment (8/16).  This will allow [place] to look for any evidence for myocarditis or infiltrative disease.  - Will need to send SPEP/UPEP for completeness.  2. Atrial fibrillation: Persistent.  First identified in 3/16.  He is now on Eliquis and amiodarone 200 mg daily.  He had DCCV in 3/16 but has subsequently gone back into atrial fibrillation.  Rate is controlled currently.  Given cardiomyopathy, would be reasonable to get him out of atrial fibrillation.   - Continue amiodarone 200 mg bid.  LFTs normal recently but TSH mildly elevated.  Will need to recheck TSH with free T3 and free T4.  - Will need DCCV. Has been taking eliquis. TEE would not be needed as long as he does not miss doses of Eliquis.  Arrange for next week.  3. Aortic root dilation: 5.7 cm aortic root with STJ effacement on CTA chest.  No dissection.  Cause uncertain.  Aortic valve is trileaflet, BP is controlled.  He is relatively tall and thin, but not marfanoid, per se.  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  Aortic root size large enough to warrant prophylactic repair, however would like to aggressively treat his cardiomyopathy prior to taking him for surgery.  4. 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  Moderate by 3/16 TEE, mild by 4/16 TTE.   5. COPD: Moderately severe obstructive lung disease.  Has not smoked for years.  Had second hand smoke exposure. Would like to have him evaluated by pulmonary prior to surgery.   Follow up in 2 weeks.   CLEGG,AMY NP-C  09/22/2014   Patient seen with NP, agree with the above note.  He is orthostatic today, hypovolemic.  Will stop  spironolactone and will give him IV fluid in the office today.  Check BMET today.  Will continue his other meds as they have been ordered unless creatinine is significantly elevated (in that case, will cut back on lisinopril).    He is still in atrial fibrillation, will arrange for DCCV next week.  He has missed no Eliquis doses.   [name] 09/22/2014 4:52 PM

## 2014-10-01 NOTE — Discharge Instructions (Signed)
Monitored Anesthesia Care Monitored anesthesia care is an anesthesia service for a medical procedure. Anesthesia is the loss of the ability to feel pain. It is produced by medicines called anesthetics. It may affect a small area of your body (local anesthesia), a large area of your body (regional anesthesia), or your entire body (general anesthesia). The need for monitored anesthesia care depends your procedure, your condition, and the potential need for regional or general anesthesia. It is often provided during procedures where:   General anesthesia may be needed if there are complications. This is because you need special care when you are under general anesthesia.   You will be under local or regional anesthesia. This is so that you are able to have higher levels of anesthesia if needed.   You will receive calming medicines (sedatives). This is especially the case if sedatives are given to put you in a semi-conscious state of relaxation (deep sedation). This is because the amount of sedative needed to produce this state can be hard to predict. Too much of a sedative can produce general anesthesia. Monitored anesthesia care is performed by one or more health care providers who have special training in all types of anesthesia. You will need to meet with these health care providers before your procedure. During this meeting, they will ask you about your medical history. They will also give you instructions to follow. (For example, you will need to stop eating and drinking before your procedure. You may also need to stop or change medicines you are taking.) During your procedure, your health care providers will stay with you. They will:   Watch your condition. This includes watching your blood pressure, breathing, and level of pain.   Diagnose and treat problems that occur.   Give medicines if they are needed. These may include calming medicines (sedatives) and anesthetics.   Make sure you are  comfortable.  Having monitored anesthesia care does not necessarily mean that you will be under anesthesia. It does mean that your health care providers will be able to manage anesthesia if you need it or if it occurs. It also means that you will be able to have a different type of anesthesia than you are having if you need it. When your procedure is complete, your health care providers will continue to watch your condition. They will make sure any medicines wear off before you are allowed to go home.  Document Released: 01/11/2005 Document Revised: 09/01/2013 Document Reviewed: 05/29/2012 [facility] Patient Information 2015 [place], [place]. This information is not intended to replace advice given to you by your health care provider. Make sure you discuss any questions you have with your health care provider. Electrical Cardioversion Electrical cardioversion is the delivery of a jolt of electricity to change the rhythm of the heart. Sticky patches or metal paddles are placed on the chest to deliver the electricity from a device. This is done to restore a normal rhythm. A rhythm that is too fast or not regular keeps the heart from pumping well. Electrical cardioversion is done in an emergency if:   There is low or no blood pressure as a result of the heart rhythm.   Normal rhythm must be restored as fast as possible to protect the brain and heart from further damage.   It may save a life. Cardioversion may be done for heart rhythms that are not immediately life threatening, such as atrial fibrillation or flutter, in which:   The heart is beating too fast or  is not regular.   Medicine to change the rhythm has not worked.   It is safe to wait in order to allow time for preparation.  Symptoms of the abnormal rhythm are bothersome.  The risk of stroke and other serious problems can be reduced. LET [facility] CARE PROVIDER KNOW ABOUT:   Any allergies you have.  All medicines you are taking,  including vitamins, herbs, eye drops, creams, and over-the-counter medicines.  Previous problems you or members of your family have had with the use of anesthetics.   Any blood disorders you have.   Previous surgeries you have had.   Medical conditions you have. RISKS AND COMPLICATIONS  Generally, this is a safe procedure. However, problems can occur and include:   Breathing problems related to the anesthetic used.  A blood clot that breaks free and travels to other parts of your body. This could cause a stroke or other problems. The risk of this is lowered by use of blood-thinning medicine (anticoagulant) prior to the procedure.  Cardiac arrest (rare). BEFORE THE PROCEDURE   You may have tests to detect blood clots in your heart and to evaluate heart function.  You may start taking anticoagulants so your blood does not clot as easily.   Medicines may be given to help stabilize your heart rate and rhythm. PROCEDURE  You will be given medicine through an IV tube to reduce discomfort and make you sleepy (sedative).   An electrical shock will be delivered. AFTER THE PROCEDURE Your heart rhythm will be watched to make sure it does not change.  Document Released: 04/07/2002 Document Revised: 09/01/2013 Document Reviewed: 10/30/2012 [facility] Patient Information 2015 [place], [place]. This information is not intended to replace advice given to you by your health care provider. Make sure you discuss any questions you have with your health care provider.

## 2014-10-01 NOTE — Procedures (Signed)
Electrical Cardioversion Procedure Note [name] [id number] Oct 01, 1940  Procedure: Electrical Cardioversion Indications:  Atrial Fibrillation  Procedure Details Consent: Risks of procedure as well as the alternatives and risks of each were explained to the (patient/caregiver).  Consent for procedure obtained. Time Out: Verified patient identification, verified procedure, site/side was marked, verified correct patient position, special equipment/implants available, medications/allergies/relevent history reviewed, required imaging and test results available.  Performed  Patient placed on cardiac monitor, pulse oximetry, supplemental oxygen as necessary.  Sedation given: Propofol Pacer pads placed anterior and posterior chest.  Cardioverted 4 time(s) with sternal pressure. Cardioverted at [place].  Evaluation Findings: Post procedure EKG shows: Atrial fibrillation. Unsuccessful cardioversion.  Complications: None Patient did tolerate procedure well.  I will leave him on amiodarone until his followup appt, but if he remains in atrial fibrillation at that time, would take him off amiodarone.    [name] 10/01/2014, 1:02 PM

## 2014-10-05 ENCOUNTER — Encounter (HOSPITAL_COMMUNITY): Payer: Self-pay | Admitting: Cardiology

## 2014-10-09 ENCOUNTER — Telehealth (HOSPITAL_COMMUNITY): Payer: Self-pay | Admitting: Vascular Surgery

## 2014-10-09 NOTE — Telephone Encounter (Signed)
Left message about Pulm appt w/ Mcquaid 3:30

## 2014-10-09 NOTE — Telephone Encounter (Signed)
Left pt a message about his pulm appt w/ Mcquaid 10/27/14 @3 :30

## 2014-10-27 ENCOUNTER — Institutional Professional Consult (permissible substitution): Payer: Medicare Other | Admitting: Pulmonary Disease

## 2014-11-04 ENCOUNTER — Ambulatory Visit (HOSPITAL_COMMUNITY)
Admission: RE | Admit: 2014-11-04 | Discharge: 2014-11-04 | Disposition: A | Discharge status: Home / Self Care | Payer: Medicare Other | Source: Ambulatory Visit | Attending: Cardiology | Admitting: Cardiology

## 2014-11-04 VITALS — BP 108/50 | HR 56 | Wt 169.2 lb

## 2014-11-04 DIAGNOSIS — I440 Atrioventricular block, first degree: Secondary | ICD-10-CM | POA: Insufficient documentation

## 2014-11-04 DIAGNOSIS — I429 Cardiomyopathy, unspecified: Secondary | ICD-10-CM | POA: Insufficient documentation

## 2014-11-04 DIAGNOSIS — J449 Chronic obstructive pulmonary disease, unspecified: Secondary | ICD-10-CM | POA: Insufficient documentation

## 2014-11-04 DIAGNOSIS — Z87891 Personal history of nicotine dependence: Secondary | ICD-10-CM | POA: Insufficient documentation

## 2014-11-04 DIAGNOSIS — I4819 Other persistent atrial fibrillation: Secondary | ICD-10-CM

## 2014-11-04 DIAGNOSIS — Z7902 Long term (current) use of antithrombotics/antiplatelets: Secondary | ICD-10-CM | POA: Insufficient documentation

## 2014-11-04 DIAGNOSIS — I4510 Unspecified right bundle-branch block: Secondary | ICD-10-CM | POA: Diagnosis not present

## 2014-11-04 DIAGNOSIS — E785 Hyperlipidemia, unspecified: Secondary | ICD-10-CM | POA: Diagnosis not present

## 2014-11-04 DIAGNOSIS — Z79899 Other long term (current) drug therapy: Secondary | ICD-10-CM | POA: Insufficient documentation

## 2014-11-04 DIAGNOSIS — I480 Paroxysmal atrial fibrillation: Secondary | ICD-10-CM | POA: Insufficient documentation

## 2014-11-04 DIAGNOSIS — I719 Aortic aneurysm of unspecified site, without rupture: Secondary | ICD-10-CM

## 2014-11-04 DIAGNOSIS — I481 Persistent atrial fibrillation: Secondary | ICD-10-CM | POA: Diagnosis not present

## 2014-11-04 DIAGNOSIS — I351 Nonrheumatic aortic (valve) insufficiency: Secondary | ICD-10-CM | POA: Insufficient documentation

## 2014-11-04 DIAGNOSIS — J438 Other emphysema: Secondary | ICD-10-CM | POA: Diagnosis not present

## 2014-11-04 DIAGNOSIS — Z8249 Family history of ischemic heart disease and other diseases of the circulatory system: Secondary | ICD-10-CM | POA: Insufficient documentation

## 2014-11-04 DIAGNOSIS — M1990 Unspecified osteoarthritis, unspecified site: Secondary | ICD-10-CM | POA: Diagnosis not present

## 2014-11-04 DIAGNOSIS — I10 Essential (primary) hypertension: Secondary | ICD-10-CM | POA: Insufficient documentation

## 2014-11-04 DIAGNOSIS — I7121 Aneurysm of the ascending aorta, without rupture: Secondary | ICD-10-CM

## 2014-11-04 DIAGNOSIS — I5022 Chronic systolic (congestive) heart failure: Secondary | ICD-10-CM | POA: Insufficient documentation

## 2014-11-04 LAB — CBC
HEMATOCRIT: 35.9 % — AB (ref 39.0–52.0)
HEMOGLOBIN: 11.9 g/dL — AB (ref 13.0–17.0)
MCH: 30.2 pg (ref 26.0–34.0)
MCHC: 33.1 g/dL (ref 30.0–36.0)
MCV: 91.1 fL (ref 78.0–100.0)
Platelets: 176 10*3/uL (ref 150–400)
RBC: 3.94 MIL/uL — ABNORMAL LOW (ref 4.22–5.81)
RDW: 14.7 % (ref 11.5–15.5)
WBC: 5.1 10*3/uL (ref 4.0–10.5)

## 2014-11-04 LAB — COMPREHENSIVE METABOLIC PANEL
ALBUMIN: 3.8 g/dL (ref 3.5–5.0)
ALK PHOS: 76 U/L (ref 38–126)
ALT: 32 U/L (ref 17–63)
AST: 30 U/L (ref 15–41)
Anion gap: 7 (ref 5–15)
BILIRUBIN TOTAL: 0.9 mg/dL (ref 0.3–1.2)
BUN: 12 mg/dL (ref 6–20)
CHLORIDE: 104 mmol/L (ref 101–111)
CO2: 25 mmol/L (ref 22–32)
CREATININE: 0.94 mg/dL (ref 0.61–1.24)
Calcium: 9 mg/dL (ref 8.9–10.3)
GFR calc Af Amer: >60 mL/min (ref >60)
GFR calc non Af Amer: >60 mL/min (ref >60)
GLUCOSE: 176 mg/dL — AB (ref 65–99)
POTASSIUM: 4.1 mmol/L (ref 3.5–5.1)
Sodium: 136 mmol/L (ref 135–145)
Total Protein: 6.5 g/dL (ref 6.5–8.1)

## 2014-11-04 LAB — TSH: TSH: 2.225 u[IU]/mL (ref 0.350–4.500)

## 2014-11-04 LAB — T4, FREE: FREE T4: 1 ng/dL (ref 0.61–1.12)

## 2014-11-04 MED ORDER — SPIRONOLACTONE 25 MG PO TABS
12.5000 mg | ORAL_TABLET | Freq: Every day | ORAL | Status: DC
Start: 2014-11-04 — End: 2015-01-28

## 2014-11-04 NOTE — Progress Notes (Signed)
Patient ID: [name], male   DOB: [birth year], 74 y.o.   MRN: [id number]  74 yo with history of HTN and COPD was admitted in 3/16 with atrial fibrillation/RVR and dyspnea.  Echo showed low EF (15-20% range).  He had TEE-guided DCCV to NSR and cardiac catheterization.  Cath showed no obstructive coronary disease (nonischemic cardiomyopathy).  Echo showed dilated aortic root.  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  He has seen Dr Servando Snare for evaluation for aortic root replacement.   At initial appointment, patient was noted to be back in atrial fibrillation.  He was put on amiodarone and I took him for DCCV in 6/16.  DCCV was unsuccessful, but I left him on amiodarone for the time being.  He is actually back in NSR today.    He is watching his sodium intake.  Weight is stable.  He has occasional fatigue with heavy exertion but denies significant dyspnea.  He walks for exercise up to a mile.  No chest pain.  No falls/syncope/lightheadedness.  No orthopnea/PND.  No family history of dissection or aneurysms.  Aortic valve is trileaflet.  No melena or BRBPR on apixaban. No lightheadedness.   Labs (3/16): HCT 43.4, K 4, creatinine 0.88 Labs (08/2014): K 5.1 => 4.6, Creatinine 1.12 => 1.36, Hgb 15.0   ECG: NSR (low voltage Ps), RBBB, LAFB  PMH: 1. HTN 2. Hyperlipidemia 3. Osteoarthritis 4. Persistent atrial fibrillation: Atrial fibrillation first noted in 3/16.  TEE-guided DCCV in 3/16.  Noted to be back in atrial fibrillation 5/16 CHF appt. DCCV 6/16 was unsuccessful but he was in NSR at 7/16 appt.  5. Right upper lobe lung nodule: PET negative.  6. COPD: PFTs (3/16) with FVC 75%, FEV1 57%, ratio 75%, TLC 101%, DLCO 67%.  Moderate to severe obstructive disease.  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  CTA chest (3/16) with 5.7 cm aortic root with effacement of STJ, no dissection. The aortic valve is  trileaflet.  8. Nonischemic cardiomyopathy: Echo (4/16) with EF 15-20%, moderate LV dilation, mild AI.  RHC/LHC (3/16) with mean RA 3, PA 24/7, mean PCWP 16, CI 4.1, EF 20-25%, no significant CAD.   9. 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  Moderate by TEE in 3/16, mild by TTE in 4/16.   SH: Separated from wife, lives in [place], has not smoked for many years but has been around second hand smoke a lot, no ETOH.  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   FH: Father with MI at 53, mother with "heart trouble."   ROS: All systems reviewed and negative except as per HPI.   Current Outpatient Prescriptions  Medication Sig Dispense Refill  . ADVAIR DISKUS 250-50 MCG/DOSE AEPB Inhale 1 puff into the lungs 2 (two) times daily.     Marland Kitchen ALPRAZolam (XANAX) 0.25 MG tablet Take 0.25 mg by mouth at bedtime as needed for anxiety or sleep.     Marland Kitchen amiodarone (PACERONE) 200 MG tablet Take 1 tablet (200 mg total) by mouth daily. 30 tablet 3  . apixaban (ELIQUIS) 5 MG TABS tablet Take 5 mg by mouth 2 (two) times daily.    Marland Kitchen atorvastatin (LIPITOR) 20 MG tablet Take 20 mg by mouth daily at 6 PM.     . carvedilol (COREG) 6.25 MG tablet Take 1.5 tablets (9.375 mg total) by mouth 2 (two)  times daily with a meal. 90 tablet 3  . lisinopril (PRINIVIL,ZESTRIL) 5 MG tablet Take 1 tablet (5 mg total) by mouth 2 (two) times daily. 60 tablet 3  . Multiple Vitamin (MULTIVITAMIN) tablet Take 1 tablet by mouth daily.    Marland Kitchen PROAIR HFA 108 (90 BASE) MCG/ACT inhaler Inhale 1 puff into the lungs every 4 (four) hours as needed for wheezing or shortness of breath.     . SPIRIVA HANDIHALER 18 MCG inhalation capsule Place 18 mcg into inhaler and inhale daily.     Marland Kitchen spironolactone (ALDACTONE) 25 MG tablet Take 0.5 tablets (12.5 mg total) by mouth daily. 15 tablet 3   No current facility-administered medications for this encounter.   BP 108/50 mmHg  Pulse 56  Wt 169 lb 4 oz  (76.771 kg)  SpO2 98%  General: NAD Neck: No JVD flat , no thyromegaly or thyroid nodule.  Lungs: Mildly prolonged expiratory phase.  CV: Distant heart sounds.  Heart regular S1/S2, no S3/S4, no murmur.  No peripheral edema.  No carotid bruit.  Normal pedal pulses.  Abdomen: Soft, nontender, no hepatosplenomegaly, no distention.  Skin: Intact without lesions or rashes.  Neurologic: Alert and oriented x 3.  Psych: Normal affect. Extremities: No clubbing or cyanosis.  HEENT: Normal.  MSK: Joints not hypermobile.   Assessment/Plan: 1. Chronic systolic CHF: Nonischemic cardiomyopathy.  4/16 echo with EF 15-20%.  Possible tachycardia-mediated cardiomyopathy but no improvement with NSR/rate control when echo was done in 4/16.  Coronary angiography 3/16 without significant CAD.  TEE in 3/16 showed moderate AI and echo 4/16 showed mild AI, so doubt AI contributes significantly to cardiomyopathy. Euvolemic on exam. - Continue Coreg 9.375 mg bid and lisinopril 5 mg bid.   - Add back spironolactone 12.5 mg daily with BMET today and again in 10 days.    - Will obtain cardiac MRI/MRA chest in 9/16.  This will allow [place] to look for any evidence for myocarditis or infiltrative disease.  If EF remains low, will need ICD.  Not candidate for CRT with RBBB.  - Need to send SPEP/UPEP for completeness.  2. Atrial fibrillation: Paroxysmal.  First identified in 3/16.  He is now on Eliquis and amiodarone 200 mg daily.  He had DCCV in 3/16 but subsequently went back into atrial fibrillation.  DCCV in 6/16 failed but back in NSR by today's ECG.   - Continue amiodarone 200 mg daily.  Check LFTs, free T3, free T4, TSH today.  He should have a regular eye exam. - Continue Eliquis, check CBC today.  3. Aortic root dilation: 5.7 cm aortic root with STJ effacement on CTA chest.  No dissection.  Cause uncertain. Aortic valve is trileaflet, BP is controlled.  He is relatively tall and thin, but not marfanoid, per se.  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  Aortic root size large enough to warrant prophylactic repair, however would like to aggressively treat his cardiomyopathy prior to taking him for surgery.  Will get MRA chest to reassess aorta with cardiac MRI in 9/16.  4. 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  Moderate by 3/16 TEE, mild by 4/16 TTE.   5. COPD: Moderately severe obstructive lung disease.  Has not smoked for years.  Had second hand smoke exposure. Would like to have him evaluated by pulmonary prior to surgery.  He has pulmonary appt scheduled.   [name] 11/04/2014

## 2014-11-04 NOTE — Patient Instructions (Signed)
Start Spironolactone 12.5 mg (1/2 tab) daily  Labs today  Labs in 10 days  Cardiac MRI, we have to get this approved by your insurance before we can schedule, once approved we will contact you to schedule  Your physician recommends that you schedule a follow-up appointment in: 1 month

## 2014-11-05 ENCOUNTER — Ambulatory Visit (INDEPENDENT_AMBULATORY_CARE_PROVIDER_SITE_OTHER): Payer: Medicare Other | Admitting: Cardiothoracic Surgery

## 2014-11-05 ENCOUNTER — Encounter: Payer: Self-pay | Admitting: Cardiothoracic Surgery

## 2014-11-05 VITALS — BP 127/61 | HR 46 | Resp 16 | Ht 74.0 in | Wt 165.0 lb

## 2014-11-05 DIAGNOSIS — I719 Aortic aneurysm of unspecified site, without rupture: Secondary | ICD-10-CM

## 2014-11-05 DIAGNOSIS — I429 Cardiomyopathy, unspecified: Secondary | ICD-10-CM | POA: Diagnosis not present

## 2014-11-05 DIAGNOSIS — I2510 Atherosclerotic heart disease of native coronary artery without angina pectoris: Secondary | ICD-10-CM

## 2014-11-05 DIAGNOSIS — I351 Nonrheumatic aortic (valve) insufficiency: Secondary | ICD-10-CM | POA: Diagnosis not present

## 2014-11-05 DIAGNOSIS — I428 Other cardiomyopathies: Secondary | ICD-10-CM

## 2014-11-05 DIAGNOSIS — R911 Solitary pulmonary nodule: Secondary | ICD-10-CM

## 2014-11-05 DIAGNOSIS — I7121 Aneurysm of the ascending aorta, without rupture: Secondary | ICD-10-CM

## 2014-11-05 LAB — T3, FREE: T3, Free: 2.4 pg/mL (ref 2.0–4.4)

## 2014-11-05 NOTE — Progress Notes (Signed)
[address]       [place] [address]             [phone]                    Joseph F [place] Medical Record #[id number] Date of Birth: [birth year]  Referring: Charolette Forward, MD Primary Care: Charolette Forward, MD Heart Failure Clinic: Dr Aundra Dubin  Chief Complaint:    Chief Complaint  Patient presents with  . Follow-up    6 wk f/u for further discussion   Patient is 74 year old male withhistory significant for mild coronary artery disease cath 2007, hypertension, prediabetic, hypercholesterolemia, degenerative joint disease, COPD, chronic atrial fibrillation, treated with rate control and anticoagulants. He was seen in the [facility] ER a few days before admission to [facility] in March 2016, had chest x-ray which was negative showed signs of COPD and was started yesterday on Z-Pak without much improvement. He came to the [facility] ED for further evaluation complaining of progressive increasing shortness of breath associated with coughing for approximately 2 weeks. Patient was noted to be in A. fib with moderate ventricular response and  CHF. Patient received IV Lasix with improvement in his symptoms.  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 Cardiac catheterization and TEE were performed. Echocardiogram revealed ejection fraction of 10-15%. Because of this reason we have delayed on proceeding with prophylactic repair of his aortic root and ascending aorta.   Symptomatically his heart failure symptoms have continued to improve over the past 6 weeks. He is followed closely in the heart failure clinic. He denies shortness of breath or pedal edema or orthopnea.  Past Medical History  Diagnosis Date  . A-fib   . Hypertension   . Asthma   . CHF (congestive heart failure)   History of GI Bleed In 1987  Previous history of bilaterial knee replacement, cholecystectomy, inguinal hernias  repairs  Past Surgical History  Procedure Laterality Date  . Tee without cardioversion N/A 07/13/2014    Procedure: TRANSESOPHAGEAL ECHOCARDIOGRAM (TEE) WITH CARDIOVERSION;  Surgeon: Dixie Dials, MD;  Location: [place];  Service: Cardiovascular;  Laterality: N/A;  . Cardioversion N/A 07/13/2014    Procedure: CARDIOVERSION;  Surgeon: Dixie Dials, MD;  Location: [facility] ENDOSCOPY;  Service: Cardiovascular;  Laterality: N/A;  . Left and right heart catheterization with coronary angiogram N/A 07/14/2014    Procedure: LEFT AND RIGHT HEART CATHETERIZATION WITH CORONARY ANGIOGRAM;  Surgeon: Charolette Forward, MD;  Location: [facility] CATH LAB;  Service: Cardiovascular;  Laterality: N/A;  . Hernia repair    . Joint replacement      bilateral knee  . Cholecystectomy    . Cardioversion N/A 10/01/2014    Procedure: CARDIOVERSION;  Surgeon: Larey Dresser, MD;  Location: [facility] ENDOSCOPY;  Service: Cardiovascular;  Laterality: N/A;   History  Smoking status  . smoker for 4-5 years quit in 1960  Smokeless tobacco  . none   History  Alcohol Use No    History   Social History  . Marital Status: Married    Spouse Name: N/A  . Number of Children: None    Occupational History  . retired from [employer]     No Known Allergies  Current Facility-Administered Medications  Medication Dose Route Frequency Provider Last Rate Last Dose  . 0.9 % sodium chloride infusion 250 mL Intravenous PRN Dixie Dials, MD    . 0.9 %  sodium chloride infusion  Intravenous Continuous Charolette Forward, MD  Stopped at 07/15/14 [phone]  . acetaminophen (TYLENOL) tablet 650 mg 650 mg Oral Q4H PRN Charolette Forward, MD    . albuterol (PROVENTIL) (2.5 MG/3ML) 0.083% nebulizer solution 3 mL 3 mL Inhalation Q4H PRN Charolette Forward, MD  3 mL at 07/13/14 1500  . ALPRAZolam Duanne Moron) tablet 0.25 mg 0.25 mg Oral BID Charolette Forward, MD  0.25 mg at  07/14/14 2239  . amiodarone (PACERONE) tablet 200 mg 200 mg Oral BID Charolette Forward, MD  200 mg at 07/14/14 2239  . apixaban (ELIQUIS) tablet 5 mg 5 mg Oral BID Charolette Forward, MD  5 mg at 07/14/14 2239  . atorvastatin (LIPITOR) tablet 20 mg 20 mg Oral q1800 Charolette Forward, MD  20 mg at 07/14/14 1700  . carvedilol (COREG) tablet 3.125 mg 3.125 mg Oral BID WC Dixie Dials, MD  3.125 mg at 07/14/14 1657  . lisinopril (PRINIVIL,ZESTRIL) tablet 5 mg 5 mg Oral Daily Charolette Forward, MD  5 mg at 07/14/14 1721  . mometasone-formoterol (DULERA) 100-5 MCG/ACT inhaler 2 puff 2 puff Inhalation BID Charolette Forward, MD  2 puff at 07/15/14 0742  . ondansetron (ZOFRAN) injection 4 mg 4 mg Intravenous Q6H PRN Charolette Forward, MD    . sodium chloride 0.9 % injection 3 mL 3 mL Intravenous Q12H Charolette Forward, MD  3 mL at 07/12/14 0949  . sodium chloride 0.9 % injection 3 mL 3 mL Intravenous PRN Charolette Forward, MD    . spironolactone (ALDACTONE) tablet 25 mg 25 mg Oral Daily Charolette Forward, MD  25 mg at 07/14/14 1011  . tiotropium (SPIRIVA) inhalation capsule 18 mcg 18 mcg Inhalation Daily Charolette Forward, MD  18 mcg at 07/15/14 2633    Prescriptions prior to admission  Medication Sig Dispense Refill Last Dose  . ADVAIR DISKUS 250-50 MCG/DOSE AEPB Inhale 1 puff into the lungs 2 (two) times daily.    07/09/2014 at Unknown time  . apixaban (ELIQUIS) 5 MG TABS tablet Take 5 mg by mouth 2 (two) times daily.   07/09/2014 at Unknown time  . atorvastatin (LIPITOR) 20 MG tablet Take 20 mg by mouth daily at 6 PM.    07/09/2014 at Unknown time  . azithromycin (ZITHROMAX) 250 MG tablet Take 250 mg by mouth daily.    07/09/2014 at Unknown time  . metoprolol succinate (TOPROL-XL) 50 MG 24 hr tablet Take 100 mg by mouth daily.    07/09/2014 at 0800  . PROAIR HFA 108 (90 BASE) MCG/ACT inhaler  Inhale 1 puff into the lungs every 4 (four) hours as needed for wheezing or shortness of breath.    07/09/2014 at Unknown time  . SPIRIVA HANDIHALER 18 MCG inhalation capsule Place 18 mcg into inhaler and inhale daily.    07/09/2014 at Unknown time  . spironolactone (ALDACTONE) 25 MG tablet Take 25 mg by mouth daily.    07/09/2014 at Unknown time    Family History: Only child , no children, father died suddenly at home at age 28 , ? MI No family history of dissection or aortic any serum   Review of Systems:    Cardiac Review of Systems: Y or N Chest Pain [ n ] Resting SOB [ y ]Exertional SOB [ y ] Orthopnea Blue.Reese ] Pedal Edema Blue.Reese ] Palpitations Blue.Reese ]Syncope [ n ] Presyncope [ y ] General Review of Systems: [Y] = yes [ ] =no Constitional: recent weight change Vixen.Miu ]; anorexia [ ] ; fatigue Blue.Reese ]; nausea [ ] ;  night sweats [ ] ; fever [n ]; or chills [ n ]  Dental: poor dentition[ y ]; Last Dentist visit: Has upper plates and lower implants  Eye : blurred vision [ n ]; diplopia [ ] ; vision changes [ ] ; Amaurosis fugax[ ] ; Resp: cough Blue.Reese ]; wheezing[ y ]; hemoptysis[ n ]; shortness of breath[ y ]; paroxysmal nocturnal dyspnea[ ] ; dyspnea on exertion[y ]; or orthopnea[y ];  GI: gallstones[ ] , vomiting[ ] ; dysphagia[ ] ; melena[ ] ; hematochezia [ ] ; heartburn[ ] ; Hx of Colonoscopy[y ]; GU: kidney stones [ ] ; hematuria[ n ]; dysuria [ ] ; nocturia[ ] ; history of obstruction [ ] ; urinary frequency [ n ]  Skin: rash, swelling[ ] ;, hair loss[ ] ; peripheral edema[ ] ; or itching[ ] ; Musculosketetal: myalgias[ ] ; joint swelling[ y ]; joint erythema[ y ]; joint pain[ ] ; back pain[ ] ; Heme/Lymph: bruising[ ] ; bleeding[ ] ; anemia[ ] ;  Neuro: TIA[  ]; headaches[ ] ; stroke[ ] ; vertigo[ ] ; seizures[ n ]; paresthesias[ ] ; difficulty walking[ n ]; Psych:depression[ ] ; anxiety[ ] ; Endocrine: diabetes[n ]; thyroid dysfunction[ n ]; Immunizations: Flu [n ]; Pneumococcal[ n]; Other:  Physical Exam: BP 127/61 mmHg  Pulse 46  Resp 16  Ht 6\' 2"  (1.88 m)  Wt 165 lb (74.844 kg)  BMI 21.18 kg/m2  SpO2 98%   General appearance: alert, cooperative, appears older than stated age and no distress Head: Normocephalic, without obvious abnormality, atraumatic Neck: no adenopathy, no carotid bruit, no JVD, supple, symmetrical, trachea midline and thyroid not enlarged, symmetric, no tenderness/mass/nodules Lymph nodes: Cervical, supraclavicular, and axillary nodes normal. Resp: clear to auscultation bilaterally Back: symmetric, no curvature. ROM normal. No CVA tenderness. Cardio: now regular Rhythm, ii/vi m of AI GI: soft, non-tender; bowel sounds normal; no masses, no organomegaly Extremities:  No pedal edema extremities normal, atraumatic, no cyanosis or edema and Homans sign is negative, no sign of DVT Neurologic: Grossly normal No carotid bruits Palpable dp and pt pulses, bilateral bilateral knee incisions from knee replacement   Diagnostic Studies & Laboratory data:   Recent Radiology Findings:     Nm Pet Image Initial (pi) Skull Base To Thigh  07/24/2014   CLINICAL DATA:  Initial treatment strategy for right upper lobe pulmonary nodule.  EXAM: NUCLEAR MEDICINE PET SKULL BASE TO THIGH  TECHNIQUE: 10.2 mCi F-18 FDG was injected intravenously. Full-ring PET imaging was performed from the skull base to thigh after the radiotracer. CT data was obtained and used for attenuation correction and anatomic localization.  FASTING BLOOD GLUCOSE:  Value: 109 mg/dl  COMPARISON:  CTA of the chest of 07/10/2014.  FINDINGS: NECK  No areas of abnormal hypermetabolism.  CHEST  No hypermetabolism  within the right upper lobe. The nodule detailed on the prior exam likely represented branching vessels. Example image 32 of series 6 today. There is equivocal soft tissue fullness in this area on image 72 of series 501 of the prior CT. No thoracic nodal hypermetabolism.  ABDOMEN/PELVIS  No areas of abnormal hypermetabolism.  SKELETON  Hypermetabolism involving a posterior right-sided cervical facet. Likely degenerative.  CT IMAGES PERFORMED FOR ATTENUATION CORRECTION  No cervical adenopathy.  Chest, abdomen, and pelvic findings deferred to recent diagnostic CT. No acute superimposed process. Cholecystectomy. Old granulomatous disease in the spleen. Prostatomegaly. Degenerative partial fusion of the bilateral sacroiliac joints.  IMPRESSION: No right upper lobe hypermetabolism to correspond to the right upper lobe Nodule detailed on the prior exam. This is favored to represent a branching vessel, when correlated with today's CT. As there is equivocal  soft tissue fullness in this area on thin slice imaging of the prior diagnostic CTA, followup with chest CT at 6 months should be considered.   Electronically Signed   By: Abigail Miyamoto M.D.   On: 07/24/2014 15:57   Dg Chest Portable 1 View  07/10/2014   CLINICAL DATA:  Shortness of breath and chest discomfort.  EXAM: PORTABLE CHEST - 1 VIEW  COMPARISON:  Remote frontal and lateral views 10/23/2007  FINDINGS: The heart is enlarged. Mild prominence of central pulmonary vasculature without pulmonary edema. Lungs remain hyperinflated. No consolidation, pleural effusion, or pneumothorax. Minimal atelectasis or scarring at the left lung base.  IMPRESSION: 1. Mild cardiomegaly. 2. Hyperinflation with left basilar atelectasis.   Electronically Signed   By: Jeb Levering M.D.   On: 07/10/2014 02:25   Ct Angio Abd/pel W/ And/or W/o  07/10/2014   CLINICAL DATA:  74 year old male with increasing shortness of breath for the past 2 weeks, cough, chest tightness and shortness  of breath, which worsens when lying flat. History of atrial fibrillation. History of thoracic aortic aneurysm.  EXAM: CT ANGIOGRAPHY CHEST, ABDOMEN AND PELVIS  TECHNIQUE: Multidetector CT imaging through the chest, abdomen and pelvis was performed using the standard protocol during bolus administration of intravenous contrast. Multiplanar reconstructed images and MIPs were obtained and reviewed to evaluate the vascular anatomy.  CONTRAST:  153mL OMNIPAQUE IOHEXOL 350 MG/ML SOLN  COMPARISON:  None.  FINDINGS: CTA CHEST FINDINGS  Mediastinum/Lymph Nodes: Aneurysmal dilatation of the aortic root which measures 3.2 cm in diameter at the annulus, but 5.7 cm in diameter at the level of the sinuses of Valsalva. The sino-tubular junction is effaced. The thoracic aortic arch is normal in caliber at 2.8 cm. The isthmus of the thoracic aorta is mildly ectatic measuring 4 cm in diameter. The descending thoracic aorta is normal in caliber at 3 cm in diameter. No evidence of thoracic aortic dissection. Heart size is mildly enlarged. There is no significant pericardial fluid, thickening or pericardial calcification. There is atherosclerosis of the thoracic aorta, the great vessels of the mediastinum and the coronary arteries, including calcified atherosclerotic plaque in the left main and left anterior descending coronary arteries. Multiple borderline enlarged and mildly enlarged mediastinal and right hilar lymph nodes, largest of which is in the low right paratracheal nodal station measuring 14 mm in short axis. Multiple densely calcified right hilar and mediastinal lymph nodes, presumably from old granulomatous disease. Esophagus is normal in appearance. Multiple borderline enlarged and mildly enlarged left axillary lymph nodes measuring up to 10 mm in short axis.  Lungs/Pleura: Large calcified granuloma in the posterior aspect of the right lower lobe with adjacent scarring. 12 x 7 mm right upper lobe pulmonary nodule (image 29  of series 506). No acute consolidative airspace disease. No pleural effusions. Diffuse subpleural bullae are noted in the apices of the lungs bilaterally. Small left-sided Bochdalek's hernia.  Musculoskeletal/Soft Tissues: There are no aggressive appearing lytic or blastic lesions noted in the visualized portions of the skeleton.  Review of the MIP images confirms the above findings.  CTA ABDOMEN AND PELVIS FINDINGS  Hepatobiliary: Status post cholecystectomy. 8 mm low attenuation lesion in segment 6 of the liver (image 180 of series 501), too small to characterize, but statistically likely a tiny cyst. No other suspicious appearing hepatic lesions. No intra or extrahepatic biliary ductal dilatation.  Pancreas: Unremarkable.  Spleen: Multiple calcifications in the spleen, and irregular splenic contour, which could relate to remote trauma or prior splenic  infarcts.  Adrenals/Urinary Tract: Bilateral kidneys and bilateral adrenal glands are normal in appearance. No hydroureteronephrosis. Urinary bladder is normal in appearance.  Stomach/Bowel: Who normal appearance of the stomach. No pathologic dilatation of small bowel or colon. Normal appendix.  Vascular/Lymphatic: Atherosclerosis throughout the abdominal and pelvic vasculature. Aneurysmal dilatation of the common iliac arteries bilaterally which measure up to 1.6 cm in diameter bilaterally. No evidence of dissection. No lymphadenopathy noted in the abdomen or pelvis.  Reproductive: Prostate gland is enlarged and heterogeneous in appearance measuring 6.3 x 4.1 cm. Seminal vesicles are unremarkable in appearance.  Other: No significant volume of ascites.  No pneumoperitoneum.  Musculoskeletal: There are no aggressive appearing lytic or blastic lesions noted in the visualized portions of the skeleton.  Review of the MIP images confirms the above findings.  IMPRESSION: 1. Aneurysmal dilatation of the aortic root, which measures up to 5.7 cm in diameter at the level of  the sinuses of Valsalva. There is effacement of the sino-tubular junction. This is commonly seen in the setting of Marfan syndrome. No evidence of aortic dissection at this time. There is also some ectasia at of the isthmus of the thoracic aorta which measures up to 4 cm in diameter. 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 2. 12 x 7 mm right upper lobe pulmonary nodule (image 29 of series 506). Followup evaluation with PET-CT is recommended in the near future to evaluate for potential malignancy. 3. Left main and left anterior descending coronary artery disease. Assessment for potential risk factor modification, dietary therapy or pharmacologic therapy may be warranted, if clinically indicated. 4. Mild cardiomegaly. 49. Sequela of old granulomatous disease, as above. Multiple borderline enlarged and mildly enlarged mediastinal and right hilar lymph nodes that are noncalcified are also noted, as well as borderline enlarged and minimally enlarged left axillary lymph nodes. These are nonspecific, but clinical correlation to exclude the possibility of underlying lymphoproliferative disorder may be appropriate. 6. Additional incidental findings, as above.   Electronically Signed   By: Vinnie Langton M.D.   On: 07/10/2014 21:47   Ct Angio Chest Pe W/cm &/or Wo Cm  07/10/2014 CLINICAL DATA: 74 year old male with increasing shortness of breath for the past 2 weeks, cough, chest tightness and shortness of breath, which worsens when lying flat. History of atrial fibrillation. History of thoracic aortic aneurysm. EXAM: CT ANGIOGRAPHY CHEST, ABDOMEN AND PELVIS TECHNIQUE: Multidetector CT imaging through the chest, abdomen and pelvis was performed using the standard protocol during bolus administration of intravenous contrast. Multiplanar reconstructed images and MIPs were obtained and reviewed to evaluate the vascular anatomy.  CONTRAST: 155mL OMNIPAQUE IOHEXOL 350 MG/ML SOLN COMPARISON: None. FINDINGS: CTA CHEST FINDINGS Mediastinum/Lymph Nodes: Aneurysmal dilatation of the aortic root which measures 3.2 cm in diameter at the annulus, but 5.7 cm in diameter at the level of the sinuses of Valsalva. The sino-tubular junction is effaced. The thoracic aortic arch is normal in caliber at 2.8 cm. The isthmus of the thoracic aorta is mildly ectatic measuring 4 cm in diameter. The descending thoracic aorta is normal in caliber at 3 cm in diameter. No evidence of thoracic aortic dissection. Heart size is mildly enlarged. There is no significant pericardial fluid, thickening or pericardial calcification. There is atherosclerosis of the thoracic aorta, the great vessels of the mediastinum and the coronary arteries, including calcified atherosclerotic plaque in the left main and left anterior descending coronary arteries. Multiple borderline enlarged  and mildly enlarged mediastinal and right hilar lymph nodes, largest of which is in the low right paratracheal nodal station measuring 14 mm in short axis. Multiple densely calcified right hilar and mediastinal lymph nodes, presumably from old granulomatous disease. Esophagus is normal in appearance. Multiple borderline enlarged and mildly enlarged left axillary lymph nodes measuring up to 10 mm in short axis. Lungs/Pleura: Large calcified granuloma in the posterior aspect of the right lower lobe with adjacent scarring. 12 x 7 mm right upper lobe pulmonary nodule (image 29 of series 506). No acute consolidative airspace disease. No pleural effusions. Diffuse subpleural bullae are noted in the apices of the lungs bilaterally. Small left-sided Bochdalek's hernia. Musculoskeletal/Soft Tissues: There are no aggressive appearing lytic or blastic lesions noted in the visualized portions of the skeleton. Review of the MIP images confirms the above findings. CTA ABDOMEN AND PELVIS FINDINGS  Hepatobiliary: Status post cholecystectomy. 8 mm low attenuation lesion in segment 6 of the liver (image 180 of series 501), too small to characterize, but statistically likely a tiny cyst. No other suspicious appearing hepatic lesions. No intra or extrahepatic biliary ductal dilatation. Pancreas: Unremarkable. Spleen: Multiple calcifications in the spleen, and irregular splenic contour, which could relate to remote trauma or prior splenic infarcts. Adrenals/Urinary Tract: Bilateral kidneys and bilateral adrenal glands are normal in appearance. No hydroureteronephrosis. Urinary bladder is normal in appearance. Stomach/Bowel: Who normal appearance of the stomach. No pathologic dilatation of small bowel or colon. Normal appendix. Vascular/Lymphatic: Atherosclerosis throughout the abdominal and pelvic vasculature. Aneurysmal dilatation of the common iliac arteries bilaterally which measure up to 1.6 cm in diameter bilaterally. No evidence of dissection. No lymphadenopathy noted in the abdomen or pelvis. Reproductive: Prostate gland is enlarged and heterogeneous in appearance measuring 6.3 x 4.1 cm. Seminal vesicles are unremarkable in appearance. Other: No significant volume of ascites. No pneumoperitoneum. Musculoskeletal: There are no aggressive appearing lytic or blastic lesions noted in the visualized portions of the skeleton. Review of the MIP images confirms the above findings. IMPRESSION: 1. Aneurysmal dilatation of the aortic root, which measures up to 5.7 cm in diameter at the level of the sinuses of Valsalva. There is effacement of the sino-tubular junction. This is commonly seen in the setting of Marfan syndrome. No evidence of aortic dissection at this time. There is also some ectasia at of the isthmus of the thoracic aorta which measures up to 4 cm in diameter. 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 2. 12  x 7 mm right upper lobe pulmonary nodule (image 29 of series 506). Followup evaluation with PET-CT is recommended in the near future to evaluate for potential malignancy. 3. Left main and left anterior descending coronary artery disease. Assessment for potential risk factor modification, dietary therapy or pharmacologic therapy may be warranted, if clinically indicated. 4. Mild cardiomegaly. 49. Sequela of old granulomatous disease, as above. Multiple borderline enlarged and mildly enlarged mediastinal and right hilar lymph nodes that are noncalcified are also noted, as well as borderline enlarged and minimally enlarged left axillary lymph nodes. These are nonspecific, but clinical correlation to exclude the possibility of underlying lymphoproliferative disorder may be appropriate. 6. Additional incidental findings, as above. Electronically Signed By: Vinnie Langton M.D. On: 07/10/2014 21:47   Dg Chest Portable 1 View  07/10/2014 CLINICAL DATA: Shortness of breath and chest discomfort. EXAM: PORTABLE CHEST - 1 VIEW COMPARISON: Remote frontal and lateral views 10/23/2007 FINDINGS: The heart  is enlarged. Mild prominence of central pulmonary vasculature without pulmonary edema. Lungs remain hyperinflated. No consolidation, pleural effusion, or pneumothorax. Minimal atelectasis or scarring at the left lung base. IMPRESSION: 1. Mild cardiomegaly. 2. Hyperinflation with left basilar atelectasis. Electronically Signed By: Jeb Levering M.D. On: 07/10/2014 02:25    I have independently reviewed the above radiologic studies.  Cardiac Cath:FINDINGS: LV showed LV was moderately enlarged. There was global hypokinesia. EF of approximately 20% to 25%. Left main was very short, which was patent. LAD was patent. Diagonal 1 and 2 were patent. Left circumflex was patent. OM-1 was small, which was patent. OM-2 was moderate sized, which was patent. RCA was patent. PDA and PLV  branches were patent.  The patient tolerated the procedure well. There were no complications. The patient was transferred to recovery room in stable condition  PLAN: To maximize beta-blockers and ACE inhibitors. We will start Eliquis tomorrow and possibly discharge tomorrow. The patient will be scheduled for PET scan as an outpatient and open heart surgery for aortic root replacement, possibly aortic valve as an outpatient.   Allegra Lai. Terrence Dupont, M.D.  I have independently reviewed the above cath films and reviewed the findings with the patient .   Recent Lab Findings:  Recent Labs    Lab Results  Component Value Date   WBC 4.8 07/15/2014   HGB 14.4 07/15/2014   HCT 43.4 07/15/2014   PLT 168 07/15/2014   GLUCOSE 113* 07/13/2014   ALT 149* 07/10/2014   AST 79* 07/10/2014   NA 138 07/13/2014   K 4.0 07/13/2014   CL 106 07/13/2014   CREATININE 0.88 07/13/2014   BUN 15 07/13/2014   CO2 24 07/13/2014   INR 1.47 07/14/2014     RecentCardiac CATH: 06/2014  FINDINGS: LV showed LV was moderately enlarged. There was global hypokinesia. EF of approximately 20% to 25%. Left main was very short, which was patent. LAD was patent. Diagonal 1 and 2 were patent. Left circumflex was patent. OM-1 was small, which was patent. OM-2 was moderate sized, which was patent. RCA was patent. PDA and PLV branches were patent.  The Swan findings were RA pressure was 3 mmHg, RV was 26/1, PA was 24/7, wedge pressure was 16. Cardiac output by Fick was 4.10 and by thermodilution it was 4.65.  REPEAT ECHO: 07/2014: ------------------------------------------------------------------- LV EF: 15% -  20%  ------------------------------------------------------------------- Indications:   CHF - 428.0.  ------------------------------------------------------------------- History:  PMH:  Congestive heart  failure.  ------------------------------------------------------------------- Study Conclusions  - Left ventricle: The cavity size was moderately dilated. The estimated ejection fraction was in the range of 15% to 20%. Wall motion was normal; there were no regional wall motion abnormalities. - Aortic valve: There was mild regurgitation. - Left atrium: The atrium was mildly dilated. - Atrial septum: No defect or patent foramen ovale was identified. - Pulmonary arteries: Systolic pressure was mildly increased.  Echocardiography. M-mode, complete 2D, spectral Doppler, and color Doppler. Birthdate: Patient birthdate: [birth year]. Age: Patient is 74 yr old. Sex: Gender: male.  BMI: 22.9 kg/m^2. Blood pressure:   114/72 Patient status: Outpatient. Study date: Study date: 08/07/2014. Study time: 10:02 AM. Location: Echo laboratory.  -------------------------------------------------------------------  ------------------------------------------------------------------- Left ventricle: The cavity size was moderately dilated. The estimated ejection fraction was in the range of 15% to 20%. Wall motion was normal; there were no regional wall motion abnormalities.  ------------------------------------------------------------------- Aortic valve:  Structurally normal valve.  Cusp separation was normal. Doppler: Transvalvular velocity was within the normal range. There was  no stenosis. There was mild regurgitation.  ------------------------------------------------------------------- Aorta: The aorta was moderately dilated. There was moderate dilation of the sinus(es) of Valsalva. Aortic root: The aortic root was moderately dilated. Ascending aorta: The ascending aorta was moderately dilated.  ------------------------------------------------------------------- Mitral valve:  Structurally normal valve.  Leaflet separation was normal. Doppler: Transvalvular  velocity was within the normal range. There was no evidence for stenosis. There was trivial regurgitation.  ------------------------------------------------------------------- Left atrium: The atrium was mildly dilated.  ------------------------------------------------------------------- Atrial septum: No defect or patent foramen ovale was identified.  ------------------------------------------------------------------- Right ventricle: The cavity size was normal. Wall thickness was normal. Systolic function was normal.  ------------------------------------------------------------------- Pulmonic valve:  Doppler: There was no significant regurgitation.  ------------------------------------------------------------------- Tricuspid valve:  Structurally normal valve.  Leaflet separation was normal. Doppler: Transvalvular velocity was within the normal range. There was mild regurgitation.  ------------------------------------------------------------------- Pulmonary artery:  Systolic pressure was mildly increased.  ------------------------------------------------------------------- Right atrium: The atrium was normal in size.  ------------------------------------------------------------------- Pericardium: There was no pericardial effusion.  ------------------------------------------------------------------- Post procedure conclusions Ascending Aorta:  - The aorta was moderately dilated.  ------------------------------------------------------------------- Measurements  Left ventricle              Value    Reference LV ID, ED, PLAX chordal     (H)   70.7 mm   43 - 52 LV ID, ES, PLAX chordal     (H)   67.5 mm   23 - 38 LV fx shortening, PLAX chordal  (L)   5   %   >=29 LV PW thickness, ED           8.12 mm   --------- IVS/LV PW ratio, ED           1.16     <=1.3 LV e&', lateral               8.49 cm/s  --------- LV E/e&', lateral             6.53     --------- LV e&', medial              6.09 cm/s  --------- LV E/e&', medial             9.1     --------- LV e&', average              7.29 cm/s  --------- LV E/e&', average             7.6     ---------  Ventricular septum            Value    Reference IVS thickness, ED            9.38 mm   ---------  Aortic valve               Value    Reference Aortic regurg pressure half-time     710  ms   ---------  Aorta                  Value    Reference Aortic root ID, ED            49  mm   ---------  Left atrium               Value    Reference LA ID, A-P, ES              44  mm   --------- LA ID/bsa, A-P  2.14 cm/m^2 <=2.2 LA volume, S               93.3 ml   --------- LA volume/bsa, S             45.5 ml/m^2 --------- LA volume, ES, 1-p A4C          74.6 ml   --------- LA volume/bsa, ES, 1-p A4C        36.4 ml/m^2 --------- LA volume, ES, 1-p A2C          107  ml   --------- LA volume/bsa, ES, 1-p A2C        52.2 ml/m^2 ---------  Mitral valve               Value    Reference Mitral E-wave peak velocity       55.4 cm/s  --------- Mitral A-wave peak velocity       39.8 cm/s  --------- Mitral deceleration time     (H)   246  ms   150 - 230 Mitral E/A ratio, peak          1.4     ---------  Pulmonary arteries            Value    Reference PA pressure, S, DP            20  mm Hg <=30  Tricuspid valve             Value    Reference Tricuspid regurg peak velocity       209  cm/s  --------- Tricuspid peak RV-RA gradient      17  mm Hg ---------  Systemic veins              Value    Reference Estimated CVP              3   mm Hg ---------  Right ventricle             Value    Reference RV pressure, S, DP            20  mm Hg <=30 RV s&', lateral, S            6.42 cm/s  ---------  Legend: (L) and (H) mark values outside specified reference range.  ------------------------------------------------------------------- Prepared and Electronically Authenticated by  Charolette Forward, MD 2016-04-08T13:05:59  ------------------------------------------------------------------- PFT's 07/12/2014 21.5 57% DLCO 25.45 67% I does get Response to bronchodilator Airtrapping Mild Diffusion Defect  Aortic Size Index=     5.7    /Body surface area is 1.98 meters squared. 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 No evidence of aortic dissection at this time. 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 Now in sinus at  controlled rate  5/Left main and left anterior descending coronary artery calcification on CT but without significant CAD at cath  6/ AFib-  on Elliquis  7/ COPD- Moderately severe Obstructive Airways Disease, Response to bronchodilator, Airtrapping, Mild Diffusion Defect  Recommend:   With depressed lv function out of proportion to CAD or degree of AI suspect af/tachycaria mediated lv dysfunction, symptoms of HF  have improved   Has been seen  in HF clinic, ACE increased,   Aggressive treatment of heart failure before proceeding with root replacement would be of benefit.  Per Dr Aundra Dubin: Aortic root size large enough to warrant prophylactic repair, however would like to aggressively treat his cardiomyopathy prior to taking him for surgery. Will get MRA chest to reassess aorta with cardiac MRI in 9/16   Aortic root replacement and poss MAZE if improvement in LV function  Will see back in 6 weeks to see if any progress has been made  I  spent 42minutes counseling the patient face to face and 50% or more the  time was spent in counseling and coordination of care. The total time spent in the appointment was  15 minutes.      Grace Isaac MD      [place],[place] [address] Office [phone]   Beeper [phone]  11/05/2014 12:54 PM

## 2014-11-12 ENCOUNTER — Other Ambulatory Visit (HOSPITAL_COMMUNITY): Payer: Self-pay | Admitting: Cardiology

## 2014-11-12 DIAGNOSIS — I255 Ischemic cardiomyopathy: Secondary | ICD-10-CM

## 2014-11-13 ENCOUNTER — Encounter: Payer: Self-pay | Admitting: Cardiology

## 2014-11-16 ENCOUNTER — Ambulatory Visit (HOSPITAL_COMMUNITY)
Admission: RE | Admit: 2014-11-16 | Discharge: 2014-11-16 | Disposition: A | Discharge status: Home / Self Care | Payer: Medicare Other | Source: Ambulatory Visit | Attending: Internal Medicine | Admitting: Internal Medicine

## 2014-11-16 DIAGNOSIS — I5022 Chronic systolic (congestive) heart failure: Secondary | ICD-10-CM | POA: Diagnosis not present

## 2014-11-16 LAB — BASIC METABOLIC PANEL
Anion gap: 5 (ref 5–15)
BUN: 17 mg/dL (ref 6–20)
CHLORIDE: 102 mmol/L (ref 101–111)
CO2: 28 mmol/L (ref 22–32)
Calcium: 9.3 mg/dL (ref 8.9–10.3)
Creatinine, Ser: 1.07 mg/dL (ref 0.61–1.24)
GFR calc Af Amer: >60 mL/min (ref >60)
GFR calc non Af Amer: >60 mL/min (ref >60)
GLUCOSE: 131 mg/dL — AB (ref 65–99)
POTASSIUM: 4.6 mmol/L (ref 3.5–5.1)
Sodium: 135 mmol/L (ref 135–145)

## 2014-11-26 ENCOUNTER — Ambulatory Visit (INDEPENDENT_AMBULATORY_CARE_PROVIDER_SITE_OTHER): Payer: Medicare Other | Admitting: Pulmonary Disease

## 2014-11-26 ENCOUNTER — Encounter: Payer: Self-pay | Admitting: Pulmonary Disease

## 2014-11-26 VITALS — BP 126/64 | HR 51 | Ht 74.0 in | Wt 158.0 lb

## 2014-11-26 DIAGNOSIS — J410 Simple chronic bronchitis: Secondary | ICD-10-CM

## 2014-11-26 DIAGNOSIS — I5022 Chronic systolic (congestive) heart failure: Secondary | ICD-10-CM | POA: Diagnosis not present

## 2014-11-26 DIAGNOSIS — Z23 Encounter for immunization: Secondary | ICD-10-CM

## 2014-11-26 DIAGNOSIS — I719 Aortic aneurysm of unspecified site, without rupture: Secondary | ICD-10-CM

## 2014-11-26 DIAGNOSIS — I255 Ischemic cardiomyopathy: Secondary | ICD-10-CM | POA: Diagnosis not present

## 2014-11-26 DIAGNOSIS — J449 Chronic obstructive pulmonary disease, unspecified: Secondary | ICD-10-CM | POA: Insufficient documentation

## 2014-11-26 DIAGNOSIS — I7121 Aneurysm of the ascending aorta, without rupture: Secondary | ICD-10-CM

## 2014-11-26 NOTE — Patient Instructions (Signed)
Keep taking your inhaled medicines as you're doing Get a flu shot in the fall From my standpoint he should proceed with cardiac surgery We will see you back in 6 months or sooner if needed

## 2014-11-26 NOTE — Assessment & Plan Note (Signed)
Because he only has moderate airflow obstruction and minimal symptoms he should proceed with cardiac surgery knowing that he is low risk for a perioperative pulmonary complication.  Plan: In the event of cardiac surgery I suggested he continue to take his medications, it out of bed as soon as possible, and perform pulmonary toilet measures as soon as possible after surgery

## 2014-11-26 NOTE — Addendum Note (Signed)
Addended by: Len Blalock on: 11/26/2014 05:05 PM   Modules accepted: Orders

## 2014-11-26 NOTE — Progress Notes (Signed)
Subjective:    Patient ID: [name], male    DOB: [birth year], 74 y.o.   MRN: [id number]  HPI Chief Complaint  Patient presents with  . Advice Only    Referred by Dr. Aundra Dubin for COPD.  pt c/o increasing sob X1 year.     [name] is here to see me today for evaluation of his COPD.  He has significant cardiovascalur disease including aortic root dilation, atrial fibrillation, and congestive heart failure.  He is a retired Engineer, structural.  He tells me that he smoked some when he was a teenager.  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    He has bronchitis about once per year.  He says that in the last few months he has had more mucus production.  He thinks this episode is clearing up on its own without treatment.  He tells me however that he has no trouble breathing.  He can mow the grass without difficulty and never really gets short of breath.  He says that he can walk quite far without getiting short of breath.  He walks about a mile at a time now.  He can carry in groceries and run a vacuum cleaner and never gets dyspneic.  He never uses a rescue inhaler, except recently when he had the bronchitis.  He does not think he has been admitted to the hospital for a primary pulmonary problem.    He has received a significant amount of care in the [place] area.  He used to follow with Dr. Vella Kohler for years.  He has been taking Advair for several years.    He has lost 50-60 pounds in the last several months.    Past Medical History  Diagnosis Date  . A-fib   . Hypertension   . Asthma   . CHF (congestive heart failure)   . Seasonal allergies      No family history on file.   History   Social History  . Marital Status: Married    Spouse Name: N/A  . Number of Children: N/A  . Years of Education: N/A   Occupational History  . Not on file.   Social History Main Topics  . Smoking  status: Never Smoker   . Smokeless tobacco: Never Used  . Alcohol Use: No  . Drug Use: Not on file  . Sexual Activity: Not on file   Other Topics Concern  . Not on file   Social History Narrative     No Known Allergies   Outpatient Prescriptions Prior to Visit  Medication Sig Dispense Refill  . ADVAIR DISKUS 250-50 MCG/DOSE AEPB Inhale 1 puff into the lungs 2 (two) times daily.     Marland Kitchen ALPRAZolam (XANAX) 0.25 MG tablet Take 0.25 mg by mouth at bedtime as needed for anxiety or sleep.     Marland Kitchen amiodarone (PACERONE) 200 MG tablet Take 1 tablet (200 mg total) by mouth daily. 30 tablet 3  . apixaban (ELIQUIS) 5 MG TABS tablet Take 5 mg by mouth 2 (two) times daily.    Marland Kitchen atorvastatin (LIPITOR) 20 MG tablet Take 20 mg by mouth daily at 6 PM.     . carvedilol (COREG) 6.25 MG tablet Take 1.5 tablets (9.375 mg total) by mouth 2 (two) times daily with a meal. 90 tablet 3  . lisinopril (PRINIVIL,ZESTRIL) 5 MG tablet Take 1 tablet (5  mg total) by mouth 2 (two) times daily. 60 tablet 3  . Multiple Vitamin (MULTIVITAMIN) tablet Take 1 tablet by mouth daily.    Marland Kitchen PROAIR HFA 108 (90 BASE) MCG/ACT inhaler Inhale 1 puff into the lungs every 4 (four) hours as needed for wheezing or shortness of breath.     . SPIRIVA HANDIHALER 18 MCG inhalation capsule Place 18 mcg into inhaler and inhale daily.     Marland Kitchen spironolactone (ALDACTONE) 25 MG tablet Take 0.5 tablets (12.5 mg total) by mouth daily. 15 tablet 3   No facility-administered medications prior to visit.       Review of Systems  Constitutional: Positive for unexpected weight change. Negative for fever.  HENT: Positive for congestion. Negative for dental problem, ear pain, nosebleeds, postnasal drip, rhinorrhea, sinus pressure, sneezing, sore throat and trouble swallowing.   Eyes: Negative for redness and itching.  Respiratory: Positive for cough. Negative for chest tightness, shortness of breath and wheezing.   Cardiovascular: Negative for palpitations  and leg swelling.  Gastrointestinal: Negative for nausea and vomiting.  Genitourinary: Negative for dysuria.  Musculoskeletal: Negative for joint swelling.  Skin: Negative for rash.  Neurological: Negative for headaches.  Hematological: Does not bruise/bleed easily.  Psychiatric/Behavioral: Negative for dysphoric mood. The patient is nervous/anxious.        Objective:   Physical Exam Filed Vitals:   11/26/14 1542  BP: 126/64  Pulse: 51  Height: 6\' 2"  (1.88 m)  Weight: 158 lb (71.668 kg)  SpO2: 98%  RA  Gen: well appearing, no acute distress HENT: NCAT, OP clear, neck supple without masses Eyes: PERRL, EOMi Lymph: no cervical lymphadenopathy PULM: Slight wheeze left lung, good air movement CV: Irreg irreg, no mgr, no JVD GI: BS+, soft, nontender, no hsm Derm: no rash or skin breakdown MSK: normal bulk and tone Neuro: A&Ox4, CN II-XII intact, strength 5/5 in all 4 extremities Psyche: normal mood and affect   Cardiology records reviewed where it is shown that he has a low LVEF (20%), and a dilated aortic root as well as atrial fibrillation. He is being considered for aortic root replacement.  March 2016 pulmonary function testing ratio 61%, FEV1 2.48 L (66% predicted, 15% change with bronchodilation her), total lung capacity 7.96 L (101% predicted), residual volume 139% predicted), DLCO 25.46 (67% predicted) March 2016 CT chest images personally reviewed showing scattered blebs and very mild occasional paraseptal emphysema but otherwise normal pulmonary parenchyma, cardiomegaly was noted     Assessment & Plan:  COPD (chronic obstructive pulmonary disease) He has moderate airflow obstruction but minimal symptoms. I have personally reviewed the images from his CT chest from this year which showed very minimal emphysema. He has an exacerbation approximately one time per year. Based on this he is a Gold grade B, and currently I think he's actually taking too many inhalers for his  degree of symptoms.  However, because he has been doing well recently and he has an upcoming cardiac surgery, I would prefer to keep his inhalers as ordered for now. After surgery we will consolidate to a combined long-acting bronchodilator medication like Stiolto.  Plan: Prevnar vaccine today Flu shot in the fall Continue Advair and Spiriva for now, but will likely consolidate these to 1 inhaler in the future Proceed with cardiac surgery Follow-up 6 months  Chronic systolic CHF (congestive heart failure) He has congestive heart failure with a depressed ejection fraction. Today on exam he appears euvolemic. I recommend that he continue treatment as directed by  Dr. Marigene Ehlers.  Aortic root aneurysm Because he only has moderate airflow obstruction and minimal symptoms he should proceed with cardiac surgery knowing that he is low risk for a perioperative pulmonary complication.  Plan: In the event of cardiac surgery I suggested he continue to take his medications, it out of bed as soon as possible, and perform pulmonary toilet measures as soon as possible after surgery     Current outpatient prescriptions:  .  ADVAIR DISKUS 250-50 MCG/DOSE AEPB, Inhale 1 puff into the lungs 2 (two) times daily. , Disp: , Rfl:  .  ALPRAZolam (XANAX) 0.25 MG tablet, Take 0.25 mg by mouth at bedtime as needed for anxiety or sleep. , Disp: , Rfl:  .  amiodarone (PACERONE) 200 MG tablet, Take 1 tablet (200 mg total) by mouth daily., Disp: 30 tablet, Rfl: 3 .  apixaban (ELIQUIS) 5 MG TABS tablet, Take 5 mg by mouth 2 (two) times daily., Disp: , Rfl:  .  atorvastatin (LIPITOR) 20 MG tablet, Take 20 mg by mouth daily at 6 PM. , Disp: , Rfl:  .  carvedilol (COREG) 6.25 MG tablet, Take 1.5 tablets (9.375 mg total) by mouth 2 (two) times daily with a meal., Disp: 90 tablet, Rfl: 3 .  lisinopril (PRINIVIL,ZESTRIL) 5 MG tablet, Take 1 tablet (5 mg total) by mouth 2 (two) times daily., Disp: 60 tablet, Rfl: 3 .  Multiple  Vitamin (MULTIVITAMIN) tablet, Take 1 tablet by mouth daily., Disp: , Rfl:  .  PROAIR HFA 108 (90 BASE) MCG/ACT inhaler, Inhale 1 puff into the lungs every 4 (four) hours as needed for wheezing or shortness of breath. , Disp: , Rfl:  .  SPIRIVA HANDIHALER 18 MCG inhalation capsule, Place 18 mcg into inhaler and inhale daily. , Disp: , Rfl:  .  spironolactone (ALDACTONE) 25 MG tablet, Take 0.5 tablets (12.5 mg total) by mouth daily., Disp: 15 tablet, Rfl: 3

## 2014-11-26 NOTE — Assessment & Plan Note (Signed)
He has moderate airflow obstruction but minimal symptoms. I have personally reviewed the images from his CT chest from this year which showed very minimal emphysema. He has an exacerbation approximately one time per year. Based on this he is a Gold grade B, and currently I think he's actually taking too many inhalers for his degree of symptoms.  However, because he has been doing well recently and he has an upcoming cardiac surgery, I would prefer to keep his inhalers as ordered for now. After surgery we will consolidate to a combined long-acting bronchodilator medication like Stiolto.  Plan: Prevnar vaccine today Flu shot in the fall Continue Advair and Spiriva for now, but will likely consolidate these to 1 inhaler in the future Proceed with cardiac surgery Follow-up 6 months

## 2014-11-26 NOTE — Assessment & Plan Note (Signed)
He has congestive heart failure with a depressed ejection fraction. Today on exam he appears euvolemic. I recommend that he continue treatment as directed by Dr. Marigene Ehlers.

## 2014-11-27 ENCOUNTER — Other Ambulatory Visit (HOSPITAL_COMMUNITY): Payer: Medicare Other

## 2014-12-17 ENCOUNTER — Ambulatory Visit (INDEPENDENT_AMBULATORY_CARE_PROVIDER_SITE_OTHER): Payer: Medicare Other | Admitting: Cardiothoracic Surgery

## 2014-12-17 ENCOUNTER — Ambulatory Visit (HOSPITAL_COMMUNITY)
Admission: RE | Admit: 2014-12-17 | Discharge: 2014-12-17 | Disposition: A | Discharge status: Home / Self Care | Payer: Medicare Other | Source: Ambulatory Visit | Attending: Cardiology | Admitting: Cardiology

## 2014-12-17 ENCOUNTER — Encounter: Payer: Self-pay | Admitting: Cardiothoracic Surgery

## 2014-12-17 VITALS — BP 97/54 | HR 48 | Resp 20 | Ht 74.0 in | Wt 154.0 lb

## 2014-12-17 VITALS — BP 88/42 | HR 46 | Wt 154.4 lb

## 2014-12-17 DIAGNOSIS — Z8249 Family history of ischemic heart disease and other diseases of the circulatory system: Secondary | ICD-10-CM | POA: Diagnosis not present

## 2014-12-17 DIAGNOSIS — I351 Nonrheumatic aortic (valve) insufficiency: Secondary | ICD-10-CM | POA: Insufficient documentation

## 2014-12-17 DIAGNOSIS — I2510 Atherosclerotic heart disease of native coronary artery without angina pectoris: Secondary | ICD-10-CM

## 2014-12-17 DIAGNOSIS — I7121 Aneurysm of the ascending aorta, without rupture: Secondary | ICD-10-CM

## 2014-12-17 DIAGNOSIS — Z79899 Other long term (current) drug therapy: Secondary | ICD-10-CM | POA: Diagnosis not present

## 2014-12-17 DIAGNOSIS — Z87891 Personal history of nicotine dependence: Secondary | ICD-10-CM | POA: Insufficient documentation

## 2014-12-17 DIAGNOSIS — E785 Hyperlipidemia, unspecified: Secondary | ICD-10-CM | POA: Insufficient documentation

## 2014-12-17 DIAGNOSIS — I480 Paroxysmal atrial fibrillation: Secondary | ICD-10-CM | POA: Diagnosis not present

## 2014-12-17 DIAGNOSIS — Z7902 Long term (current) use of antithrombotics/antiplatelets: Secondary | ICD-10-CM | POA: Insufficient documentation

## 2014-12-17 DIAGNOSIS — I77810 Thoracic aortic ectasia: Secondary | ICD-10-CM | POA: Diagnosis not present

## 2014-12-17 DIAGNOSIS — I440 Atrioventricular block, first degree: Secondary | ICD-10-CM | POA: Diagnosis not present

## 2014-12-17 DIAGNOSIS — J438 Other emphysema: Secondary | ICD-10-CM | POA: Diagnosis not present

## 2014-12-17 DIAGNOSIS — I719 Aortic aneurysm of unspecified site, without rupture: Secondary | ICD-10-CM

## 2014-12-17 DIAGNOSIS — I482 Chronic atrial fibrillation, unspecified: Secondary | ICD-10-CM

## 2014-12-17 DIAGNOSIS — I452 Bifascicular block: Secondary | ICD-10-CM | POA: Diagnosis not present

## 2014-12-17 DIAGNOSIS — I10 Essential (primary) hypertension: Secondary | ICD-10-CM | POA: Insufficient documentation

## 2014-12-17 DIAGNOSIS — I255 Ischemic cardiomyopathy: Secondary | ICD-10-CM

## 2014-12-17 DIAGNOSIS — I429 Cardiomyopathy, unspecified: Secondary | ICD-10-CM

## 2014-12-17 DIAGNOSIS — Q2543 Congenital aneurysm of aorta: Secondary | ICD-10-CM

## 2014-12-17 DIAGNOSIS — J449 Chronic obstructive pulmonary disease, unspecified: Secondary | ICD-10-CM | POA: Diagnosis not present

## 2014-12-17 DIAGNOSIS — M1990 Unspecified osteoarthritis, unspecified site: Secondary | ICD-10-CM | POA: Diagnosis not present

## 2014-12-17 DIAGNOSIS — R911 Solitary pulmonary nodule: Secondary | ICD-10-CM | POA: Diagnosis not present

## 2014-12-17 DIAGNOSIS — I428 Other cardiomyopathies: Secondary | ICD-10-CM

## 2014-12-17 DIAGNOSIS — I5022 Chronic systolic (congestive) heart failure: Secondary | ICD-10-CM | POA: Diagnosis not present

## 2014-12-17 MED ORDER — CARVEDILOL 6.25 MG PO TABS: 3.1250 mg | ORAL_TABLET | Freq: Two times a day (BID) | ORAL | Status: DC

## 2014-12-17 NOTE — Patient Instructions (Signed)
Decrease Carvedilol to 3.125 mg Twice daily   Your physician recommends that you schedule a follow-up appointment in: 6 weeks

## 2014-12-17 NOTE — Progress Notes (Signed)
[address]       [place] [address]             [phone]                    [name] [place] Medical Record #[id number] Date of Birth: [birth year]  Referring: Charolette Forward, MD Primary Care: Charolette Forward, MD Heart Failure Clinic: Dr Aundra Dubin  Chief Complaint:    Chief Complaint  Patient presents with  . Aortic Insuffiency    Further discuss surgery   Patient is 74 year old male withhistory significant for mild coronary artery disease cath 2007, hypertension, prediabetic, hypercholesterolemia, degenerative joint disease, COPD, chronic atrial fibrillation, treated with rate control and anticoagulants. He was seen in the [facility] ER a few days before admission to [facility] in March 2016, had chest x-ray which was negative showed signs of COPD and was started yesterday on Z-Pak without much improvement. He came to the [facility] ED for further evaluation complaining of progressive increasing shortness of breath associated with coughing for approximately 2 weeks. Patient was noted to be in A. fib with moderate ventricular response and  CHF. Patient received IV Lasix with improvement in his symptoms.  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 Cardiac catheterization and TEE were performed. Echocardiogram revealed ejection fraction of 10-15%. Because of this reason we have delayed on proceeding with prophylactic repair of his aortic root and ascending aorta.   Symptomatically his heart failure symptoms have continued to improve over the past 6 weeks. He is followed closely in the heart failure clinic he was seen this am. He denies shortness of breath or pedal edema or orthopnea. He is now back in sinus rhythm   Past Medical History  Diagnosis Date  . A-fib   . Hypertension   . Asthma   . CHF (congestive heart failure)   History of GI Bleed In 1987  Previous history of bilaterial knee  replacement, cholecystectomy, inguinal hernias repairs  Past Surgical History  Procedure Laterality Date  . Tee without cardioversion N/A 07/13/2014    Procedure: TRANSESOPHAGEAL ECHOCARDIOGRAM (TEE) WITH CARDIOVERSION;  Surgeon: Dixie Dials, MD;  Location: [place];  Service: Cardiovascular;  Laterality: N/A;  . Cardioversion N/A 07/13/2014    Procedure: CARDIOVERSION;  Surgeon: Dixie Dials, MD;  Location: [facility] ENDOSCOPY;  Service: Cardiovascular;  Laterality: N/A;  . Left and right heart catheterization with coronary angiogram N/A 07/14/2014    Procedure: LEFT AND RIGHT HEART CATHETERIZATION WITH CORONARY ANGIOGRAM;  Surgeon: Charolette Forward, MD;  Location: [facility] CATH LAB;  Service: Cardiovascular;  Laterality: N/A;  . Hernia repair    . Joint replacement      bilateral knee  . Cholecystectomy    . Cardioversion N/A 10/01/2014    Procedure: CARDIOVERSION;  Surgeon: Larey Dresser, MD;  Location: [facility] ENDOSCOPY;  Service: Cardiovascular;  Laterality: N/A;   History  Smoking status  . smoker for 4-5 years quit in 1960  Smokeless tobacco  . none   History  Alcohol Use No    History   Social History  . Marital Status: Married    Spouse Name: N/A  . Number of Children: None    Occupational History  . retired from [employer]     No Known Allergies  Current Facility-Administered Medications  Medication Dose Route Frequency Provider Last Rate Last Dose  . 0.9 % sodium chloride infusion 250 mL  Intravenous PRN Dixie Dials, MD    . 0.9 % sodium chloride infusion  Intravenous Continuous Charolette Forward, MD  Stopped at 07/15/14 0436  . acetaminophen (TYLENOL) tablet 650 mg 650 mg Oral Q4H PRN Charolette Forward, MD    . albuterol (PROVENTIL) (2.5 MG/3ML) 0.083% nebulizer solution 3 mL 3 mL Inhalation Q4H PRN Charolette Forward, MD  3 mL at 07/13/14 1500  . ALPRAZolam Duanne Moron) tablet 0.25 mg 0.25 mg  Oral BID Charolette Forward, MD  0.25 mg at 07/14/14 2239  . amiodarone (PACERONE) tablet 200 mg 200 mg Oral BID Charolette Forward, MD  200 mg at 07/14/14 2239  . apixaban (ELIQUIS) tablet 5 mg 5 mg Oral BID Charolette Forward, MD  5 mg at 07/14/14 2239  . atorvastatin (LIPITOR) tablet 20 mg 20 mg Oral q1800 Charolette Forward, MD  20 mg at 07/14/14 1700  . carvedilol (COREG) tablet 3.125 mg 3.125 mg Oral BID WC Dixie Dials, MD  3.125 mg at 07/14/14 1657  . lisinopril (PRINIVIL,ZESTRIL) tablet 5 mg 5 mg Oral Daily Charolette Forward, MD  5 mg at 07/14/14 1721  . mometasone-formoterol (DULERA) 100-5 MCG/ACT inhaler 2 puff 2 puff Inhalation BID Charolette Forward, MD  2 puff at 07/15/14 0742  . ondansetron (ZOFRAN) injection 4 mg 4 mg Intravenous Q6H PRN Charolette Forward, MD    . sodium chloride 0.9 % injection 3 mL 3 mL Intravenous Q12H Charolette Forward, MD  3 mL at 07/12/14 0949  . sodium chloride 0.9 % injection 3 mL 3 mL Intravenous PRN Charolette Forward, MD    . spironolactone (ALDACTONE) tablet 25 mg 25 mg Oral Daily Charolette Forward, MD  25 mg at 07/14/14 1011  . tiotropium (SPIRIVA) inhalation capsule 18 mcg 18 mcg Inhalation Daily Charolette Forward, MD  18 mcg at 07/15/14 9983    Prescriptions prior to admission  Medication Sig Dispense Refill Last Dose  . ADVAIR DISKUS 250-50 MCG/DOSE AEPB Inhale 1 puff into the lungs 2 (two) times daily.    07/09/2014 at Unknown time  . apixaban (ELIQUIS) 5 MG TABS tablet Take 5 mg by mouth 2 (two) times daily.   07/09/2014 at Unknown time  . atorvastatin (LIPITOR) 20 MG tablet Take 20 mg by mouth daily at 6 PM.    07/09/2014 at Unknown time  . azithromycin (ZITHROMAX) 250 MG tablet Take 250 mg by mouth daily.    07/09/2014 at Unknown time  . metoprolol succinate (TOPROL-XL) 50 MG 24 hr tablet Take 100 mg by mouth daily.    07/09/2014 at 0800  .  PROAIR HFA 108 (90 BASE) MCG/ACT inhaler Inhale 1 puff into the lungs every 4 (four) hours as needed for wheezing or shortness of breath.    07/09/2014 at Unknown time  . SPIRIVA HANDIHALER 18 MCG inhalation capsule Place 18 mcg into inhaler and inhale daily.    07/09/2014 at Unknown time  . spironolactone (ALDACTONE) 25 MG tablet Take 25 mg by mouth daily.    07/09/2014 at Unknown time    Family History: Only child , no children, father died suddenly at home at age 78 , ? MI No family history of dissection or aortic any serum   Review of Systems:    Cardiac Review of Systems: Y or N Chest Pain [ n ] Resting SOB [ y ]Exertional SOB [ y ] Orthopnea Blue.Reese ] Pedal Edema Blue.Reese ] Palpitations Blue.Reese ]Syncope [ n ] Presyncope [ y ] General Review of Systems: [Y] = yes [ ] =no Constitional: recent  weight change Vixen.Miu ]; anorexia [ ] ; fatigue Blue.Reese ]; nausea [ ] ; night sweats [ ] ; fever [n ]; or chills [ n ]  Dental: poor dentition[ y ]; Last Dentist visit: Has upper plates and lower implants  Eye : blurred vision [ n ]; diplopia [ ] ; vision changes [ ] ; Amaurosis fugax[ ] ; Resp: cough Blue.Reese ]; wheezing[ y ]; hemoptysis[ n ]; shortness of breath[ y ]; paroxysmal nocturnal dyspnea[ ] ; dyspnea on exertion[y ]; or orthopnea[y ];  GI: gallstones[ ] , vomiting[ ] ; dysphagia[ ] ; melena[ ] ; hematochezia [ ] ; heartburn[ ] ; Hx of Colonoscopy[y ]; GU: kidney stones [ ] ; hematuria[ n ]; dysuria [ ] ; nocturia[ ] ; history of obstruction [ ] ; urinary frequency [ n ]  Skin: rash, swelling[ ] ;, hair loss[ ] ; peripheral edema[ ] ; or itching[ ] ; Musculosketetal: myalgias[ ] ; joint swelling[ y ]; joint erythema[ y ]; joint pain[ ] ; back pain[ ] ; Heme/Lymph: bruising[ ] ;  bleeding[ ] ; anemia[ ] ;  Neuro: TIA[ ] ; headaches[ ] ; stroke[ ] ; vertigo[ ] ; seizures[ n ]; paresthesias[ ] ; difficulty walking[ n ]; Psych:depression[ ] ; anxiety[ ] ; Endocrine: diabetes[n ]; thyroid dysfunction[ n ]; Immunizations: Flu [n ]; Pneumococcal[ n]; Other:  Physical Exam: BP 97/54 mmHg  Pulse 48  Resp 20  Ht 6\' 2"  (1.88 m)  Wt 154 lb (69.854 kg)  BMI 19.76 kg/m2  SpO2 95%   General appearance: alert, cooperative, appears older than stated age and no distress Head: Normocephalic, without obvious abnormality, atraumatic Neck: no adenopathy, no carotid bruit, no JVD, supple, symmetrical, trachea midline and thyroid not enlarged, symmetric, no tenderness/mass/nodules Lymph nodes: Cervical, supraclavicular, and axillary nodes normal. Resp: clear to auscultation bilaterally Back: symmetric, no curvature. ROM normal. No CVA tenderness. Cardio: now regular Rhythm, ii/vi m of AI GI: soft, non-tender; bowel sounds normal; no masses, no organomegaly Extremities:  No pedal edema extremities normal, atraumatic, no cyanosis or edema and Homans sign is negative, no sign of DVT Neurologic: Grossly normal No carotid bruits Palpable dp and pt pulses, bilateral bilateral knee incisions from knee replacement   Diagnostic Studies & Laboratory data:   Recent Radiology Findings:     Nm Pet Image Initial (pi) Skull Base To Thigh  07/24/2014   CLINICAL DATA:  Initial treatment strategy for right upper lobe pulmonary nodule.  EXAM: NUCLEAR MEDICINE PET SKULL BASE TO THIGH  TECHNIQUE: 10.2 mCi F-18 FDG was injected intravenously. Full-ring PET imaging was performed from the skull base to thigh after the radiotracer. CT data was obtained and used for attenuation correction and anatomic localization.  FASTING BLOOD GLUCOSE:  Value: 109 mg/dl  COMPARISON:  CTA of the chest of 07/10/2014.  FINDINGS: NECK  No areas of abnormal  hypermetabolism.  CHEST  No hypermetabolism within the right upper lobe. The nodule detailed on the prior exam likely represented branching vessels. Example image 32 of series 6 today. There is equivocal soft tissue fullness in this area on image 72 of series 501 of the prior CT. No thoracic nodal hypermetabolism.  ABDOMEN/PELVIS  No areas of abnormal hypermetabolism.  SKELETON  Hypermetabolism involving a posterior right-sided cervical facet. Likely degenerative.  CT IMAGES PERFORMED FOR ATTENUATION CORRECTION  No cervical adenopathy.  Chest, abdomen, and pelvic findings deferred to recent diagnostic CT. No acute superimposed process. Cholecystectomy. Old granulomatous disease in the spleen. Prostatomegaly. Degenerative partial fusion of the bilateral sacroiliac joints.  IMPRESSION: No right upper lobe hypermetabolism to correspond to the right upper lobe Nodule detailed on the prior exam. This is favored to  represent a branching vessel, when correlated with today's CT. As there is equivocal soft tissue fullness in this area on thin slice imaging of the prior diagnostic CTA, followup with chest CT at 6 months should be considered.   Electronically Signed   By: Abigail Miyamoto M.D.   On: 07/24/2014 15:57   Dg Chest Portable 1 View  07/10/2014   CLINICAL DATA:  Shortness of breath and chest discomfort.  EXAM: PORTABLE CHEST - 1 VIEW  COMPARISON:  Remote frontal and lateral views 10/23/2007  FINDINGS: The heart is enlarged. Mild prominence of central pulmonary vasculature without pulmonary edema. Lungs remain hyperinflated. No consolidation, pleural effusion, or pneumothorax. Minimal atelectasis or scarring at the left lung base.  IMPRESSION: 1. Mild cardiomegaly. 2. Hyperinflation with left basilar atelectasis.   Electronically Signed   By: Jeb Levering M.D.   On: 07/10/2014 02:25   Ct Angio Abd/pel W/ And/or W/o  07/10/2014   CLINICAL DATA:  74 year old male with increasing shortness of breath for the past 2  weeks, cough, chest tightness and shortness of breath, which worsens when lying flat. History of atrial fibrillation. History of thoracic aortic aneurysm.  EXAM: CT ANGIOGRAPHY CHEST, ABDOMEN AND PELVIS  TECHNIQUE: Multidetector CT imaging through the chest, abdomen and pelvis was performed using the standard protocol during bolus administration of intravenous contrast. Multiplanar reconstructed images and MIPs were obtained and reviewed to evaluate the vascular anatomy.  CONTRAST:  184mL OMNIPAQUE IOHEXOL 350 MG/ML SOLN  COMPARISON:  None.  FINDINGS: CTA CHEST FINDINGS  Mediastinum/Lymph Nodes: Aneurysmal dilatation of the aortic root which measures 3.2 cm in diameter at the annulus, but 5.7 cm in diameter at the level of the sinuses of Valsalva. The sino-tubular junction is effaced. The thoracic aortic arch is normal in caliber at 2.8 cm. The isthmus of the thoracic aorta is mildly ectatic measuring 4 cm in diameter. The descending thoracic aorta is normal in caliber at 3 cm in diameter. No evidence of thoracic aortic dissection. Heart size is mildly enlarged. There is no significant pericardial fluid, thickening or pericardial calcification. There is atherosclerosis of the thoracic aorta, the great vessels of the mediastinum and the coronary arteries, including calcified atherosclerotic plaque in the left main and left anterior descending coronary arteries. Multiple borderline enlarged and mildly enlarged mediastinal and right hilar lymph nodes, largest of which is in the low right paratracheal nodal station measuring 14 mm in short axis. Multiple densely calcified right hilar and mediastinal lymph nodes, presumably from old granulomatous disease. Esophagus is normal in appearance. Multiple borderline enlarged and mildly enlarged left axillary lymph nodes measuring up to 10 mm in short axis.  Lungs/Pleura: Large calcified granuloma in the posterior aspect of the right lower lobe with adjacent scarring. 12 x 7 mm  right upper lobe pulmonary nodule (image 29 of series 506). No acute consolidative airspace disease. No pleural effusions. Diffuse subpleural bullae are noted in the apices of the lungs bilaterally. Small left-sided Bochdalek's hernia.  Musculoskeletal/Soft Tissues: There are no aggressive appearing lytic or blastic lesions noted in the visualized portions of the skeleton.  Review of the MIP images confirms the above findings.  CTA ABDOMEN AND PELVIS FINDINGS  Hepatobiliary: Status post cholecystectomy. 8 mm low attenuation lesion in segment 6 of the liver (image 180 of series 501), too small to characterize, but statistically likely a tiny cyst. No other suspicious appearing hepatic lesions. No intra or extrahepatic biliary ductal dilatation.  Pancreas: Unremarkable.  Spleen: Multiple calcifications in the spleen,  and irregular splenic contour, which could relate to remote trauma or prior splenic infarcts.  Adrenals/Urinary Tract: Bilateral kidneys and bilateral adrenal glands are normal in appearance. No hydroureteronephrosis. Urinary bladder is normal in appearance.  Stomach/Bowel: Who normal appearance of the stomach. No pathologic dilatation of small bowel or colon. Normal appendix.  Vascular/Lymphatic: Atherosclerosis throughout the abdominal and pelvic vasculature. Aneurysmal dilatation of the common iliac arteries bilaterally which measure up to 1.6 cm in diameter bilaterally. No evidence of dissection. No lymphadenopathy noted in the abdomen or pelvis.  Reproductive: Prostate gland is enlarged and heterogeneous in appearance measuring 6.3 x 4.1 cm. Seminal vesicles are unremarkable in appearance.  Other: No significant volume of ascites.  No pneumoperitoneum.  Musculoskeletal: There are no aggressive appearing lytic or blastic lesions noted in the visualized portions of the skeleton.  Review of the MIP images confirms the above findings.  IMPRESSION: 1. Aneurysmal dilatation of the aortic root, which  measures up to 5.7 cm in diameter at the level of the sinuses of Valsalva. There is effacement of the sino-tubular junction. This is commonly seen in the setting of Marfan syndrome. No evidence of aortic dissection at this time. There is also some ectasia at of the isthmus of the thoracic aorta which measures up to 4 cm in diameter. 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 2. 12 x 7 mm right upper lobe pulmonary nodule (image 29 of series 506). Followup evaluation with PET-CT is recommended in the near future to evaluate for potential malignancy. 3. Left main and left anterior descending coronary artery disease. Assessment for potential risk factor modification, dietary therapy or pharmacologic therapy may be warranted, if clinically indicated. 4. Mild cardiomegaly. 28. Sequela of old granulomatous disease, as above. Multiple borderline enlarged and mildly enlarged mediastinal and right hilar lymph nodes that are noncalcified are also noted, as well as borderline enlarged and minimally enlarged left axillary lymph nodes. These are nonspecific, but clinical correlation to exclude the possibility of underlying lymphoproliferative disorder may be appropriate. 6. Additional incidental findings, as above.   Electronically Signed   By: Vinnie Langton M.D.   On: 07/10/2014 21:47   Ct Angio Chest Pe W/cm &/or Wo Cm  07/10/2014 CLINICAL DATA: 74 year old male with increasing shortness of breath for the past 2 weeks, cough, chest tightness and shortness of breath, which worsens when lying flat. History of atrial fibrillation. History of thoracic aortic aneurysm. EXAM: CT ANGIOGRAPHY CHEST, ABDOMEN AND PELVIS TECHNIQUE: Multidetector CT imaging through the chest, abdomen and pelvis was performed using the standard protocol during bolus administration of intravenous contrast. Multiplanar reconstructed images and MIPs were obtained  and reviewed to evaluate the vascular anatomy. CONTRAST: 162mL OMNIPAQUE IOHEXOL 350 MG/ML SOLN COMPARISON: None. FINDINGS: CTA CHEST FINDINGS Mediastinum/Lymph Nodes: Aneurysmal dilatation of the aortic root which measures 3.2 cm in diameter at the annulus, but 5.7 cm in diameter at the level of the sinuses of Valsalva. The sino-tubular junction is effaced. The thoracic aortic arch is normal in caliber at 2.8 cm. The isthmus of the thoracic aorta is mildly ectatic measuring 4 cm in diameter. The descending thoracic aorta is normal in caliber at 3 cm in diameter. No evidence of thoracic aortic dissection. Heart size is mildly enlarged. There is no significant pericardial fluid, thickening or pericardial calcification. There is atherosclerosis of the thoracic aorta, the great vessels of the mediastinum and the coronary arteries, including calcified atherosclerotic plaque  in the left main and left anterior descending coronary arteries. Multiple borderline enlarged and mildly enlarged mediastinal and right hilar lymph nodes, largest of which is in the low right paratracheal nodal station measuring 14 mm in short axis. Multiple densely calcified right hilar and mediastinal lymph nodes, presumably from old granulomatous disease. Esophagus is normal in appearance. Multiple borderline enlarged and mildly enlarged left axillary lymph nodes measuring up to 10 mm in short axis. Lungs/Pleura: Large calcified granuloma in the posterior aspect of the right lower lobe with adjacent scarring. 12 x 7 mm right upper lobe pulmonary nodule (image 29 of series 506). No acute consolidative airspace disease. No pleural effusions. Diffuse subpleural bullae are noted in the apices of the lungs bilaterally. Small left-sided Bochdalek's hernia. Musculoskeletal/Soft Tissues: There are no aggressive appearing lytic or blastic lesions noted in the visualized portions of the skeleton. Review of the MIP images confirms the above  findings. CTA ABDOMEN AND PELVIS FINDINGS Hepatobiliary: Status post cholecystectomy. 8 mm low attenuation lesion in segment 6 of the liver (image 180 of series 501), too small to characterize, but statistically likely a tiny cyst. No other suspicious appearing hepatic lesions. No intra or extrahepatic biliary ductal dilatation. Pancreas: Unremarkable. Spleen: Multiple calcifications in the spleen, and irregular splenic contour, which could relate to remote trauma or prior splenic infarcts. Adrenals/Urinary Tract: Bilateral kidneys and bilateral adrenal glands are normal in appearance. No hydroureteronephrosis. Urinary bladder is normal in appearance. Stomach/Bowel: Who normal appearance of the stomach. No pathologic dilatation of small bowel or colon. Normal appendix. Vascular/Lymphatic: Atherosclerosis throughout the abdominal and pelvic vasculature. Aneurysmal dilatation of the common iliac arteries bilaterally which measure up to 1.6 cm in diameter bilaterally. No evidence of dissection. No lymphadenopathy noted in the abdomen or pelvis. Reproductive: Prostate gland is enlarged and heterogeneous in appearance measuring 6.3 x 4.1 cm. Seminal vesicles are unremarkable in appearance. Other: No significant volume of ascites. No pneumoperitoneum. Musculoskeletal: There are no aggressive appearing lytic or blastic lesions noted in the visualized portions of the skeleton. Review of the MIP images confirms the above findings. IMPRESSION: 1. Aneurysmal dilatation of the aortic root, which measures up to 5.7 cm in diameter at the level of the sinuses of Valsalva. There is effacement of the sino-tubular junction. This is commonly seen in the setting of Marfan syndrome. No evidence of aortic dissection at this time. There is also some ectasia at of the isthmus of the thoracic aorta which measures up to 4 cm in diameter. 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 2. 12 x 7 mm right upper lobe pulmonary nodule (image 29 of series 506). Followup evaluation with PET-CT is recommended in the near future to evaluate for potential malignancy. 3. Left main and left anterior descending coronary artery disease. Assessment for potential risk factor modification, dietary therapy or pharmacologic therapy may be warranted, if clinically indicated. 4. Mild cardiomegaly. 75. Sequela of old granulomatous disease, as above. Multiple borderline enlarged and mildly enlarged mediastinal and right hilar lymph nodes that are noncalcified are also noted, as well as borderline enlarged and minimally enlarged left axillary lymph nodes. These are nonspecific, but clinical correlation to exclude the possibility of underlying lymphoproliferative disorder may be appropriate. 6. Additional incidental findings, as above. Electronically Signed By: Vinnie Langton M.D. On: 07/10/2014 21:47   Dg Chest Portable 1 View  07/10/2014 CLINICAL DATA: Shortness of breath and chest discomfort. EXAM: PORTABLE CHEST -  1 VIEW COMPARISON: Remote frontal and lateral views 10/23/2007 FINDINGS: The heart is enlarged. Mild prominence of central pulmonary vasculature without pulmonary edema. Lungs remain hyperinflated. No consolidation, pleural effusion, or pneumothorax. Minimal atelectasis or scarring at the left lung base. IMPRESSION: 1. Mild cardiomegaly. 2. Hyperinflation with left basilar atelectasis. Electronically Signed By: Jeb Levering M.D. On: 07/10/2014 02:25    I have independently reviewed the above radiologic studies.  Cardiac Cath:FINDINGS: LV showed LV was moderately enlarged. There was global hypokinesia. EF of approximately 20% to 25%. Left main was very short, which was patent. LAD was patent. Diagonal 1 and 2 were patent. Left circumflex was patent. OM-1 was small, which was patent. OM-2 was moderate sized, which was  patent. RCA was patent. PDA and PLV branches were patent.  The patient tolerated the procedure well. There were no complications. The patient was transferred to recovery room in stable condition  PLAN: To maximize beta-blockers and ACE inhibitors. We will start Eliquis tomorrow and possibly discharge tomorrow. The patient will be scheduled for PET scan as an outpatient and open heart surgery for aortic root replacement, possibly aortic valve as an outpatient.   Allegra Lai. Terrence Dupont, M.D.  I have independently reviewed the above cath films and reviewed the findings with the patient .   Recent Lab Findings:  Recent Labs    Lab Results  Component Value Date   WBC 4.8 07/15/2014   HGB 14.4 07/15/2014   HCT 43.4 07/15/2014   PLT 168 07/15/2014   GLUCOSE 113* 07/13/2014   ALT 149* 07/10/2014   AST 79* 07/10/2014   NA 138 07/13/2014   K 4.0 07/13/2014   CL 106 07/13/2014   CREATININE 0.88 07/13/2014   BUN 15 07/13/2014   CO2 24 07/13/2014   INR 1.47 07/14/2014     RecentCardiac CATH: 06/2014  FINDINGS: LV showed LV was moderately enlarged. There was global hypokinesia. EF of approximately 20% to 25%. Left main was very short, which was patent. LAD was patent. Diagonal 1 and 2 were patent. Left circumflex was patent. OM-1 was small, which was patent. OM-2 was moderate sized, which was patent. RCA was patent. PDA and PLV branches were patent.  The Swan findings were RA pressure was 3 mmHg, RV was 26/1, PA was 24/7, wedge pressure was 16. Cardiac output by Fick was 4.10 and by thermodilution it was 4.65.  REPEAT ECHO: 07/2014: ------------------------------------------------------------------- LV EF: 15% -  20%  ------------------------------------------------------------------- Indications:   CHF - 428.0.  ------------------------------------------------------------------- History:   PMH:  Congestive heart failure.  ------------------------------------------------------------------- Study Conclusions  - Left ventricle: The cavity size was moderately dilated. The estimated ejection fraction was in the range of 15% to 20%. Wall motion was normal; there were no regional wall motion abnormalities. - Aortic valve: There was mild regurgitation. - Left atrium: The atrium was mildly dilated. - Atrial septum: No defect or patent foramen ovale was identified. - Pulmonary arteries: Systolic pressure was mildly increased.  Echocardiography. M-mode, complete 2D, spectral Doppler, and color Doppler. Birthdate: Patient birthdate: [birth year]. Age: Patient is 74 yr old. Sex: Gender: male.  BMI: 22.9 kg/m^2. Blood pressure:   114/72 Patient status: Outpatient. Study date: Study date: 08/07/2014. Study time: 10:02 AM. Location: Echo laboratory.  -------------------------------------------------------------------  ------------------------------------------------------------------- Left ventricle: The cavity size was moderately dilated. The estimated ejection fraction was in the range of 15% to 20%. Wall motion was normal; there were no regional wall motion abnormalities.  ------------------------------------------------------------------- Aortic valve:  Structurally normal valve.  Cusp separation  was normal. Doppler: Transvalvular velocity was within the normal range. There was no stenosis. There was mild regurgitation.  ------------------------------------------------------------------- Aorta: The aorta was moderately dilated. There was moderate dilation of the sinus(es) of Valsalva. Aortic root: The aortic root was moderately dilated. Ascending aorta: The ascending aorta was moderately dilated.  ------------------------------------------------------------------- Mitral valve:  Structurally normal valve.  Leaflet separation was normal.  Doppler: Transvalvular velocity was within the normal range. There was no evidence for stenosis. There was trivial regurgitation.  ------------------------------------------------------------------- Left atrium: The atrium was mildly dilated.  ------------------------------------------------------------------- Atrial septum: No defect or patent foramen ovale was identified.  ------------------------------------------------------------------- Right ventricle: The cavity size was normal. Wall thickness was normal. Systolic function was normal.  ------------------------------------------------------------------- Pulmonic valve:  Doppler: There was no significant regurgitation.  ------------------------------------------------------------------- Tricuspid valve:  Structurally normal valve.  Leaflet separation was normal. Doppler: Transvalvular velocity was within the normal range. There was mild regurgitation.  ------------------------------------------------------------------- Pulmonary artery:  Systolic pressure was mildly increased.  ------------------------------------------------------------------- Right atrium: The atrium was normal in size.  ------------------------------------------------------------------- Pericardium: There was no pericardial effusion.  ------------------------------------------------------------------- Post procedure conclusions Ascending Aorta:  - The aorta was moderately dilated.  ------------------------------------------------------------------- Measurements  Left ventricle              Value    Reference LV ID, ED, PLAX chordal     (H)   70.7 mm   43 - 52 LV ID, ES, PLAX chordal     (H)   67.5 mm   23 - 38 LV fx shortening, PLAX chordal  (L)   5   %   >=29 LV PW thickness, ED           8.12 mm   --------- IVS/LV PW ratio, ED           1.16      <=1.3 LV e&', lateral              8.49 cm/s  --------- LV E/e&', lateral             6.53     --------- LV e&', medial              6.09 cm/s  --------- LV E/e&', medial             9.1     --------- LV e&', average              7.29 cm/s  --------- LV E/e&', average             7.6     ---------  Ventricular septum            Value    Reference IVS thickness, ED            9.38 mm   ---------  Aortic valve               Value    Reference Aortic regurg pressure half-time     710  ms   ---------  Aorta                  Value    Reference Aortic root ID, ED            49  mm   ---------  Left atrium               Value    Reference LA ID, A-P, ES              44  mm   --------- LA ID/bsa, A-P              2.14 cm/m^2 <=2.2 LA volume, S               93.3 ml   --------- LA volume/bsa, S             45.5 ml/m^2 --------- LA volume, ES, 1-p A4C          74.6 ml   --------- LA volume/bsa, ES, 1-p A4C        36.4 ml/m^2 --------- LA volume, ES, 1-p A2C          107  ml   --------- LA volume/bsa, ES, 1-p A2C        52.2 ml/m^2 ---------  Mitral valve               Value    Reference Mitral E-wave peak velocity       55.4 cm/s  --------- Mitral A-wave peak velocity       39.8 cm/s  --------- Mitral deceleration time     (H)   246  ms   150 - 230 Mitral E/A ratio, peak          1.4     ---------  Pulmonary arteries            Value    Reference PA pressure, S, DP            20  mm Hg <=30  Tricuspid valve             Value    Reference Tricuspid  regurg peak velocity      209  cm/s  --------- Tricuspid peak RV-RA gradient      17  mm Hg ---------  Systemic veins              Value    Reference Estimated CVP              3   mm Hg ---------  Right ventricle             Value    Reference RV pressure, S, DP            20  mm Hg <=30 RV s&', lateral, S            6.42 cm/s  ---------  Legend: (L) and (H) mark values outside specified reference range.  ------------------------------------------------------------------- Prepared and Electronically Authenticated by  Charolette Forward, MD 2016-04-08T13:05:59  ------------------------------------------------------------------- PFT's 07/12/2014 21.5 57% DLCO 25.45 67% I does get Response to bronchodilator Airtrapping Mild Diffusion Defect  Aortic Size Index=     5.7    /Body surface area is 1.91 meters squared. 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 No evidence of aortic dissection at this time. 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 Now in sinus at  controlled rate  5/Left main and left anterior descending coronary artery calcification on CT but without significant CAD at cath  6/ AFib-  on Elliquis  7/ COPD- Moderately severe Obstructive Airways Disease, Response to bronchodilator, Airtrapping, Mild Diffusion Defect  Recommend:   With depressed lv function out of proportion to CAD or degree of AI suspect af/tachycaria mediated lv dysfunction, symptoms of HF  have improved     Aggressive treatment of heart failure before proceeding with root replacement would be of benefit.  Per Dr Aundra Dubin: Aortic root size large enough to warrant prophylactic repair, however would like to aggressively treat his cardiomyopathy prior to taking him for surgery. Will get MRA chest to reassess aorta with cardiac MRI in 9/16    Plan to see patient back sept 8 or 15 after cardiac mra, Then decide on timing based on lv function         Grace Isaac MD      [address] [place] [address] Office [phone]   Beeper [phone]  12/17/2014 1:34 PM

## 2014-12-18 DIAGNOSIS — I480 Paroxysmal atrial fibrillation: Secondary | ICD-10-CM | POA: Insufficient documentation

## 2014-12-18 NOTE — Progress Notes (Signed)
Patient ID: [name], male   DOB: 12-19-1940, 74 y.o.   MRN: [id number]  74 yo with history of HTN and COPD was admitted in 3/16 with atrial fibrillation/RVR and dyspnea.  Echo showed low EF (15-20% range).  He had TEE-guided DCCV to NSR and cardiac catheterization.  Cath showed no obstructive coronary disease (nonischemic cardiomyopathy).  Echo showed dilated aortic root.  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  He has seen Dr Servando Snare for evaluation for aortic root replacement.   At initial appointment, patient was noted to be back in atrial fibrillation.  He was put on amiodarone and I took him for DCCV in 6/16.  DCCV was unsuccessful, but I left him on amiodarone for the time being.  He later converted back to NSR and remains in NSR.  However, today he is quite bradycardic with soft blood pressure.    He is watching his sodium intake.  Weight is stable.  He has occasional fatigue with heavy exertion but denies significant dyspnea.  He walks for exercise up to a mile.  No chest pain.  No falls/syncope.  Occasional mild lightheadedness when he first stands up.  No orthopnea/PND.  No family history of dissection or aneurysms.  Aortic valve is trileaflet.  No melena or BRBPR on apixaban. He saw pulmonary for evaluation of abnormal PFTs and pre-op assessment.  Dr Lake Bells thought that surgery would be ok.    Labs (3/16): HCT 43.4, K 4, creatinine 0.88 Labs (08/2014): K 5.1 => 4.6, Creatinine 1.12 => 1.36, Hgb 15.0  Labs (7/16): K 4.6, creatinine 1.07, TSH normal, free T4/free T3 normal, LFTs normal, HCT 35.9  ECG: sinus brady at 46, 1st degree AVB, RBBB, LAFB  PMH: 1. HTN 2. Hyperlipidemia 3. Osteoarthritis 4. Persistent atrial fibrillation: Atrial fibrillation first noted in 3/16.  TEE-guided DCCV in 3/16.  Noted to be back in atrial fibrillation 5/16 CHF appt. DCCV 6/16 was unsuccessful but he was in NSR at 7/16 appt.  5.  Right upper lobe lung nodule: PET negative.  6. COPD: PFTs (3/16) with FVC 75%, FEV1 57%, ratio 75%, TLC 101%, DLCO 67%.  Moderate to severe obstructive disease.  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  CTA chest (3/16) with 5.7 cm aortic root with effacement of STJ, no dissection. The aortic valve is trileaflet.  8. Nonischemic cardiomyopathy: Echo (4/16) with EF 15-20%, moderate LV dilation, mild AI.  RHC/LHC (3/16) with mean RA 3, PA 24/7, mean PCWP 16, CI 4.1, EF 20-25%, no significant CAD.   9. 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  Moderate by TEE in 3/16, mild by TTE in 4/16.  10. Sinus bradycardia  SH: Separated from wife, lives in [place], has not smoked for many years but has been around second hand smoke a lot, no ETOH.  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   FH: Father with MI at 28, mother with "heart trouble."   ROS: All systems reviewed and negative except as per HPI.   Current Outpatient Prescriptions  Medication Sig Dispense Refill  . ADVAIR DISKUS 250-50 MCG/DOSE AEPB Inhale 1 puff into the lungs 2 (two) times daily.     Marland Kitchen ALPRAZolam (XANAX) 0.25 MG tablet Take 0.25 mg by mouth at bedtime as needed for anxiety or sleep.     Marland Kitchen amiodarone (PACERONE) 200 MG tablet Take 1 tablet (  200 mg total) by mouth daily. 30 tablet 3  . apixaban (ELIQUIS) 5 MG TABS tablet Take 5 mg by mouth 2 (two) times daily.    Marland Kitchen atorvastatin (LIPITOR) 20 MG tablet Take 20 mg by mouth daily at 6 PM.     . carvedilol (COREG) 6.25 MG tablet Take 0.5 tablets (3.125 mg total) by mouth 2 (two) times daily with a meal. 90 tablet 3  . lisinopril (PRINIVIL,ZESTRIL) 5 MG tablet Take 1 tablet (5 mg total) by mouth 2 (two) times daily. 60 tablet 3  . Multiple Vitamin (MULTIVITAMIN) tablet Take 1 tablet by mouth daily.    Marland Kitchen PROAIR HFA 108 (90 BASE) MCG/ACT inhaler Inhale 1 puff into the lungs every 4 (four) hours as needed for wheezing  or shortness of breath.     . SPIRIVA HANDIHALER 18 MCG inhalation capsule Place 18 mcg into inhaler and inhale daily.     Marland Kitchen spironolactone (ALDACTONE) 25 MG tablet Take 0.5 tablets (12.5 mg total) by mouth daily. 15 tablet 3   No current facility-administered medications for this encounter.   BP 88/42 mmHg  Pulse 46  Wt 154 lb 6.4 oz (70.035 kg)  SpO2 100%  General: NAD Neck: No JVD flat , no thyromegaly or thyroid nodule.  Lungs: Mildly prolonged expiratory phase.  CV: Distant heart sounds.  Heart regular, bradycardic, S1/S2, no S3/S4, no murmur.  No peripheral edema.  No carotid bruit.  Normal pedal pulses.  Abdomen: Soft, nontender, no hepatosplenomegaly, no distention.  Skin: Intact without lesions or rashes.  Neurologic: Alert and oriented x 3.  Psych: Normal affect. Extremities: No clubbing or cyanosis.  HEENT: Normal.  MSK: Joints not hypermobile.   Assessment/Plan: 1. Chronic systolic CHF: Nonischemic cardiomyopathy.  4/16 echo with EF 15-20%.  Possible tachycardia-mediated cardiomyopathy but no improvement with NSR/rate control when echo was done in 4/16.  Coronary angiography 3/16 without significant CAD.  TEE in 3/16 showed moderate AI but echo 4/16 showed mild AI, so doubt AI contributes significantly to cardiomyopathy. Euvolemic on exam. - He remains in NSR today but HR/BP are both low.  I will decrease Coreg to 3.125 mg bid.  - Continue spironolactone 12.5 mg daily and lisinopril 5 mg bid.   - Will obtain cardiac MRI/MRA chest in 9/16.  This will allow [place] to look for any evidence for myocarditis or infiltrative disease.  If EF remains low, will need ICD.  Not good candidate for CRT with RBBB.  - Need to send SPEP/UPEP for completeness.  2. Atrial fibrillation: Paroxysmal.  First identified in 3/16.  He is now on Eliquis and amiodarone 200 mg daily.  He has been back in NSR at last 2 appts.   - Continue amiodarone 200 mg daily.  LFTs/TSH normal.  He has had some light  sensitivity and will see his eye doctor for an exam. - Continue Eliquis.  3. Aortic root dilation: 5.7 cm aortic root with STJ effacement on CTA chest.  No dissection.  Cause uncertain. Aortic valve is trileaflet, BP is controlled.  He is relatively tall and thin, but not marfanoid, per se.  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  Aortic root size large enough to warrant prophylactic repair, however cardiomyopathy is a concern.  Will get MRA chest to reassess aorta with cardiac MRI in 9/16.  4. 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  Moderate by 3/16 TEE, mild by 4/16 TTE.   5. COPD: Moderately severe obstructive lung disease.  Has not smoked for  years.  Had second hand smoke exposure. Seen by pulmonary, thought to have stable lung disease that would not pose significantly increased surgical risk.   [name] 12/18/2014

## 2014-12-30 DIAGNOSIS — M810 Age-related osteoporosis without current pathological fracture: Secondary | ICD-10-CM | POA: Diagnosis not present

## 2014-12-30 DIAGNOSIS — I10 Essential (primary) hypertension: Secondary | ICD-10-CM | POA: Diagnosis not present

## 2014-12-30 DIAGNOSIS — I5020 Unspecified systolic (congestive) heart failure: Secondary | ICD-10-CM | POA: Diagnosis not present

## 2014-12-30 DIAGNOSIS — E785 Hyperlipidemia, unspecified: Secondary | ICD-10-CM | POA: Diagnosis not present

## 2014-12-30 DIAGNOSIS — I480 Paroxysmal atrial fibrillation: Secondary | ICD-10-CM | POA: Diagnosis not present

## 2015-01-06 ENCOUNTER — Ambulatory Visit (HOSPITAL_COMMUNITY)
Admission: RE | Admit: 2015-01-06 | Discharge: 2015-01-06 | Disposition: A | Discharge status: Home / Self Care | Payer: Medicare Other | Source: Ambulatory Visit | Attending: Cardiology | Admitting: Cardiology

## 2015-01-06 ENCOUNTER — Other Ambulatory Visit (HOSPITAL_COMMUNITY): Payer: Self-pay | Admitting: Cardiology

## 2015-01-06 ENCOUNTER — Other Ambulatory Visit: Payer: Self-pay | Admitting: Cardiology

## 2015-01-06 DIAGNOSIS — I255 Ischemic cardiomyopathy: Secondary | ICD-10-CM | POA: Insufficient documentation

## 2015-01-06 DIAGNOSIS — Z87821 Personal history of retained foreign body fully removed: Secondary | ICD-10-CM

## 2015-01-06 DIAGNOSIS — I712 Thoracic aortic aneurysm, without rupture: Secondary | ICD-10-CM | POA: Diagnosis not present

## 2015-01-06 DIAGNOSIS — Z01818 Encounter for other preprocedural examination: Secondary | ICD-10-CM | POA: Diagnosis not present

## 2015-01-06 DIAGNOSIS — I77819 Aortic ectasia, unspecified site: Secondary | ICD-10-CM | POA: Diagnosis present

## 2015-01-06 LAB — CREATININE, SERUM
CREATININE: 1.21 mg/dL (ref 0.61–1.24)
GFR calc non Af Amer: 58 mL/min — ABNORMAL LOW (ref >60)

## 2015-01-06 MED ORDER — GADOBENATE DIMEGLUMINE 529 MG/ML IV SOLN
30.0000 mL | Freq: Once | INTRAVENOUS | Status: AC
  Administered 2015-01-06: 28 mL via INTRAVENOUS

## 2015-01-07 ENCOUNTER — Other Ambulatory Visit (HOSPITAL_COMMUNITY): Payer: Self-pay | Admitting: Cardiology

## 2015-01-14 ENCOUNTER — Ambulatory Visit (INDEPENDENT_AMBULATORY_CARE_PROVIDER_SITE_OTHER): Payer: Medicare Other | Admitting: Cardiothoracic Surgery

## 2015-01-14 ENCOUNTER — Encounter: Payer: Self-pay | Admitting: Cardiothoracic Surgery

## 2015-01-14 VITALS — BP 106/57 | HR 61 | Resp 16 | Ht 74.0 in | Wt 153.0 lb

## 2015-01-14 DIAGNOSIS — I7121 Aneurysm of the ascending aorta, without rupture: Secondary | ICD-10-CM

## 2015-01-14 DIAGNOSIS — I428 Other cardiomyopathies: Secondary | ICD-10-CM

## 2015-01-14 DIAGNOSIS — R911 Solitary pulmonary nodule: Secondary | ICD-10-CM

## 2015-01-14 DIAGNOSIS — I482 Chronic atrial fibrillation, unspecified: Secondary | ICD-10-CM

## 2015-01-14 DIAGNOSIS — I719 Aortic aneurysm of unspecified site, without rupture: Secondary | ICD-10-CM | POA: Diagnosis not present

## 2015-01-14 DIAGNOSIS — I351 Nonrheumatic aortic (valve) insufficiency: Secondary | ICD-10-CM | POA: Diagnosis not present

## 2015-01-14 DIAGNOSIS — I2510 Atherosclerotic heart disease of native coronary artery without angina pectoris: Secondary | ICD-10-CM | POA: Diagnosis not present

## 2015-01-14 DIAGNOSIS — I255 Ischemic cardiomyopathy: Secondary | ICD-10-CM | POA: Diagnosis not present

## 2015-01-14 DIAGNOSIS — I429 Cardiomyopathy, unspecified: Secondary | ICD-10-CM

## 2015-01-14 NOTE — Progress Notes (Signed)
[address]       [place] [address]             [phone]                    [name] [place] Medical Record #[id number] Date of Birth: 1940/08/03  Referring: Charolette Forward, MD Primary Care: Charolette Forward, MD Heart Failure Clinic: Dr Aundra Dubin  Chief Complaint:    Chief Complaint  Patient presents with  . Follow-up    with MRA CHEST and MRA CARD MORPH   Patient is 74 year old male withhistory significant for mild coronary artery disease cath 2007, hypertension, prediabetic, hypercholesterolemia, degenerative joint disease, COPD, chronic atrial fibrillation, treated with rate control and anticoagulants. He was seen in the [facility] ER a few days before admission to [facility] in March 2016, had chest x-ray which was negative showed signs of COPD and was started yesterday on Z-Pak without much improvement. He came to the [facility] ED for further evaluation complaining of progressive increasing shortness of breath associated with coughing for approximately 2 weeks. Patient was noted to be in A. fib with moderate ventricular response and  CHF. Patient received IV Lasix with improvement in his symptoms.  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 Cardiac catheterization and TEE were performed. Echocardiogram revealed ejection fraction of 10-15%. Because of this reason we have delayed on proceeding with prophylactic repair of his aortic root and ascending aorta.   Symptomatically his heart failure symptoms have continued to improve with aggressive treatment. He is followed closely in the heart failure clinic he was seen this am. He denies shortness of breath or pedal edema or orthopnea.   He is now back in sinus rhythm.  Cardiac MRI was obtained to evaluate his myocardium myocardial function and aortic root.  From a symptomatic standpoint the patient's much improved from when he was first seen back in March  2016   Past Medical History  Diagnosis Date  . A-fib   . Hypertension   . Asthma   . CHF (congestive heart failure)   History of GI Bleed In 1987  Previous history of bilaterial knee replacement, cholecystectomy, inguinal hernias repairs  Past Surgical History  Procedure Laterality Date  . Tee without cardioversion N/A 07/13/2014    Procedure: TRANSESOPHAGEAL ECHOCARDIOGRAM (TEE) WITH CARDIOVERSION;  Surgeon: Dixie Dials, MD;  Location: [place];  Service: Cardiovascular;  Laterality: N/A;  . Cardioversion N/A 07/13/2014    Procedure: CARDIOVERSION;  Surgeon: Dixie Dials, MD;  Location: [facility] ENDOSCOPY;  Service: Cardiovascular;  Laterality: N/A;  . Left and right heart catheterization with coronary angiogram N/A 07/14/2014    Procedure: LEFT AND RIGHT HEART CATHETERIZATION WITH CORONARY ANGIOGRAM;  Surgeon: Charolette Forward, MD;  Location: [facility] CATH LAB;  Service: Cardiovascular;  Laterality: N/A;  . Hernia repair    . Joint replacement      bilateral knee  . Cholecystectomy    . Cardioversion N/A 10/01/2014    Procedure: CARDIOVERSION;  Surgeon: Larey Dresser, MD;  Location: [facility] ENDOSCOPY;  Service: Cardiovascular;  Laterality: N/A;   History  Smoking status  . smoker for 4-5 years quit in 1960  Smokeless tobacco  . none   History  Alcohol Use No    History   Social History  . Marital Status: Married    Spouse Name: N/A  . Number of Children: None    Occupational History  .  retired from [employer]     No Known Allergies  Current Facility-Administered Medications  Medication Dose Route Frequency Provider Last Rate Last Dose  . 0.9 % sodium chloride infusion 250 mL Intravenous PRN Dixie Dials, MD    . 0.9 % sodium chloride infusion  Intravenous Continuous Charolette Forward, MD  Stopped at 07/15/14 0436  . acetaminophen (TYLENOL) tablet 650 mg 650 mg Oral Q4H PRN Charolette Forward, MD    . albuterol (PROVENTIL) (2.5 MG/3ML) 0.083% nebulizer solution 3 mL 3 mL Inhalation Q4H PRN Charolette Forward, MD  3 mL at 07/13/14 1500  . ALPRAZolam Duanne Moron) tablet 0.25 mg 0.25 mg Oral BID Charolette Forward, MD  0.25 mg at 07/14/14 2239  . amiodarone (PACERONE) tablet 200 mg 200 mg Oral BID Charolette Forward, MD  200 mg at 07/14/14 2239  . apixaban (ELIQUIS) tablet 5 mg 5 mg Oral BID Charolette Forward, MD  5 mg at 07/14/14 2239  . atorvastatin (LIPITOR) tablet 20 mg 20 mg Oral q1800 Charolette Forward, MD  20 mg at 07/14/14 1700  . carvedilol (COREG) tablet 3.125 mg 3.125 mg Oral BID WC Dixie Dials, MD  3.125 mg at 07/14/14 1657  . lisinopril (PRINIVIL,ZESTRIL) tablet 5 mg 5 mg Oral Daily Charolette Forward, MD  5 mg at 07/14/14 1721  . mometasone-formoterol (DULERA) 100-5 MCG/ACT inhaler 2 puff 2 puff Inhalation BID Charolette Forward, MD  2 puff at 07/15/14 0742  . ondansetron (ZOFRAN) injection 4 mg 4 mg Intravenous Q6H PRN Charolette Forward, MD    . sodium chloride 0.9 % injection 3 mL 3 mL Intravenous Q12H Charolette Forward, MD  3 mL at 07/12/14 0949  . sodium chloride 0.9 % injection 3 mL 3 mL Intravenous PRN Charolette Forward, MD    . spironolactone (ALDACTONE) tablet 25 mg 25 mg Oral Daily Charolette Forward, MD  25 mg at 07/14/14 1011  . tiotropium (SPIRIVA) inhalation capsule 18 mcg 18 mcg Inhalation Daily Charolette Forward, MD  18 mcg at 07/15/14 3419    Prescriptions prior to admission  Medication Sig Dispense Refill Last Dose  . ADVAIR DISKUS 250-50 MCG/DOSE AEPB Inhale 1 puff into the lungs 2 (two) times daily.    07/09/2014 at Unknown time  . apixaban (ELIQUIS) 5 MG TABS tablet Take 5 mg by mouth 2 (two) times daily.   07/09/2014 at Unknown time  . atorvastatin (LIPITOR) 20 MG tablet Take 20 mg by mouth daily at 6 PM.    07/09/2014 at Unknown time  .  azithromycin (ZITHROMAX) 250 MG tablet Take 250 mg by mouth daily.    07/09/2014 at Unknown time  . metoprolol succinate (TOPROL-XL) 50 MG 24 hr tablet Take 100 mg by mouth daily.    07/09/2014 at 0800  . PROAIR HFA 108 (90 BASE) MCG/ACT inhaler Inhale 1 puff into the lungs every 4 (four) hours as needed for wheezing or shortness of breath.    07/09/2014 at Unknown time  . SPIRIVA HANDIHALER 18 MCG inhalation capsule Place 18 mcg into inhaler and inhale daily.    07/09/2014 at Unknown time  . spironolactone (ALDACTONE) 25 MG tablet Take 25 mg by mouth daily.    07/09/2014 at Unknown time    Family History: Only child , no children, father died suddenly at home at age 63 , ? MI No family history of dissection or aortic any serum   Review of Systems:    Cardiac Review of Systems: Y or N Chest Pain [ n ] Resting SOB [  y ]Exertional SOB [ y ] Orthopnea Blue.Reese ] Pedal Edema Blue.Reese ] Palpitations Blue.Reese ]Syncope [ n ] Presyncope [ y ] General Review of Systems: [Y] = yes [ ] =no Constitional: recent weight change Vixen.Miu ]; anorexia [ ] ; fatigue Blue.Reese ]; nausea [ ] ; night sweats [ ] ; fever [n ]; or chills [ n ]  Dental: poor dentition[ y ]; Last Dentist visit: Has upper plates and lower implants  Eye : blurred vision [ n ]; diplopia [ ] ; vision changes [ ] ; Amaurosis fugax[ ] ; Resp: cough Blue.Reese ]; wheezing[ y ]; hemoptysis[ n ]; shortness of breath[ y ]; paroxysmal nocturnal dyspnea[ ] ; dyspnea on exertion[y ]; or orthopnea[y ];  GI: gallstones[ ] , vomiting[ ] ; dysphagia[ ] ; melena[ ] ; hematochezia [ ] ; heartburn[ ] ; Hx of Colonoscopy[y ]; GU: kidney stones [ ] ; hematuria[ n ]; dysuria [ ] ; nocturia[ ] ; history of obstruction [ ] ; urinary frequency [ n ]  Skin: rash,  swelling[ ] ;, hair loss[ ] ; peripheral edema[ ] ; or itching[ ] ; Musculosketetal: myalgias[ ] ; joint swelling[ y ]; joint erythema[ y ]; joint pain[ ] ; back pain[ ] ; Heme/Lymph: bruising[ ] ; bleeding[ ] ; anemia[ ] ;  Neuro: TIA[ ] ; headaches[ ] ; stroke[ ] ; vertigo[ ] ; seizures[ n ]; paresthesias[ ] ; difficulty walking[ n ]; Psych:depression[ ] ; anxiety[ ] ; Endocrine: diabetes[n ]; thyroid dysfunction[ n ]; Immunizations: Flu [n ]; Pneumococcal[ n]; Other:  Physical Exam: BP 106/57 mmHg  Pulse 61  Resp 16  Ht 6\' 2"  (1.88 m)  Wt 153 lb (69.4 kg)  BMI 19.64 kg/m2  SpO2 98%   General appearance: alert, cooperative, appears older than stated age and no distress Head: Normocephalic, without obvious abnormality, atraumatic Neck: no adenopathy, no carotid bruit, no JVD, supple, symmetrical, trachea midline and thyroid not enlarged, symmetric, no tenderness/mass/nodules Lymph nodes: Cervical, supraclavicular, and axillary nodes normal. Resp: clear to auscultation bilaterally Back: symmetric, no curvature. ROM normal. No CVA tenderness. Cardio: now regular Rhythm, ii/vi m of AI GI: soft, non-tender; bowel sounds normal; no masses, no organomegaly Extremities:  No pedal edema extremities normal, atraumatic, no cyanosis or edema and Homans sign is negative, no sign of DVT Neurologic: Grossly normal No carotid bruits Palpable dp and pt pulses, bilateral bilateral knee incisions from knee replacement  Negative thumb and wrist sign Palate is normal  Diagnostic Studies & Laboratory data:   Recent Radiology Findings:  Dg Eye Foreign Body  01/06/2015   CLINICAL DATA:  Metal working/exposure; clearance prior to MRI  EXAM: ORBITS FOR FOREIGN BODY - 2 VIEW  COMPARISON:  None.  FINDINGS: There is no evidence of metallic foreign body within the orbits. Metallic fixation devices and screw noted in the  mandible. No significant bone abnormality identified.  IMPRESSION: No evidence of metallic foreign body within the orbits. Postsurgical changes mandible.   Electronically Signed   By: Marcello Moores  Register   On: 01/06/2015 09:33   Mr Angiogram Chest W Wo Contrast  01/07/2015   CLINICAL DATA:  74 year old male with history of ischemic cardiomyopathy. Dilated aortic root. Follow-up examination.  EXAM: MRA CHEST WITH OR WITHOUT CONTRAST  TECHNIQUE: Angiographic images of the chest were obtained using MRA technique without and with intravenous contrast.  CONTRAST:  65mL MULTIHANCE GADOBENATE DIMEGLUMINE 529 MG/ML IV SOLN  COMPARISON:  CTA of the thorax 07/10/2014.  FINDINGS: Again noted is aneurysmal dilatation of the aortic root. The aortic root measures approximately 5.6 cm in diameter at the level of the sinuses of Valsalva. Sino-tubular junction is effaced. Above this, the ascending  aorta tapers to a normal caliber of 3.4 cm in diameter. Mid thoracic aortic arch measures up to 2.5 cm in diameter. Isthmus of the aorta measures up to 3.7 cm in diameter. Descending thoracic aorta measures up to 2.7 cm in diameter. Normal three-vessel arch anatomy noted.  IMPRESSION: 1. Aneurysmal dilatation of the aortic root redemonstrated, measuring approximately 5.6 cm at the level of the sinuses of Valsalva. Given the sinotubular junction effacement, clinical correlation for evidence of Marfan syndrome is recommended.   Electronically Signed   By: Vinnie Langton M.D.   On: 01/07/2015 11:15   Mr Card Morphology Wo/w Cm  01/08/2015   CLINICAL DATA:  Cardiomyopathy, dilated aortic root.  EXAM: CARDIAC MRI  TECHNIQUE: The patient was scanned on a 1.5 Tesla GE magnet. A dedicated cardiac coil was used. Functional imaging was done using Fiesta sequences. 2,3, and 4 chamber views were done to assess for RWMA's. Modified Simpson's rule using a short axis stack was used to calculate an ejection fraction on a dedicated work Brewing technologist. The patient received 30 cc of Multihance. After 10 minutes inversion recovery sequences were used to assess for infiltration and scar tissue.  FINDINGS: MR angiography of chest reported separately.  No gross abnormalities on limited images of the lung fields. The aortic root was dilated to about 5.6 cm. This is reported separately.  Mildly dilated left ventricle with normal wall thickness. Mildly decreased LV systolic function, EF 37% with diffuse hypokinesis. The right ventricle was mildly dilated with normal systolic function. Mildly dilated left and right atria. There was mild tricuspid regurgitation. There was no significant mitral regurgitation noted. The aortic valve was trileaflet with probably moderate aortic insufficiency and no stenosis. The aortic insufficiency was likely due to dilation of the aortic valve annulus.  On delayed enhancement imaging, there was no definite late gadolinium enhancement (LGE).  MEASUREMENTS: MEASUREMENTS LVEDV 244 mL  LV SV 125 mL  LV EF 51%  IMPRESSION: 1. Dilated aortic root to 5.6 cm, described in detail separately on MRA report.  2. Trileaflet aortic valve with probably moderate aortic insufficiency due to dilation of the aortic valve annulus.  3. Mildly dilated left ventricle with mild diffuse hypokinesis, EF 51%.  4.  Mildly dilated right ventricle with normal systolic function.  5. No definite myocardial LGE, so no definitive evidence for prior MI, infiltrative disease, or myocarditis.  [name]   Electronically Signed   By: [name] M.D.   On: 01/08/2015 07:32       Nm Pet Image Initial (pi) Skull Base To Thigh  07/24/2014   CLINICAL DATA:  Initial treatment strategy for right upper lobe pulmonary nodule.  EXAM: NUCLEAR MEDICINE PET SKULL BASE TO THIGH  TECHNIQUE: 10.2 mCi F-18 FDG was injected intravenously. Full-ring PET imaging was performed from the skull base to thigh after the radiotracer. CT data was obtained and used for  attenuation correction and anatomic localization.  FASTING BLOOD GLUCOSE:  Value: 109 mg/dl  COMPARISON:  CTA of the chest of 07/10/2014.  FINDINGS: NECK  No areas of abnormal hypermetabolism.  CHEST  No hypermetabolism within the right upper lobe. The nodule detailed on the prior exam likely represented branching vessels. Example image 32 of series 6 today. There is equivocal soft tissue fullness in this area on image 72 of series 501 of the prior CT. No thoracic nodal hypermetabolism.  ABDOMEN/PELVIS  No areas of abnormal hypermetabolism.  SKELETON  Hypermetabolism involving a posterior right-sided cervical  facet. Likely degenerative.  CT IMAGES PERFORMED FOR ATTENUATION CORRECTION  No cervical adenopathy.  Chest, abdomen, and pelvic findings deferred to recent diagnostic CT. No acute superimposed process. Cholecystectomy. Old granulomatous disease in the spleen. Prostatomegaly. Degenerative partial fusion of the bilateral sacroiliac joints.  IMPRESSION: No right upper lobe hypermetabolism to correspond to the right upper lobe Nodule detailed on the prior exam. This is favored to represent a branching vessel, when correlated with today's CT. As there is equivocal soft tissue fullness in this area on thin slice imaging of the prior diagnostic CTA, followup with chest CT at 6 months should be considered.   Electronically Signed   By: Abigail Miyamoto M.D.   On: 07/24/2014 15:57   Dg Chest Portable 1 View  07/10/2014   CLINICAL DATA:  Shortness of breath and chest discomfort.  EXAM: PORTABLE CHEST - 1 VIEW  COMPARISON:  Remote frontal and lateral views 10/23/2007  FINDINGS: The heart is enlarged. Mild prominence of central pulmonary vasculature without pulmonary edema. Lungs remain hyperinflated. No consolidation, pleural effusion, or pneumothorax. Minimal atelectasis or scarring at the left lung base.  IMPRESSION: 1. Mild cardiomegaly. 2. Hyperinflation with left basilar atelectasis.   Electronically Signed   By:  Jeb Levering M.D.   On: 07/10/2014 02:25   Ct Angio Abd/pel W/ And/or W/o  07/10/2014   CLINICAL DATA:  74 year old male with increasing shortness of breath for the past 2 weeks, cough, chest tightness and shortness of breath, which worsens when lying flat. History of atrial fibrillation. History of thoracic aortic aneurysm.  EXAM: CT ANGIOGRAPHY CHEST, ABDOMEN AND PELVIS  TECHNIQUE: Multidetector CT imaging through the chest, abdomen and pelvis was performed using the standard protocol during bolus administration of intravenous contrast. Multiplanar reconstructed images and MIPs were obtained and reviewed to evaluate the vascular anatomy.  CONTRAST:  193mL OMNIPAQUE IOHEXOL 350 MG/ML SOLN  COMPARISON:  None.  FINDINGS: CTA CHEST FINDINGS  Mediastinum/Lymph Nodes: Aneurysmal dilatation of the aortic root which measures 3.2 cm in diameter at the annulus, but 5.7 cm in diameter at the level of the sinuses of Valsalva. The sino-tubular junction is effaced. The thoracic aortic arch is normal in caliber at 2.8 cm. The isthmus of the thoracic aorta is mildly ectatic measuring 4 cm in diameter. The descending thoracic aorta is normal in caliber at 3 cm in diameter. No evidence of thoracic aortic dissection. Heart size is mildly enlarged. There is no significant pericardial fluid, thickening or pericardial calcification. There is atherosclerosis of the thoracic aorta, the great vessels of the mediastinum and the coronary arteries, including calcified atherosclerotic plaque in the left main and left anterior descending coronary arteries. Multiple borderline enlarged and mildly enlarged mediastinal and right hilar lymph nodes, largest of which is in the low right paratracheal nodal station measuring 14 mm in short axis. Multiple densely calcified right hilar and mediastinal lymph nodes, presumably from old granulomatous disease. Esophagus is normal in appearance. Multiple borderline enlarged and mildly enlarged left  axillary lymph nodes measuring up to 10 mm in short axis.  Lungs/Pleura: Large calcified granuloma in the posterior aspect of the right lower lobe with adjacent scarring. 12 x 7 mm right upper lobe pulmonary nodule (image 29 of series 506). No acute consolidative airspace disease. No pleural effusions. Diffuse subpleural bullae are noted in the apices of the lungs bilaterally. Small left-sided Bochdalek's hernia.  Musculoskeletal/Soft Tissues: There are no aggressive appearing lytic or blastic lesions noted in the visualized portions of the skeleton.  Review of the MIP images confirms the above findings.  CTA ABDOMEN AND PELVIS FINDINGS  Hepatobiliary: Status post cholecystectomy. 8 mm low attenuation lesion in segment 6 of the liver (image 180 of series 501), too small to characterize, but statistically likely a tiny cyst. No other suspicious appearing hepatic lesions. No intra or extrahepatic biliary ductal dilatation.  Pancreas: Unremarkable.  Spleen: Multiple calcifications in the spleen, and irregular splenic contour, which could relate to remote trauma or prior splenic infarcts.  Adrenals/Urinary Tract: Bilateral kidneys and bilateral adrenal glands are normal in appearance. No hydroureteronephrosis. Urinary bladder is normal in appearance.  Stomach/Bowel: Who normal appearance of the stomach. No pathologic dilatation of small bowel or colon. Normal appendix.  Vascular/Lymphatic: Atherosclerosis throughout the abdominal and pelvic vasculature. Aneurysmal dilatation of the common iliac arteries bilaterally which measure up to 1.6 cm in diameter bilaterally. No evidence of dissection. No lymphadenopathy noted in the abdomen or pelvis.  Reproductive: Prostate gland is enlarged and heterogeneous in appearance measuring 6.3 x 4.1 cm. Seminal vesicles are unremarkable in appearance.  Other: No significant volume of ascites.  No pneumoperitoneum.  Musculoskeletal: There are no aggressive appearing lytic or blastic  lesions noted in the visualized portions of the skeleton.  Review of the MIP images confirms the above findings.  IMPRESSION: 1. Aneurysmal dilatation of the aortic root, which measures up to 5.7 cm in diameter at the level of the sinuses of Valsalva. There is effacement of the sino-tubular junction. This is commonly seen in the setting of Marfan syndrome. No evidence of aortic dissection at this time. There is also some ectasia at of the isthmus of the thoracic aorta which measures up to 4 cm in diameter. 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 2. 12 x 7 mm right upper lobe pulmonary nodule (image 29 of series 506). Followup evaluation with PET-CT is recommended in the near future to evaluate for potential malignancy. 3. Left main and left anterior descending coronary artery disease. Assessment for potential risk factor modification, dietary therapy or pharmacologic therapy may be warranted, if clinically indicated. 4. Mild cardiomegaly. 23. Sequela of old granulomatous disease, as above. Multiple borderline enlarged and mildly enlarged mediastinal and right hilar lymph nodes that are noncalcified are also noted, as well as borderline enlarged and minimally enlarged left axillary lymph nodes. These are nonspecific, but clinical correlation to exclude the possibility of underlying lymphoproliferative disorder may be appropriate. 6. Additional incidental findings, as above.   Electronically Signed   By: Vinnie Langton M.D.   On: 07/10/2014 21:47   Ct Angio Chest Pe W/cm &/or Wo Cm  07/10/2014 CLINICAL DATA: 74 year old male with increasing shortness of breath for the past 2 weeks, cough, chest tightness and shortness of breath, which worsens when lying flat. History of atrial fibrillation. History of thoracic aortic aneurysm. EXAM: CT ANGIOGRAPHY CHEST, ABDOMEN AND PELVIS TECHNIQUE: Multidetector CT imaging through the  chest, abdomen and pelvis was performed using the standard protocol during bolus administration of intravenous contrast. Multiplanar reconstructed images and MIPs were obtained and reviewed to evaluate the vascular anatomy. CONTRAST: 159mL OMNIPAQUE IOHEXOL 350 MG/ML SOLN COMPARISON: None. FINDINGS: CTA CHEST FINDINGS Mediastinum/Lymph Nodes: Aneurysmal dilatation of the aortic root which measures 3.2 cm in diameter at the annulus, but 5.7 cm in diameter at the level of the sinuses of Valsalva. The sino-tubular junction is effaced. The thoracic aortic arch is normal in caliber at 2.8 cm.  The isthmus of the thoracic aorta is mildly ectatic measuring 4 cm in diameter. The descending thoracic aorta is normal in caliber at 3 cm in diameter. No evidence of thoracic aortic dissection. Heart size is mildly enlarged. There is no significant pericardial fluid, thickening or pericardial calcification. There is atherosclerosis of the thoracic aorta, the great vessels of the mediastinum and the coronary arteries, including calcified atherosclerotic plaque in the left main and left anterior descending coronary arteries. Multiple borderline enlarged and mildly enlarged mediastinal and right hilar lymph nodes, largest of which is in the low right paratracheal nodal station measuring 14 mm in short axis. Multiple densely calcified right hilar and mediastinal lymph nodes, presumably from old granulomatous disease. Esophagus is normal in appearance. Multiple borderline enlarged and mildly enlarged left axillary lymph nodes measuring up to 10 mm in short axis. Lungs/Pleura: Large calcified granuloma in the posterior aspect of the right lower lobe with adjacent scarring. 12 x 7 mm right upper lobe pulmonary nodule (image 29 of series 506). No acute consolidative airspace disease. No pleural effusions. Diffuse subpleural bullae are noted in the apices of the lungs bilaterally. Small left-sided Bochdalek's hernia.  Musculoskeletal/Soft Tissues: There are no aggressive appearing lytic or blastic lesions noted in the visualized portions of the skeleton. Review of the MIP images confirms the above findings. CTA ABDOMEN AND PELVIS FINDINGS Hepatobiliary: Status post cholecystectomy. 8 mm low attenuation lesion in segment 6 of the liver (image 180 of series 501), too small to characterize, but statistically likely a tiny cyst. No other suspicious appearing hepatic lesions. No intra or extrahepatic biliary ductal dilatation. Pancreas: Unremarkable. Spleen: Multiple calcifications in the spleen, and irregular splenic contour, which could relate to remote trauma or prior splenic infarcts. Adrenals/Urinary Tract: Bilateral kidneys and bilateral adrenal glands are normal in appearance. No hydroureteronephrosis. Urinary bladder is normal in appearance. Stomach/Bowel: Who normal appearance of the stomach. No pathologic dilatation of small bowel or colon. Normal appendix. Vascular/Lymphatic: Atherosclerosis throughout the abdominal and pelvic vasculature. Aneurysmal dilatation of the common iliac arteries bilaterally which measure up to 1.6 cm in diameter bilaterally. No evidence of dissection. No lymphadenopathy noted in the abdomen or pelvis. Reproductive: Prostate gland is enlarged and heterogeneous in appearance measuring 6.3 x 4.1 cm. Seminal vesicles are unremarkable in appearance. Other: No significant volume of ascites. No pneumoperitoneum. Musculoskeletal: There are no aggressive appearing lytic or blastic lesions noted in the visualized portions of the skeleton. Review of the MIP images confirms the above findings. IMPRESSION: 1. Aneurysmal dilatation of the aortic root, which measures up to 5.7 cm in diameter at the level of the sinuses of Valsalva. There is effacement of the sino-tubular junction. This is commonly seen in the setting of Marfan syndrome. No evidence of aortic dissection at this time. There is also  some ectasia at of the isthmus of the thoracic aorta which measures up to 4 cm in diameter. 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 2. 12 x 7 mm right upper lobe pulmonary nodule (image 29 of series 506). Followup evaluation with PET-CT is recommended in the near future to evaluate for potential malignancy. 3. Left main and left anterior descending coronary artery disease. Assessment for potential risk factor modification, dietary therapy or pharmacologic therapy may be warranted, if clinically indicated. 4. Mild cardiomegaly. 86. Sequela of old granulomatous disease, as above. Multiple borderline enlarged and mildly enlarged mediastinal and right hilar lymph nodes that  are noncalcified are also noted, as well as borderline enlarged and minimally enlarged left axillary lymph nodes. These are nonspecific, but clinical correlation to exclude the possibility of underlying lymphoproliferative disorder may be appropriate. 6. Additional incidental findings, as above. Electronically Signed By: Vinnie Langton M.D. On: 07/10/2014 21:47   Dg Chest Portable 1 View  07/10/2014 CLINICAL DATA: Shortness of breath and chest discomfort. EXAM: PORTABLE CHEST - 1 VIEW COMPARISON: Remote frontal and lateral views 10/23/2007 FINDINGS: The heart is enlarged. Mild prominence of central pulmonary vasculature without pulmonary edema. Lungs remain hyperinflated. No consolidation, pleural effusion, or pneumothorax. Minimal atelectasis or scarring at the left lung base. IMPRESSION: 1. Mild cardiomegaly. 2. Hyperinflation with left basilar atelectasis. Electronically Signed By: Jeb Levering M.D. On: 07/10/2014 02:25    I have independently reviewed the above radiologic studies.  Cardiac Cath:FINDINGS: LV showed LV was moderately enlarged. There was global hypokinesia. EF of approximately 20% to 25%. Left main was  very short, which was patent. LAD was patent. Diagonal 1 and 2 were patent. Left circumflex was patent. OM-1 was small, which was patent. OM-2 was moderate sized, which was patent. RCA was patent. PDA and PLV branches were patent.  The patient tolerated the procedure well. There were no complications. The patient was transferred to recovery room in stable condition  PLAN: To maximize beta-blockers and ACE inhibitors. We will start Eliquis tomorrow and possibly discharge tomorrow. The patient will be scheduled for PET scan as an outpatient and open heart surgery for aortic root replacement, possibly aortic valve as an outpatient.   Allegra Lai. Terrence Dupont, M.D.  I have independently reviewed the above cath films and reviewed the findings with the patient .   Recent Lab Findings:  Recent Labs    Lab Results  Component Value Date   WBC 4.8 07/15/2014   HGB 14.4 07/15/2014   HCT 43.4 07/15/2014   PLT 168 07/15/2014   GLUCOSE 113* 07/13/2014   ALT 149* 07/10/2014   AST 79* 07/10/2014   NA 138 07/13/2014   K 4.0 07/13/2014   CL 106 07/13/2014   CREATININE 0.88 07/13/2014   BUN 15 07/13/2014   CO2 24 07/13/2014   INR 1.47 07/14/2014     RecentCardiac CATH: 06/2014  FINDINGS: LV showed LV was moderately enlarged. There was global hypokinesia. EF of approximately 20% to 25%. Left main was very short, which was patent. LAD was patent. Diagonal 1 and 2 were patent. Left circumflex was patent. OM-1 was small, which was patent. OM-2 was moderate sized, which was patent. RCA was patent. PDA and PLV branches were patent.  The Swan findings were RA pressure was 3 mmHg, RV was 26/1, PA was 24/7, wedge pressure was 16. Cardiac output by Fick was 4.10 and by thermodilution it was 4.65.  REPEAT ECHO: 07/2014: ------------------------------------------------------------------- LV EF: 15% -   20%  ------------------------------------------------------------------- Indications:   CHF - 428.0.  ------------------------------------------------------------------- History:  PMH:  Congestive heart failure.  ------------------------------------------------------------------- Study Conclusions  - Left ventricle: The cavity size was moderately dilated. The estimated ejection fraction was in the range of 15% to 20%. Wall motion was normal; there were no regional wall motion abnormalities. - Aortic valve: There was mild regurgitation. - Left atrium: The atrium was mildly dilated. - Atrial septum: No defect or patent foramen ovale was identified. - Pulmonary arteries: Systolic pressure was mildly increased.  Echocardiography. M-mode, complete 2D, spectral Doppler, and color Doppler. Birthdate: Patient birthdate: 10-19-1940. Age: Patient is 74 yr old. Sex: Gender: male.  BMI: 22.9 kg/m^2. Blood pressure:   114/72 Patient status: Outpatient. Study date: Study date: 08/07/2014. Study time: 10:02 AM. Location: Echo laboratory.  -------------------------------------------------------------------  ------------------------------------------------------------------- Left ventricle: The cavity size was moderately dilated. The estimated ejection fraction was in the range of 15% to 20%. Wall motion was normal; there were no regional wall motion abnormalities.  ------------------------------------------------------------------- Aortic valve:  Structurally normal valve.  Cusp separation was normal. Doppler: Transvalvular velocity was within the normal range. There was no stenosis. There was mild regurgitation.  ------------------------------------------------------------------- Aorta: The aorta was moderately dilated. There was moderate dilation of the sinus(es) of Valsalva. Aortic root: The aortic root was moderately dilated. Ascending aorta: The  ascending aorta was moderately dilated.  ------------------------------------------------------------------- Mitral valve:  Structurally normal valve.  Leaflet separation was normal. Doppler: Transvalvular velocity was within the normal range. There was no evidence for stenosis. There was trivial regurgitation.  ------------------------------------------------------------------- Left atrium: The atrium was mildly dilated.  ------------------------------------------------------------------- Atrial septum: No defect or patent foramen ovale was identified.  ------------------------------------------------------------------- Right ventricle: The cavity size was normal. Wall thickness was normal. Systolic function was normal.  ------------------------------------------------------------------- Pulmonic valve:  Doppler: There was no significant regurgitation.  ------------------------------------------------------------------- Tricuspid valve:  Structurally normal valve.  Leaflet separation was normal. Doppler: Transvalvular velocity was within the normal range. There was mild regurgitation.  ------------------------------------------------------------------- Pulmonary artery:  Systolic pressure was mildly increased.  ------------------------------------------------------------------- Right atrium: The atrium was normal in size.  ------------------------------------------------------------------- Pericardium: There was no pericardial effusion.  ------------------------------------------------------------------- Post procedure conclusions Ascending Aorta:  - The aorta was moderately dilated.  ------------------------------------------------------------------- Measurements  Left ventricle              Value    Reference LV ID, ED, PLAX chordal     (H)   70.7 mm   43 - 52 LV ID, ES, PLAX chordal     (H)   67.5 mm   23 -  38 LV fx shortening, PLAX chordal  (L)   5   %   >=29 LV PW thickness, ED           8.12 mm   --------- IVS/LV PW ratio, ED           1.16     <=1.3 LV e&', lateral              8.49 cm/s  --------- LV E/e&', lateral             6.53     --------- LV e&', medial              6.09 cm/s  --------- LV E/e&', medial             9.1     --------- LV e&', average              7.29 cm/s  --------- LV E/e&', average             7.6     ---------  Ventricular septum            Value    Reference IVS thickness, ED            9.38 mm   ---------  Aortic valve               Value    Reference Aortic regurg pressure half-time     710  ms   ---------  Aorta  Value    Reference Aortic root ID, ED            49  mm   ---------  Left atrium               Value    Reference LA ID, A-P, ES              44  mm   --------- LA ID/bsa, A-P              2.14 cm/m^2 <=2.2 LA volume, S               93.3 ml   --------- LA volume/bsa, S             45.5 ml/m^2 --------- LA volume, ES, 1-p A4C          74.6 ml   --------- LA volume/bsa, ES, 1-p A4C        36.4 ml/m^2 --------- LA volume, ES, 1-p A2C          107  ml   --------- LA volume/bsa, ES, 1-p A2C        52.2 ml/m^2 ---------  Mitral valve               Value    Reference Mitral E-wave peak velocity       55.4 cm/s  --------- Mitral A-wave peak velocity       39.8 cm/s  --------- Mitral deceleration time     (H)   246  ms   150 - 230 Mitral E/A ratio, peak          1.4     ---------  Pulmonary arteries             Value    Reference PA pressure, S, DP            20  mm Hg <=30  Tricuspid valve             Value    Reference Tricuspid regurg peak velocity      209  cm/s  --------- Tricuspid peak RV-RA gradient      17  mm Hg ---------  Systemic veins              Value    Reference Estimated CVP              3   mm Hg ---------  Right ventricle             Value    Reference RV pressure, S, DP            20  mm Hg <=30 RV s&', lateral, S            6.42 cm/s  ---------  Legend: (L) and (H) mark values outside specified reference range.  ------------------------------------------------------------------- Prepared and Electronically Authenticated by  Charolette Forward, MD 2016-04-08T13:05:59  ------------------------------------------------------------------- PFT's 07/12/2014 21.5 57% DLCO 25.45 67% I does get Response to bronchodilator Airtrapping Mild Diffusion Defect  Aortic Size Index=     5.7    /Body surface area is 1.90 meters squared. = 2.78  < 2.75 cm/m2      4% risk per year 2.75 to 4.25          8% risk per year > 4.25 cm/m2    20% risk per year  Wt Readings from Last 3 Encounters:  01/14/15 153 lb (69.4 kg)  12/17/14 154 lb (69.854 kg)  12/17/14 154 lb  6.4 oz (70.035 kg)    Assessment / Plan:  1/ Aneurysmal dilatation of the aortic root, which measures up to 5.4- 5.7 cm in diameter at the level of the sinuses of Valsalva. No evidence of aortic dissection at this time. 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 Now in sinus at  controlled rate The most recent cardiac MRI indicates the patient's ejection fraction has significantly improved with aggressive treatment to the 50% ejection fraction.  5/Left main (short)and left anterior descending coronary artery calcification on CT but without significant CAD at cath  6/ AFib-  on Elliquis  7/ COPD- Moderately severe Obstructive Airways Disease, Response to bronchodilator, Airtrapping, Mild Diffusion Defect  Recommend:   With the patient's significant improvement in his overall functional status and LV function I believe we have now reached a point in his care that has been optimized and I have recommended to him that we proceed with aortic root and aortic valve replacement because of the risk of aortic dissection and continue worsening of aortic insufficiency. Risks and options were discussed with the patient in detail. He is agreeable with proceeding with a tissue valve. He would like to wait until later in October or November to proceed with surgery. He will call the office and let us know when he is ready to proceed.         Grace Isaac MD      [address] [place] [address] Office [phone]   Beeper [phone]  01/14/2015 3:13 PM

## 2015-01-28 ENCOUNTER — Ambulatory Visit (HOSPITAL_COMMUNITY)
Admission: RE | Admit: 2015-01-28 | Discharge: 2015-01-28 | Disposition: A | Discharge status: Home / Self Care | Payer: Medicare Other | Source: Ambulatory Visit | Attending: Cardiology | Admitting: Cardiology

## 2015-01-28 VITALS — BP 128/62 | HR 89 | Wt 152.8 lb

## 2015-01-28 DIAGNOSIS — Z87891 Personal history of nicotine dependence: Secondary | ICD-10-CM | POA: Insufficient documentation

## 2015-01-28 DIAGNOSIS — J449 Chronic obstructive pulmonary disease, unspecified: Secondary | ICD-10-CM | POA: Insufficient documentation

## 2015-01-28 DIAGNOSIS — I351 Nonrheumatic aortic (valve) insufficiency: Secondary | ICD-10-CM | POA: Insufficient documentation

## 2015-01-28 DIAGNOSIS — Z8249 Family history of ischemic heart disease and other diseases of the circulatory system: Secondary | ICD-10-CM | POA: Insufficient documentation

## 2015-01-28 DIAGNOSIS — I428 Other cardiomyopathies: Secondary | ICD-10-CM | POA: Insufficient documentation

## 2015-01-28 DIAGNOSIS — Q2543 Congenital aneurysm of aorta: Secondary | ICD-10-CM

## 2015-01-28 DIAGNOSIS — I5022 Chronic systolic (congestive) heart failure: Secondary | ICD-10-CM

## 2015-01-28 DIAGNOSIS — E785 Hyperlipidemia, unspecified: Secondary | ICD-10-CM | POA: Insufficient documentation

## 2015-01-28 DIAGNOSIS — Z7902 Long term (current) use of antithrombotics/antiplatelets: Secondary | ICD-10-CM | POA: Diagnosis not present

## 2015-01-28 DIAGNOSIS — I7121 Aneurysm of the ascending aorta, without rupture: Secondary | ICD-10-CM

## 2015-01-28 DIAGNOSIS — Z79899 Other long term (current) drug therapy: Secondary | ICD-10-CM | POA: Diagnosis not present

## 2015-01-28 DIAGNOSIS — I10 Essential (primary) hypertension: Secondary | ICD-10-CM | POA: Insufficient documentation

## 2015-01-28 DIAGNOSIS — I719 Aortic aneurysm of unspecified site, without rupture: Secondary | ICD-10-CM

## 2015-01-28 DIAGNOSIS — I480 Paroxysmal atrial fibrillation: Secondary | ICD-10-CM | POA: Diagnosis not present

## 2015-01-28 LAB — CBC
HCT: 33.3 % — ABNORMAL LOW (ref 39.0–52.0)
Hemoglobin: 11.3 g/dL — ABNORMAL LOW (ref 13.0–17.0)
MCH: 30.7 pg (ref 26.0–34.0)
MCHC: 33.9 g/dL (ref 30.0–36.0)
MCV: 90.5 fL (ref 78.0–100.0)
PLATELETS: 195 10*3/uL (ref 150–400)
RBC: 3.68 MIL/uL — AB (ref 4.22–5.81)
RDW: 12.8 % (ref 11.5–15.5)
WBC: 3.4 10*3/uL — AB (ref 4.0–10.5)

## 2015-01-28 LAB — BASIC METABOLIC PANEL
Anion gap: 8 (ref 5–15)
BUN: 28 mg/dL — ABNORMAL HIGH (ref 6–20)
CALCIUM: 9.8 mg/dL (ref 8.9–10.3)
CO2: 25 mmol/L (ref 22–32)
CREATININE: 1.3 mg/dL — AB (ref 0.61–1.24)
Chloride: 101 mmol/L (ref 101–111)
GFR, EST NON AFRICAN AMERICAN: 53 mL/min — AB (ref >60)
Glucose, Bld: 107 mg/dL — ABNORMAL HIGH (ref 65–99)
Potassium: 4.7 mmol/L (ref 3.5–5.1)
SODIUM: 134 mmol/L — AB (ref 135–145)

## 2015-01-28 MED ORDER — CARVEDILOL 6.25 MG PO TABS: 6.2500 mg | ORAL_TABLET | Freq: Two times a day (BID) | ORAL | Status: AC

## 2015-01-28 MED ORDER — SPIRONOLACTONE 25 MG PO TABS: 12.5000 mg | ORAL_TABLET | Freq: Every day | ORAL | Status: DC

## 2015-01-28 MED ORDER — SPIRONOLACTONE 25 MG PO TABS: 25.0000 mg | ORAL_TABLET | Freq: Every day | ORAL | Status: DC

## 2015-01-28 NOTE — Patient Instructions (Signed)
Increase Carvedilol to 6.25 mg Twice daily   Your prescription for Spironolactone will state take 1 tab daily but ONLY TAKE 1/2 TAB DAILY  Labs today  We will contact you in 2 months to schedule your next appointment.

## 2015-01-28 NOTE — Progress Notes (Signed)
Patient ID: [name], male   DOB: 02-02-1941, 74 y.o.   MRN: [id number] Cardiologist: Dr. Aundra Dubin  74 yo with history of HTN and COPD was admitted in 3/16 with atrial fibrillation/RVR and dyspnea.  Echo showed low EF (15-20% range).  He had TEE-guided DCCV to NSR and cardiac catheterization.  Cath showed no obstructive coronary disease (nonischemic cardiomyopathy).  Echo showed dilated aortic root.  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  He has seen Dr Servando Snare for evaluation for aortic root replacement.   At initial appointment, patient was noted to be back in atrial fibrillation.  He was put on amiodarone and I took him for DCCV in 6/16.  DCCV was unsuccessful, but I left him on amiodarone for the time being.  He later converted back to NSR and remains in NSR today.  Cardiac MRI/MRA chest in 9/16 showed EF improved to 51% with moderate AI and 5.6 cm aortic root.   He is watching his sodium intake.  Weight is down 2 lbs.  He has occasional fatigue with heavy exertion but denies significant dyspnea.  He walks for exercise up to a mile.  No chest pain.  No falls/syncope.  No lightheadedness.  No orthopnea/PND.  No family history of dissection or aneurysms.  Aortic valve is trileaflet.  No melena or BRBPR on apixaban. He saw pulmonary for evaluation of abnormal PFTs and pre-op assessment.  Dr Lake Bells thought that surgery would be ok.    Labs (3/16): HCT 43.4, K 4, creatinine 0.88 Labs (08/2014): K 5.1 => 4.6, Creatinine 1.12 => 1.36, Hgb 15.0  Labs (7/16): K 4.6, creatinine 1.07, TSH normal, free T4/free T3 normal, LFTs normal, HCT 35.9 Labs (9/16): creatinine 1.21  ECG: NSR, RBBB, LAFB  PMH: 1. HTN 2. Hyperlipidemia 3. Osteoarthritis 4. Atrial fibrillation: Paroxysmal.  Atrial fibrillation first noted in 3/16.  TEE-guided DCCV in 3/16.  Noted to be back in atrial fibrillation 5/16 CHF appt. DCCV 6/16 was unsuccessful but  he was in NSR at 7/16 appt.  5. Right upper lobe lung nodule: PET negative.  6. COPD: PFTs (3/16) with FVC 75%, FEV1 57%, ratio 75%, TLC 101%, DLCO 67%.  Moderate to severe obstructive disease.  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  CTA chest (3/16) with 5.7 cm aortic root with effacement of STJ, no dissection. The aortic valve is trileaflet. MRA chest (9/16) with 5.6 cm aortic root.  8. Nonischemic cardiomyopathy: Echo (4/16) with EF 15-20%, moderate LV dilation, mild AI.  RHC/LHC (3/16) with mean RA 3, PA 24/7, mean PCWP 16, CI 4.1, EF 20-25%, no significant CAD.  Cardiac MRI (9/16) with EF 51%, diffuse mild HK, mildly dilated RV with normal systolic function, moderate AI with trileaflet aortic valve, no delayed enhancement.  9. 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  Moderate by TEE in 3/16, mild by TTE in 4/16.  Moderate by MRI 9/16.   SH: Separated from wife, lives in [place], has not smoked for many years but has been around second hand smoke a lot, no ETOH.  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   FH: Father with MI at 30, mother with "heart trouble."   ROS: All systems reviewed and negative except as per HPI.   Current Outpatient Prescriptions  Medication Sig Dispense Refill  . ADVAIR DISKUS 250-50 MCG/DOSE AEPB Inhale 1 puff into the  lungs 2 (two) times daily.     Marland Kitchen ALPRAZolam (XANAX) 0.25 MG tablet Take 0.25 mg by mouth at bedtime as needed for anxiety or sleep.     Marland Kitchen amiodarone (PACERONE) 200 MG tablet Take 1 tablet (200 mg total) by mouth daily. 30 tablet 3  . apixaban (ELIQUIS) 5 MG TABS tablet Take 5 mg by mouth 2 (two) times daily.    Marland Kitchen atorvastatin (LIPITOR) 20 MG tablet Take 20 mg by mouth daily at 6 PM.     . carvedilol (COREG) 6.25 MG tablet Take 1 tablet (6.25 mg total) by mouth 2 (two) times daily with a meal. 60 tablet 6  . lisinopril (PRINIVIL,ZESTRIL) 5 MG tablet TAKE 1 TABLET BY MOUTH  TWICE A DAY 60 tablet 3  . Multiple Vitamin (MULTIVITAMIN) tablet Take 1 tablet by mouth daily.    Marland Kitchen PROAIR HFA 108 (90 BASE) MCG/ACT inhaler Inhale 1 puff into the lungs every 4 (four) hours as needed for wheezing or shortness of breath.     . SPIRIVA HANDIHALER 18 MCG inhalation capsule Place 18 mcg into inhaler and inhale daily.     Marland Kitchen spironolactone (ALDACTONE) 25 MG tablet Take 0.5 tablets (12.5 mg total) by mouth daily. 30 tablet 3   No current facility-administered medications for this encounter.   BP 128/62 mmHg  Pulse 89  Wt 152 lb 12.8 oz (69.31 kg)  SpO2 98%  General: NAD Neck: No JVD, no thyromegaly or thyroid nodule.  Lungs: Mildly prolonged expiratory phase.  CV: Distant heart sounds.  Heart regular, bradycardic, S1/S2, no S3/S4, no murmur.  No peripheral edema.  No carotid bruit.  Normal pedal pulses.  Abdomen: Soft, nontender, no hepatosplenomegaly, no distention.  Skin: Intact without lesions or rashes.  Neurologic: Alert and oriented x 3.  Psych: Normal affect. Extremities: No clubbing or cyanosis.  HEENT: Normal.  MSK: Joints not hypermobile.   Assessment/Plan: 1. Chronic systolic CHF: Nonischemic cardiomyopathy.  4/16 echo with EF 15-20%.  Possible tachycardia-mediated cardiomyopathy, cardiac MRI in 9/16 while in NSR showed EF up to 51%.  Coronary angiography 3/16 without significant CAD. TEE in 3/16 and cardiac MRI in 9/16 showed moderate AI but doubt AI contributes significantly to cardiomyopathy. Euvolemic on exam. - He remains in NSR today.  - Continue spironolactone 12.5 mg daily and lisinopril 5 mg bid.   - Reasonable HR today, will try to increase Coreg back to 6.25 mg bid.  - EF out of range for ICD.  - BMET today.  2. Atrial fibrillation: Paroxysmal.  First identified in 3/16.  He is now on Eliquis and amiodarone 200 mg daily.  He has been back in NSR at last 3 appts.   - Continue amiodarone 200 mg daily.  LFTs/TSH to be rechecked at 2 month followup.  He  has had some light sensitivity and was again asked to see his eye doctor for an exam. - Continue Eliquis, CBC today.  3. Aortic root dilation: 5.7 cm aortic root with STJ effacement on CTA chest, 5.6 cm by MRA chest 9/16.  No dissection.  Cause uncertain. Aortic valve is trileaflet, BP is controlled.  He is relatively tall and thin, but not marfanoid, per se.  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  Aortic root size large enough to warrant prophylactic repair, given moderate AI will also need bioprosthetic aortic valve.  - With improvement in LV systolic function, he seems to be optimized for surgery.  He is going to call Dr Servando Snare to schedule.  4. 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  Moderate by 3/16 TEE, mild by 4/16 TTE, moderate by cardiac MRI in 9/16.   5. COPD: Moderately severe obstructive lung disease by PFTs.  Has not smoked for years.  Had second hand smoke exposure. Seen by pulmonary, thought to have stable lung disease that would not pose significantly increased surgical risk.   [name] 01/28/2015

## 2015-02-17 ENCOUNTER — Inpatient Hospital Stay: Admit: 2015-02-17 | Payer: Self-pay | Admitting: Cardiothoracic Surgery

## 2015-02-17 SURGERY — BENTALL PROCEDURE
Anesthesia: General | Site: Chest

## 2015-02-18 ENCOUNTER — Telehealth (HOSPITAL_COMMUNITY): Payer: Self-pay | Admitting: *Deleted

## 2015-02-18 DIAGNOSIS — H2513 Age-related nuclear cataract, bilateral: Secondary | ICD-10-CM | POA: Diagnosis not present

## 2015-02-18 DIAGNOSIS — H35373 Puckering of macula, bilateral: Secondary | ICD-10-CM | POA: Diagnosis not present

## 2015-02-18 NOTE — Telephone Encounter (Signed)
Pt called to let us know he had been to the eye doctor today and had an exam, he states he was told everything looked good and he will f/u next in 1 year

## 2015-02-19 DIAGNOSIS — Z23 Encounter for immunization: Secondary | ICD-10-CM | POA: Diagnosis not present

## 2015-02-25 ENCOUNTER — Other Ambulatory Visit: Payer: Self-pay | Admitting: *Deleted

## 2015-02-25 DIAGNOSIS — I351 Nonrheumatic aortic (valve) insufficiency: Secondary | ICD-10-CM

## 2015-03-15 ENCOUNTER — Encounter (HOSPITAL_COMMUNITY): Payer: Self-pay

## 2015-03-15 ENCOUNTER — Encounter (HOSPITAL_COMMUNITY)
Admission: RE | Admit: 2015-03-15 | Discharge: 2015-03-15 | Disposition: A | Discharge status: Home / Self Care | Payer: Medicare Other | Source: Ambulatory Visit | Attending: Cardiothoracic Surgery | Admitting: Cardiothoracic Surgery

## 2015-03-15 VITALS — BP 101/47 | HR 53 | Temp 97.3°F | Resp 20 | Ht 74.0 in | Wt 154.7 lb

## 2015-03-15 DIAGNOSIS — Z0183 Encounter for blood typing: Secondary | ICD-10-CM | POA: Insufficient documentation

## 2015-03-15 DIAGNOSIS — I420 Dilated cardiomyopathy: Secondary | ICD-10-CM | POA: Diagnosis not present

## 2015-03-15 DIAGNOSIS — I5042 Chronic combined systolic (congestive) and diastolic (congestive) heart failure: Secondary | ICD-10-CM | POA: Diagnosis not present

## 2015-03-15 DIAGNOSIS — I509 Heart failure, unspecified: Secondary | ICD-10-CM

## 2015-03-15 DIAGNOSIS — Z01818 Encounter for other preprocedural examination: Secondary | ICD-10-CM

## 2015-03-15 DIAGNOSIS — Z7902 Long term (current) use of antithrombotics/antiplatelets: Secondary | ICD-10-CM | POA: Insufficient documentation

## 2015-03-15 DIAGNOSIS — I351 Nonrheumatic aortic (valve) insufficiency: Secondary | ICD-10-CM

## 2015-03-15 DIAGNOSIS — J449 Chronic obstructive pulmonary disease, unspecified: Secondary | ICD-10-CM | POA: Insufficient documentation

## 2015-03-15 DIAGNOSIS — I452 Bifascicular block: Secondary | ICD-10-CM | POA: Insufficient documentation

## 2015-03-15 DIAGNOSIS — I110 Hypertensive heart disease with heart failure: Secondary | ICD-10-CM

## 2015-03-15 DIAGNOSIS — F419 Anxiety disorder, unspecified: Secondary | ICD-10-CM | POA: Insufficient documentation

## 2015-03-15 DIAGNOSIS — Z79899 Other long term (current) drug therapy: Secondary | ICD-10-CM | POA: Insufficient documentation

## 2015-03-15 DIAGNOSIS — I442 Atrioventricular block, complete: Secondary | ICD-10-CM | POA: Diagnosis not present

## 2015-03-15 DIAGNOSIS — J45909 Unspecified asthma, uncomplicated: Secondary | ICD-10-CM | POA: Insufficient documentation

## 2015-03-15 DIAGNOSIS — I4891 Unspecified atrial fibrillation: Secondary | ICD-10-CM

## 2015-03-15 DIAGNOSIS — Z01812 Encounter for preprocedural laboratory examination: Secondary | ICD-10-CM | POA: Insufficient documentation

## 2015-03-15 DIAGNOSIS — R001 Bradycardia, unspecified: Secondary | ICD-10-CM

## 2015-03-15 HISTORY — DX: Personal history of peptic ulcer disease: Z87.11

## 2015-03-15 HISTORY — DX: Anxiety disorder, unspecified: F41.9

## 2015-03-15 HISTORY — DX: Unspecified osteoarthritis, unspecified site: M19.90

## 2015-03-15 HISTORY — DX: Personal history of other diseases of the respiratory system: Z87.09

## 2015-03-15 HISTORY — DX: Unspecified cataract: H26.9

## 2015-03-15 HISTORY — DX: Personal history of other diseases of the digestive system: Z87.19

## 2015-03-15 LAB — URINALYSIS, ROUTINE W REFLEX MICROSCOPIC
Bilirubin Urine: NEGATIVE
Glucose, UA: NEGATIVE mg/dL
Hgb urine dipstick: NEGATIVE
Ketones, ur: NEGATIVE mg/dL
Leukocytes, UA: NEGATIVE
Nitrite: NEGATIVE
Protein, ur: NEGATIVE mg/dL
Specific Gravity, Urine: 1.014 (ref 1.005–1.030)
Urobilinogen, UA: 1 mg/dL (ref 0.0–1.0)
pH: 7 (ref 5.0–8.0)

## 2015-03-15 LAB — COMPREHENSIVE METABOLIC PANEL
ALT: 26 U/L (ref 17–63)
AST: 25 U/L (ref 15–41)
Albumin: 3.9 g/dL (ref 3.5–5.0)
Alkaline Phosphatase: 60 U/L (ref 38–126)
Anion gap: 6 (ref 5–15)
BUN: 27 mg/dL — ABNORMAL HIGH (ref 6–20)
CO2: 26 mmol/L (ref 22–32)
Calcium: 9.3 mg/dL (ref 8.9–10.3)
Chloride: 100 mmol/L — ABNORMAL LOW (ref 101–111)
Creatinine, Ser: 1.19 mg/dL (ref 0.61–1.24)
GFR calc Af Amer: >60 mL/min (ref >60)
GFR calc non Af Amer: 59 mL/min — ABNORMAL LOW (ref >60)
Glucose, Bld: 99 mg/dL (ref 65–99)
Potassium: 4.3 mmol/L (ref 3.5–5.1)
Sodium: 132 mmol/L — ABNORMAL LOW (ref 135–145)
Total Bilirubin: 0.6 mg/dL (ref 0.3–1.2)
Total Protein: 6.4 g/dL — ABNORMAL LOW (ref 6.5–8.1)

## 2015-03-15 LAB — CBC
HCT: 28.1 % — ABNORMAL LOW (ref 39.0–52.0)
Hemoglobin: 9.5 g/dL — ABNORMAL LOW (ref 13.0–17.0)
MCH: 30.6 pg (ref 26.0–34.0)
MCHC: 33.8 g/dL (ref 30.0–36.0)
MCV: 90.6 fL (ref 78.0–100.0)
Platelets: 208 10*3/uL (ref 150–400)
RBC: 3.1 MIL/uL — ABNORMAL LOW (ref 4.22–5.81)
RDW: 13.3 % (ref 11.5–15.5)
WBC: 4.1 10*3/uL (ref 4.0–10.5)

## 2015-03-15 LAB — SURGICAL PCR SCREEN
MRSA, PCR: NEGATIVE
Staphylococcus aureus: NEGATIVE

## 2015-03-15 LAB — PROTIME-INR
INR: 1.45 (ref 0.00–1.49)
Prothrombin Time: 17.7 seconds — ABNORMAL HIGH (ref 11.6–15.2)

## 2015-03-15 LAB — APTT: aPTT: 33 seconds (ref 24–37)

## 2015-03-15 NOTE — Progress Notes (Signed)
Levonne Spiller made aware of HgB, PT-INR, and of pending ABG.  Order placed to recollect ABG on day of surgery.

## 2015-03-15 NOTE — Progress Notes (Signed)
Patient reports stopping Eliquis on 03/11/2015.

## 2015-03-15 NOTE — Pre-Procedure Instructions (Signed)
    [name]  03/15/2015      [facility] [address] - [place], [place] - [address] [place] [address] Phone: [phone] Fax: [phone]    Your procedure is scheduled on Wednesday, November 16th, 2016.  Report to [facility] Admitting at 6:30 A.M.  Call this number if you have problems the morning of surgery:  [phone]   Remember:  Do not eat food or drink liquids after midnight.   Take these medicines the morning of surgery with A SIP OF WATER: Advair Diskus, Alprazolam (Xanax) if needed, Amiodarone (Pacerone), Carvedilol (Coreg), Proair HFA inhaler if needed (please bring with you), Spiriva handihaler.  Stop taking: Apixaban (Eliquis), Apirin, NSAIDS, Aleve, Naproxen, Ibuprofen, Advil, Motrin, BC's, Goody's, Fish oil, all herbal medications, and all vitamins.    Do not wear jewelry.  Do not wear lotions, powders, or colognes.  You may NOT wear deodorant.  Men may shave face and neck.  Do not bring valuables to the hospital.  [facility] is not responsible for any belongings or valuables.  Contacts, dentures or bridgework may not be worn into surgery.  Leave your suitcase in the car.  After surgery it may be brought to your room.  For patients admitted to the hospital, discharge time will be determined by your treatment team.  Patients discharged the day of surgery will not be allowed to drive home.   Special instructions:  See attached.   Please read over the following fact sheets that you were given. Pain Booklet, Coughing and Deep Breathing, Blood Transfusion Information, MRSA Information and Surgical Site Infection Prevention

## 2015-03-15 NOTE — Progress Notes (Signed)
PCP - Dr. Charolette Forward Cardiologist - Dr. Terrence Dupont  EKG- 03/15/15 CXR- 03/15/15  Echo- 06/2014 Cardiac Cath - 06/2014  Patient denies chest pain and shortness of breath at PAT appointment.

## 2015-03-16 ENCOUNTER — Other Ambulatory Visit: Payer: Self-pay | Admitting: *Deleted

## 2015-03-16 DIAGNOSIS — I351 Nonrheumatic aortic (valve) insufficiency: Secondary | ICD-10-CM

## 2015-03-16 LAB — HEMOGLOBIN A1C
Hgb A1c MFr Bld: 6.1 % — ABNORMAL HIGH (ref 4.8–5.6)
Mean Plasma Glucose: 128 mg/dL

## 2015-03-16 MED ORDER — PLASMA-LYTE 148 IV SOLN
INTRAVENOUS | Status: AC
  Administered 2015-03-17: 500 mL
  Filled 2015-03-16: qty 2.5

## 2015-03-16 MED ORDER — POTASSIUM CHLORIDE 2 MEQ/ML IV SOLN
80.0000 meq | INTRAVENOUS | Status: DC
  Filled 2015-03-16: qty 40

## 2015-03-16 MED ORDER — EPINEPHRINE HCL 1 MG/ML IJ SOLN
0.0000 ug/min | INTRAVENOUS | Status: DC
  Filled 2015-03-16: qty 4

## 2015-03-16 MED ORDER — VANCOMYCIN HCL 10 G IV SOLR
1250.0000 mg | INTRAVENOUS | Status: AC
  Administered 2015-03-17: 1250 mg via INTRAVENOUS
  Filled 2015-03-16: qty 1250

## 2015-03-16 MED ORDER — SODIUM CHLORIDE 0.9 % IV SOLN
INTRAVENOUS | Status: DC
  Filled 2015-03-16: qty 30

## 2015-03-16 MED ORDER — PHENYLEPHRINE HCL 10 MG/ML IJ SOLN
30.0000 ug/min | INTRAVENOUS | Status: DC
  Filled 2015-03-16: qty 2

## 2015-03-16 MED ORDER — DEXTROSE 5 % IV SOLN
1.5000 g | INTRAVENOUS | Status: AC
  Administered 2015-03-17: 1.5 g via INTRAVENOUS
  Filled 2015-03-16: qty 1.5

## 2015-03-16 MED ORDER — DEXTROSE 5 % IV SOLN
750.0000 mg | INTRAVENOUS | Status: DC
  Filled 2015-03-16: qty 750

## 2015-03-16 MED ORDER — DOPAMINE-DEXTROSE 3.2-5 MG/ML-% IV SOLN
0.0000 ug/kg/min | INTRAVENOUS | Status: AC
  Administered 2015-03-17: 2 ug/kg/min via INTRAVENOUS
  Filled 2015-03-16: qty 250

## 2015-03-16 MED ORDER — AMINOCAPROIC ACID 250 MG/ML IV SOLN
INTRAVENOUS | Status: AC
  Administered 2015-03-17: 69.8 mL/h via INTRAVENOUS
  Filled 2015-03-16: qty 40

## 2015-03-16 MED ORDER — NITROGLYCERIN IN D5W 200-5 MCG/ML-% IV SOLN
2.0000 ug/min | INTRAVENOUS | Status: DC
  Filled 2015-03-16: qty 250

## 2015-03-16 MED ORDER — MAGNESIUM SULFATE 50 % IJ SOLN
40.0000 meq | INTRAMUSCULAR | Status: DC
  Filled 2015-03-16: qty 10

## 2015-03-16 MED ORDER — DEXMEDETOMIDINE HCL IN NACL 400 MCG/100ML IV SOLN
0.1000 ug/kg/h | INTRAVENOUS | Status: AC
  Administered 2015-03-17: .2 ug/kg/h via INTRAVENOUS
  Filled 2015-03-16: qty 100

## 2015-03-16 MED ORDER — INSULIN REGULAR HUMAN 100 UNIT/ML IJ SOLN
INTRAMUSCULAR | Status: AC
  Administered 2015-03-17: 1 [IU]/h via INTRAVENOUS
  Filled 2015-03-16: qty 2.5

## 2015-03-16 NOTE — Anesthesia Preprocedure Evaluation (Addendum)
Anesthesia Evaluation  Patient identified by MRN, date of birth, ID band Patient awake    Reviewed: Allergy & Precautions, NPO status , Patient's Chart, lab work & pertinent test results, reviewed documented beta blocker date and time   Airway Mallampati: I  TM Distance: >3 FB Neck ROM: Full    Dental  (+) Edentulous Upper, Edentulous Lower   Pulmonary asthma , COPD,  COPD inhaler,    breath sounds clear to auscultation       Cardiovascular hypertension, Pt. on medications and Pt. on home beta blockers +CHF (Most recent EF 51% on cardiac MRI. Dilated aortic root. Moderate AI.)   Rhythm:Regular Rate:Normal     Neuro/Psych Anxiety negative neurological ROS     GI/Hepatic negative GI ROS, Neg liver ROS,   Endo/Other  negative endocrine ROS  Renal/GU negative Renal ROS     Musculoskeletal  (+) Arthritis ,   Abdominal   Peds  Hematology  (+) anemia ,   Anesthesia Other Findings   Reproductive/Obstetrics                           Lab Results  Component Value Date   WBC 4.1 03/15/2015   HGB 9.5* 03/15/2015   HCT 28.1* 03/15/2015   MCV 90.6 03/15/2015   PLT 208 03/15/2015   Lab Results  Component Value Date   CREATININE 1.19 03/15/2015   BUN 27* 03/15/2015   NA 132* 03/15/2015   K 4.3 03/15/2015   CL 100* 03/15/2015   CO2 26 03/15/2015   Lab Results  Component Value Date   INR 1.45 03/15/2015   INR 1.47 07/14/2014   INR 1.1 10/16/2007    Anesthesia Physical Anesthesia Plan  ASA: IV  Anesthesia Plan: General   Post-op Pain Management:    Induction: Intravenous  Airway Management Planned: Oral ETT  Additional Equipment: Arterial line, PA Cath, CVP, TEE and Ultrasound Guidance Line Placement  Intra-op Plan:   Post-operative Plan: Post-operative intubation/ventilation  Informed Consent: I have reviewed the patients History and Physical, chart, labs and discussed the  procedure including the risks, benefits and alternatives for the proposed anesthesia with the patient or authorized representative who has indicated his/her understanding and acceptance.   Dental advisory given  Plan Discussed with: CRNA  Anesthesia Plan Comments:        Anesthesia Quick Evaluation

## 2015-03-16 NOTE — Progress Notes (Signed)
Anesthesia Chart Review: Patient is a 74 year old male posted for Bentall Procedure, clipping of atrial appendage, possible Maze on 03/17/15 by Dr. Servando Snare.  History includes passive smoking exposure, COPD, afib s/p cardioversion 06/2014 and 10/01/14, CHF, non-ischemic CM (EF 20% 06/2014-->51% 12/2014), aortic root aneurysm, HTN, anxiety, peptic ulcers, cholecystectomy, hernia repair, bilateral TKA. PCP/Cardiologist is Dr. Terrence Dupont. Also followed by Dr. Aundra Dubin at the [facility]. Pulmonologist is Dr. Lake Bells.  Meds include Advair, Xanax, amiodarone, Eliquis, Lipitor, Coreg, lisinopril, ProAir, Spiriva, spironolactone. Eliquis held starting 03/11/15.  03/15/15 EKG: SB with first degree AVB, PACs, LAD, right BBB, LAFB, bifascicular block, septal infarct (age undetermined), T wave abnormality, consider lateral ischemia.  01/06/15 Cardiac MRI: IMPRESSION: 1. Dilated aortic root to 5.6 cm, described in detail separately on MRA report. 2. Trileaflet aortic valve with probably moderate aortic insufficiency due to dilation of the aortic valve annulus. 3. Mildly dilated left ventricle with mild diffuse hypokinesis, EF 51%. 4. Mildly dilated right ventricle with normal systolic function. 5. No definite myocardial LGE, so no definitive evidence for prior MI, infiltrative disease, or myocarditis.  08/07/14 Echo: Study Conclusions - Left ventricle: The cavity size was moderately dilated. The estimated ejection fraction was in the range of 15% to 20%. Wall motion was normal; there were no regional wall motion abnormalities. - Aortic valve: There was mild regurgitation. - Left atrium: The atrium was mildly dilated. - Atrial septum: No defect or patent foramen ovale was identified. - Pulmonary arteries: Systolic pressure was mildly increased.  07/14/14 Cardiac cath: LV showed LV moderately enlarged. Global hypokinesis. EF 20-25%. LM very short, patent. LAD patent. D1 and D2 patent. LCX patent. OM1  small, patent. OM2 moderate, patent. RCA patent. PDA and PLV branches patent.    07/14/14 Carotid duplex: Summary: - Bilateral - 1% to 39% ICA stenosis lower end of range. Vertebralartery flo is antegrade.  03/15/15 CXR: IMPRESSION: Hyperinflation consistent with reactive airway disease, stable. Stable previous granulomatous infection. There is no CHF, pneumonia, nor other acute cardiopulmonary abnormality.  07/13/14 PFTs: FVC 3.79 (75%), FEV1 2.15 (57%), DLCOunc 25.46 (67%). Moderate to severe obstructive disease.  Preoperative labs noted. Cr 1.19. H/H 9.5/28.1, down from 11.3/33.33 12/2014. T&S done. PT 17.7, INR 1.45. PTT 33. A1C 6.1. TCTS RN Ryan notified of Hgb, PT/INR and need for ABG DOS.  If Dr. Servando Snare feels labs are acceptable for OR and otherwise no acute changes then I would anticipate that he could proceed as planned.  George [facility] Short Stay Center/Anesthesiology Phone [phone] 03/16/2015 9:49 AM

## 2015-03-17 ENCOUNTER — Inpatient Hospital Stay (HOSPITAL_COMMUNITY): Payer: Medicare Other | Admitting: Critical Care Medicine

## 2015-03-17 ENCOUNTER — Inpatient Hospital Stay (HOSPITAL_COMMUNITY): Payer: Medicare Other | Admitting: Vascular Surgery

## 2015-03-17 ENCOUNTER — Encounter (HOSPITAL_COMMUNITY): Payer: Self-pay | Admitting: *Deleted

## 2015-03-17 ENCOUNTER — Encounter (HOSPITAL_COMMUNITY)
Admission: RE | Disposition: A | Discharge status: Skilled Nursing Facility | Payer: Medicare Other | Source: Ambulatory Visit | Attending: Cardiothoracic Surgery

## 2015-03-17 ENCOUNTER — Inpatient Hospital Stay (HOSPITAL_COMMUNITY): Payer: Medicare Other

## 2015-03-17 ENCOUNTER — Inpatient Hospital Stay (HOSPITAL_COMMUNITY)
Admission: RE | Admit: 2015-03-17 | Discharge: 2015-03-26 | DRG: 220 | Disposition: A | Discharge status: Skilled Nursing Facility | Payer: Medicare Other | Source: Ambulatory Visit | Attending: Cardiothoracic Surgery | Admitting: Cardiothoracic Surgery

## 2015-03-17 DIAGNOSIS — I420 Dilated cardiomyopathy: Secondary | ICD-10-CM | POA: Diagnosis present

## 2015-03-17 DIAGNOSIS — D696 Thrombocytopenia, unspecified: Secondary | ICD-10-CM | POA: Diagnosis present

## 2015-03-17 DIAGNOSIS — I700 Atherosclerosis of aorta: Secondary | ICD-10-CM | POA: Diagnosis not present

## 2015-03-17 DIAGNOSIS — Z7722 Contact with and (suspected) exposure to environmental tobacco smoke (acute) (chronic): Secondary | ICD-10-CM | POA: Diagnosis present

## 2015-03-17 DIAGNOSIS — E7800 Pure hypercholesterolemia, unspecified: Secondary | ICD-10-CM | POA: Diagnosis present

## 2015-03-17 DIAGNOSIS — R278 Other lack of coordination: Secondary | ICD-10-CM | POA: Diagnosis not present

## 2015-03-17 DIAGNOSIS — Z79899 Other long term (current) drug therapy: Secondary | ICD-10-CM | POA: Diagnosis not present

## 2015-03-17 DIAGNOSIS — J45909 Unspecified asthma, uncomplicated: Secondary | ICD-10-CM | POA: Diagnosis present

## 2015-03-17 DIAGNOSIS — I442 Atrioventricular block, complete: Secondary | ICD-10-CM | POA: Diagnosis present

## 2015-03-17 DIAGNOSIS — I10 Essential (primary) hypertension: Secondary | ICD-10-CM | POA: Diagnosis present

## 2015-03-17 DIAGNOSIS — I77810 Thoracic aortic ectasia: Secondary | ICD-10-CM | POA: Diagnosis present

## 2015-03-17 DIAGNOSIS — E871 Hypo-osmolality and hyponatremia: Secondary | ICD-10-CM | POA: Diagnosis present

## 2015-03-17 DIAGNOSIS — Z7901 Long term (current) use of anticoagulants: Secondary | ICD-10-CM

## 2015-03-17 DIAGNOSIS — I5042 Chronic combined systolic (congestive) and diastolic (congestive) heart failure: Secondary | ICD-10-CM | POA: Diagnosis present

## 2015-03-17 DIAGNOSIS — I2510 Atherosclerotic heart disease of native coronary artery without angina pectoris: Secondary | ICD-10-CM | POA: Diagnosis present

## 2015-03-17 DIAGNOSIS — E162 Hypoglycemia, unspecified: Secondary | ICD-10-CM | POA: Diagnosis present

## 2015-03-17 DIAGNOSIS — I711 Thoracic aortic aneurysm, ruptured: Secondary | ICD-10-CM | POA: Diagnosis not present

## 2015-03-17 DIAGNOSIS — R911 Solitary pulmonary nodule: Secondary | ICD-10-CM | POA: Diagnosis present

## 2015-03-17 DIAGNOSIS — D62 Acute posthemorrhagic anemia: Secondary | ICD-10-CM | POA: Diagnosis not present

## 2015-03-17 DIAGNOSIS — J9811 Atelectasis: Secondary | ICD-10-CM | POA: Diagnosis not present

## 2015-03-17 DIAGNOSIS — Z96653 Presence of artificial knee joint, bilateral: Secondary | ICD-10-CM | POA: Diagnosis present

## 2015-03-17 DIAGNOSIS — R0602 Shortness of breath: Secondary | ICD-10-CM | POA: Diagnosis not present

## 2015-03-17 DIAGNOSIS — M1990 Unspecified osteoarthritis, unspecified site: Secondary | ICD-10-CM | POA: Diagnosis present

## 2015-03-17 DIAGNOSIS — J449 Chronic obstructive pulmonary disease, unspecified: Secondary | ICD-10-CM | POA: Diagnosis present

## 2015-03-17 DIAGNOSIS — R918 Other nonspecific abnormal finding of lung field: Secondary | ICD-10-CM | POA: Diagnosis not present

## 2015-03-17 DIAGNOSIS — R2689 Other abnormalities of gait and mobility: Secondary | ICD-10-CM | POA: Diagnosis not present

## 2015-03-17 DIAGNOSIS — I480 Paroxysmal atrial fibrillation: Secondary | ICD-10-CM | POA: Diagnosis not present

## 2015-03-17 DIAGNOSIS — I272 Other secondary pulmonary hypertension: Secondary | ICD-10-CM | POA: Diagnosis present

## 2015-03-17 DIAGNOSIS — I482 Chronic atrial fibrillation: Secondary | ICD-10-CM | POA: Diagnosis present

## 2015-03-17 DIAGNOSIS — I5022 Chronic systolic (congestive) heart failure: Secondary | ICD-10-CM | POA: Diagnosis not present

## 2015-03-17 DIAGNOSIS — Z87891 Personal history of nicotine dependence: Secondary | ICD-10-CM

## 2015-03-17 DIAGNOSIS — I350 Nonrheumatic aortic (valve) stenosis: Secondary | ICD-10-CM | POA: Diagnosis not present

## 2015-03-17 DIAGNOSIS — K5900 Constipation, unspecified: Secondary | ICD-10-CM | POA: Diagnosis not present

## 2015-03-17 DIAGNOSIS — Z9689 Presence of other specified functional implants: Secondary | ICD-10-CM

## 2015-03-17 DIAGNOSIS — R079 Chest pain, unspecified: Secondary | ICD-10-CM | POA: Diagnosis not present

## 2015-03-17 DIAGNOSIS — M6281 Muscle weakness (generalized): Secondary | ICD-10-CM | POA: Diagnosis not present

## 2015-03-17 DIAGNOSIS — Z952 Presence of prosthetic heart valve: Secondary | ICD-10-CM

## 2015-03-17 DIAGNOSIS — E785 Hyperlipidemia, unspecified: Secondary | ICD-10-CM | POA: Diagnosis present

## 2015-03-17 DIAGNOSIS — I351 Nonrheumatic aortic (valve) insufficiency: Principal | ICD-10-CM | POA: Diagnosis present

## 2015-03-17 DIAGNOSIS — J939 Pneumothorax, unspecified: Secondary | ICD-10-CM

## 2015-03-17 DIAGNOSIS — I5032 Chronic diastolic (congestive) heart failure: Secondary | ICD-10-CM | POA: Diagnosis not present

## 2015-03-17 DIAGNOSIS — Z48812 Encounter for surgical aftercare following surgery on the circulatory system: Secondary | ICD-10-CM | POA: Diagnosis not present

## 2015-03-17 DIAGNOSIS — I509 Heart failure, unspecified: Secondary | ICD-10-CM | POA: Diagnosis not present

## 2015-03-17 HISTORY — PX: TEE WITHOUT CARDIOVERSION: SHX5443

## 2015-03-17 HISTORY — PX: MAZE: SHX5063

## 2015-03-17 HISTORY — PX: CLIPPING OF ATRIAL APPENDAGE: SHX5773

## 2015-03-17 HISTORY — PX: BENTALL PROCEDURE: SHX5058

## 2015-03-17 LAB — POCT I-STAT 7, (LYTES, BLD GAS, ICA,H+H)
Acid-base deficit: 2 mmol/L (ref 0.0–2.0)
Bicarbonate: 24.4 mEq/L — ABNORMAL HIGH (ref 20.0–24.0)
Calcium, Ion: 1.18 mmol/L (ref 1.13–1.30)
HCT: 28 % — ABNORMAL LOW (ref 39.0–52.0)
Hemoglobin: 9.5 g/dL — ABNORMAL LOW (ref 13.0–17.0)
O2 Saturation: 100 %
Patient temperature: 36.4
Potassium: 4.1 mmol/L (ref 3.5–5.1)
Sodium: 136 mmol/L (ref 135–145)
TCO2: 26 mmol/L (ref 0–100)
pCO2 arterial: 48.8 mmHg — ABNORMAL HIGH (ref 35.0–45.0)
pH, Arterial: 7.304 — ABNORMAL LOW (ref 7.350–7.450)
pO2, Arterial: 386 mmHg — ABNORMAL HIGH (ref 80.0–100.0)

## 2015-03-17 LAB — CBC
HCT: 30.9 % — ABNORMAL LOW (ref 39.0–52.0)
HEMATOCRIT: 23.9 % — AB (ref 39.0–52.0)
HEMOGLOBIN: 8.1 g/dL — AB (ref 13.0–17.0)
Hemoglobin: 10.9 g/dL — ABNORMAL LOW (ref 13.0–17.0)
MCH: 30.7 pg (ref 26.0–34.0)
MCH: 31.3 pg (ref 26.0–34.0)
MCHC: 33.9 g/dL (ref 30.0–36.0)
MCHC: 35.3 g/dL (ref 30.0–36.0)
MCV: 90.5 fL (ref 78.0–100.0)
MCV: 88.8 fL (ref 78.0–100.0)
PLATELETS: 90 10*3/uL — AB (ref 150–400)
Platelets: 161 10*3/uL (ref 150–400)
RBC: 2.64 MIL/uL — ABNORMAL LOW (ref 4.22–5.81)
RBC: 3.48 MIL/uL — ABNORMAL LOW (ref 4.22–5.81)
RDW: 13.2 % (ref 11.5–15.5)
RDW: 13.3 % (ref 11.5–15.5)
WBC: 3.4 10*3/uL — ABNORMAL LOW (ref 4.0–10.5)
WBC: 8.3 10*3/uL (ref 4.0–10.5)

## 2015-03-17 LAB — POCT I-STAT, CHEM 8
BUN: 29 mg/dL — ABNORMAL HIGH (ref 6–20)
BUN: 28 mg/dL — ABNORMAL HIGH (ref 6–20)
BUN: 25 mg/dL — ABNORMAL HIGH (ref 6–20)
BUN: 27 mg/dL — ABNORMAL HIGH (ref 6–20)
BUN: 26 mg/dL — ABNORMAL HIGH (ref 6–20)
BUN: 26 mg/dL — ABNORMAL HIGH (ref 6–20)
BUN: 25 mg/dL — ABNORMAL HIGH (ref 6–20)
BUN: 23 mg/dL — ABNORMAL HIGH (ref 6–20)
BUN: 22 mg/dL — ABNORMAL HIGH (ref 6–20)
Calcium, Ion: 1.25 mmol/L (ref 1.13–1.30)
Calcium, Ion: 1.27 mmol/L (ref 1.13–1.30)
Calcium, Ion: 1.11 mmol/L — ABNORMAL LOW (ref 1.13–1.30)
Calcium, Ion: 1.17 mmol/L (ref 1.13–1.30)
Calcium, Ion: 1.21 mmol/L (ref 1.13–1.30)
Calcium, Ion: 1.21 mmol/L (ref 1.13–1.30)
Calcium, Ion: 1.12 mmol/L — ABNORMAL LOW (ref 1.13–1.30)
Calcium, Ion: 1.15 mmol/L (ref 1.13–1.30)
Calcium, Ion: 1.12 mmol/L — ABNORMAL LOW (ref 1.13–1.30)
Chloride: 99 mmol/L — ABNORMAL LOW (ref 101–111)
Chloride: 99 mmol/L — ABNORMAL LOW (ref 101–111)
Chloride: 98 mmol/L — ABNORMAL LOW (ref 101–111)
Chloride: 99 mmol/L — ABNORMAL LOW (ref 101–111)
Chloride: 99 mmol/L — ABNORMAL LOW (ref 101–111)
Chloride: 100 mmol/L — ABNORMAL LOW (ref 101–111)
Chloride: 101 mmol/L (ref 101–111)
Chloride: 100 mmol/L — ABNORMAL LOW (ref 101–111)
Chloride: 101 mmol/L (ref 101–111)
Creatinine, Ser: 1 mg/dL (ref 0.61–1.24)
Creatinine, Ser: 1 mg/dL (ref 0.61–1.24)
Creatinine, Ser: 0.9 mg/dL (ref 0.61–1.24)
Creatinine, Ser: 0.9 mg/dL (ref 0.61–1.24)
Creatinine, Ser: 0.9 mg/dL (ref 0.61–1.24)
Creatinine, Ser: 0.9 mg/dL (ref 0.61–1.24)
Creatinine, Ser: 0.9 mg/dL (ref 0.61–1.24)
Creatinine, Ser: 0.9 mg/dL (ref 0.61–1.24)
Creatinine, Ser: 0.9 mg/dL (ref 0.61–1.24)
Glucose, Bld: 99 mg/dL (ref 65–99)
Glucose, Bld: 121 mg/dL — ABNORMAL HIGH (ref 65–99)
Glucose, Bld: 120 mg/dL — ABNORMAL HIGH (ref 65–99)
Glucose, Bld: 193 mg/dL — ABNORMAL HIGH (ref 65–99)
Glucose, Bld: 181 mg/dL — ABNORMAL HIGH (ref 65–99)
Glucose, Bld: 170 mg/dL — ABNORMAL HIGH (ref 65–99)
Glucose, Bld: 141 mg/dL — ABNORMAL HIGH (ref 65–99)
Glucose, Bld: 160 mg/dL — ABNORMAL HIGH (ref 65–99)
Glucose, Bld: 166 mg/dL — ABNORMAL HIGH (ref 65–99)
HCT: 23 % — ABNORMAL LOW (ref 39.0–52.0)
HCT: 24 % — ABNORMAL LOW (ref 39.0–52.0)
HCT: 22 % — ABNORMAL LOW (ref 39.0–52.0)
HCT: 22 % — ABNORMAL LOW (ref 39.0–52.0)
HCT: 24 % — ABNORMAL LOW (ref 39.0–52.0)
HCT: 21 % — ABNORMAL LOW (ref 39.0–52.0)
HCT: 22 % — ABNORMAL LOW (ref 39.0–52.0)
HCT: 21 % — ABNORMAL LOW (ref 39.0–52.0)
HCT: 25 % — ABNORMAL LOW (ref 39.0–52.0)
Hemoglobin: 7.8 g/dL — ABNORMAL LOW (ref 13.0–17.0)
Hemoglobin: 8.2 g/dL — ABNORMAL LOW (ref 13.0–17.0)
Hemoglobin: 7.5 g/dL — ABNORMAL LOW (ref 13.0–17.0)
Hemoglobin: 7.5 g/dL — ABNORMAL LOW (ref 13.0–17.0)
Hemoglobin: 8.2 g/dL — ABNORMAL LOW (ref 13.0–17.0)
Hemoglobin: 7.1 g/dL — ABNORMAL LOW (ref 13.0–17.0)
Hemoglobin: 7.5 g/dL — ABNORMAL LOW (ref 13.0–17.0)
Hemoglobin: 7.1 g/dL — ABNORMAL LOW (ref 13.0–17.0)
Hemoglobin: 8.5 g/dL — ABNORMAL LOW (ref 13.0–17.0)
Potassium: 4.4 mmol/L (ref 3.5–5.1)
Potassium: 4.5 mmol/L (ref 3.5–5.1)
Potassium: 4.7 mmol/L (ref 3.5–5.1)
Potassium: 5.7 mmol/L — ABNORMAL HIGH (ref 3.5–5.1)
Potassium: 5 mmol/L (ref 3.5–5.1)
Potassium: 4.7 mmol/L (ref 3.5–5.1)
Potassium: 4.8 mmol/L (ref 3.5–5.1)
Potassium: 4.1 mmol/L (ref 3.5–5.1)
Potassium: 4.7 mmol/L (ref 3.5–5.1)
Sodium: 133 mmol/L — ABNORMAL LOW (ref 135–145)
Sodium: 133 mmol/L — ABNORMAL LOW (ref 135–145)
Sodium: 134 mmol/L — ABNORMAL LOW (ref 135–145)
Sodium: 131 mmol/L — ABNORMAL LOW (ref 135–145)
Sodium: 132 mmol/L — ABNORMAL LOW (ref 135–145)
Sodium: 133 mmol/L — ABNORMAL LOW (ref 135–145)
Sodium: 134 mmol/L — ABNORMAL LOW (ref 135–145)
Sodium: 135 mmol/L (ref 135–145)
Sodium: 132 mmol/L — ABNORMAL LOW (ref 135–145)
TCO2: 27 mmol/L (ref 0–100)
TCO2: 28 mmol/L (ref 0–100)
TCO2: 26 mmol/L (ref 0–100)
TCO2: 26 mmol/L (ref 0–100)
TCO2: 26 mmol/L (ref 0–100)
TCO2: 24 mmol/L (ref 0–100)
TCO2: 23 mmol/L (ref 0–100)
TCO2: 24 mmol/L (ref 0–100)
TCO2: 20 mmol/L (ref 0–100)

## 2015-03-17 LAB — HEMOGLOBIN AND HEMATOCRIT, BLOOD
HCT: 20.7 % — ABNORMAL LOW (ref 39.0–52.0)
Hemoglobin: 7.1 g/dL — ABNORMAL LOW (ref 13.0–17.0)

## 2015-03-17 LAB — POCT I-STAT 3, ART BLOOD GAS (G3+)
Acid-Base Excess: 2 mmol/L (ref 0.0–2.0)
Acid-base deficit: 2 mmol/L (ref 0.0–2.0)
Acid-base deficit: 3 mmol/L — ABNORMAL HIGH (ref 0.0–2.0)
Bicarbonate: 26.6 mEq/L — ABNORMAL HIGH (ref 20.0–24.0)
Bicarbonate: 24.2 mEq/L — ABNORMAL HIGH (ref 20.0–24.0)
Bicarbonate: 21.4 mEq/L (ref 20.0–24.0)
O2 Saturation: 100 %
O2 Saturation: 100 %
O2 Saturation: 99 %
Patient temperature: 35.5
TCO2: 28 mmol/L (ref 0–100)
TCO2: 26 mmol/L (ref 0–100)
TCO2: 22 mmol/L (ref 0–100)
pCO2 arterial: 41.3 mmHg (ref 35.0–45.0)
pCO2 arterial: 48 mmHg — ABNORMAL HIGH (ref 35.0–45.0)
pCO2 arterial: 31.8 mmHg — ABNORMAL LOW (ref 35.0–45.0)
pH, Arterial: 7.417 (ref 7.350–7.450)
pH, Arterial: 7.311 — ABNORMAL LOW (ref 7.350–7.450)
pH, Arterial: 7.43 (ref 7.350–7.450)
pO2, Arterial: 449 mmHg — ABNORMAL HIGH (ref 80.0–100.0)
pO2, Arterial: 417 mmHg — ABNORMAL HIGH (ref 80.0–100.0)
pO2, Arterial: 130 mmHg — ABNORMAL HIGH (ref 80.0–100.0)

## 2015-03-17 LAB — BLOOD GAS, ARTERIAL

## 2015-03-17 LAB — POCT I-STAT 3, VENOUS BLOOD GAS (G3P V)
Bicarbonate: 24.6 mEq/L — ABNORMAL HIGH (ref 20.0–24.0)
O2 Saturation: 88 %
TCO2: 26 mmol/L (ref 0–100)
pCO2, Ven: 39.7 mmHg — ABNORMAL LOW (ref 45.0–50.0)
pH, Ven: 7.401 — ABNORMAL HIGH (ref 7.250–7.300)
pO2, Ven: 54 mmHg — ABNORMAL HIGH (ref 30.0–45.0)

## 2015-03-17 LAB — POCT I-STAT 4, (NA,K, GLUC, HGB,HCT)
Glucose, Bld: 62 mg/dL — ABNORMAL LOW (ref 65–99)
HCT: 29 % — ABNORMAL LOW (ref 39.0–52.0)
Hemoglobin: 9.9 g/dL — ABNORMAL LOW (ref 13.0–17.0)
Potassium: 4 mmol/L (ref 3.5–5.1)
Sodium: 134 mmol/L — ABNORMAL LOW (ref 135–145)

## 2015-03-17 LAB — PROTIME-INR
INR: 2.18 — ABNORMAL HIGH (ref 0.00–1.49)
PROTHROMBIN TIME: 24.1 s — AB (ref 11.6–15.2)

## 2015-03-17 LAB — GLUCOSE, CAPILLARY
Glucose-Capillary: 93 mg/dL (ref 65–99)
Glucose-Capillary: 29 mg/dL — CL (ref 65–99)
Glucose-Capillary: 96 mg/dL (ref 65–99)
Glucose-Capillary: 100 mg/dL — ABNORMAL HIGH (ref 65–99)
Glucose-Capillary: 111 mg/dL — ABNORMAL HIGH (ref 65–99)

## 2015-03-17 LAB — PREPARE RBC (CROSSMATCH)

## 2015-03-17 LAB — APTT: aPTT: 40 seconds — ABNORMAL HIGH (ref 24–37)

## 2015-03-17 LAB — FIBRINOGEN: Fibrinogen: 192 mg/dL — ABNORMAL LOW (ref 204–475)

## 2015-03-17 LAB — PLATELET COUNT: Platelets: 140 10*3/uL — ABNORMAL LOW (ref 150–400)

## 2015-03-17 SURGERY — BENTALL PROCEDURE
Anesthesia: General

## 2015-03-17 MED ORDER — PHENYLEPHRINE HCL 10 MG/ML IJ SOLN
10.0000 mg | INTRAVENOUS | Status: DC | PRN
  Administered 2015-03-17: 25 ug/min via INTRAVENOUS

## 2015-03-17 MED ORDER — PROTAMINE SULFATE 10 MG/ML IV SOLN
INTRAVENOUS | Status: AC
  Filled 2015-03-17: qty 25

## 2015-03-17 MED ORDER — METOPROLOL TARTRATE 12.5 MG HALF TABLET: 12.5000 mg | ORAL_TABLET | Freq: Once | ORAL | Status: DC

## 2015-03-17 MED ORDER — ROCURONIUM BROMIDE 50 MG/5ML IV SOLN
INTRAVENOUS | Status: AC
  Filled 2015-03-17: qty 1

## 2015-03-17 MED ORDER — SODIUM CHLORIDE 0.9 % IJ SOLN
INTRAMUSCULAR | Status: DC | PRN
  Administered 2015-03-17 (×4): 4 mL via TOPICAL

## 2015-03-17 MED ORDER — MIDAZOLAM HCL 2 MG/2ML IJ SOLN
INTRAMUSCULAR | Status: AC
  Filled 2015-03-17: qty 2

## 2015-03-17 MED ORDER — LACTATED RINGERS IV SOLN
INTRAVENOUS | Status: DC | PRN
  Administered 2015-03-17 (×2): via INTRAVENOUS

## 2015-03-17 MED ORDER — SODIUM CHLORIDE 0.9 % IV SOLN: INTRAVENOUS | Status: DC

## 2015-03-17 MED ORDER — EPHEDRINE SULFATE 50 MG/ML IJ SOLN
INTRAMUSCULAR | Status: DC | PRN
  Administered 2015-03-17: 10 mg via INTRAVENOUS
  Administered 2015-03-17 (×2): 5 mg via INTRAVENOUS

## 2015-03-17 MED ORDER — BISACODYL 10 MG RE SUPP: 10.0000 mg | Freq: Every day | RECTAL | Status: DC

## 2015-03-17 MED ORDER — SODIUM CHLORIDE 0.45 % IV SOLN
INTRAVENOUS | Status: DC | PRN
  Administered 2015-03-17: 20 mL via INTRAVENOUS

## 2015-03-17 MED ORDER — CHLORHEXIDINE GLUCONATE 0.12 % MT SOLN: 15.0000 mL | Freq: Once | OROMUCOSAL | Status: DC

## 2015-03-17 MED ORDER — SODIUM CHLORIDE 0.9 % IV SOLN: 250.0000 mL | INTRAVENOUS | Status: DC

## 2015-03-17 MED ORDER — 0.9 % SODIUM CHLORIDE (POUR BTL) OPTIME
TOPICAL | Status: DC | PRN
  Administered 2015-03-17: 6000 mL

## 2015-03-17 MED ORDER — TIOTROPIUM BROMIDE MONOHYDRATE 18 MCG IN CAPS
18.0000 ug | ORAL_CAPSULE | Freq: Every day | RESPIRATORY_TRACT | Status: DC
  Administered 2015-03-18 – 2015-03-26 (×7): 18 ug via RESPIRATORY_TRACT
  Filled 2015-03-17 (×2): qty 5

## 2015-03-17 MED ORDER — TRAMADOL HCL 50 MG PO TABS
50.0000 mg | ORAL_TABLET | ORAL | Status: DC | PRN
  Administered 2015-03-21: 50 mg via ORAL
  Filled 2015-03-17: qty 1

## 2015-03-17 MED ORDER — MORPHINE SULFATE (PF) 2 MG/ML IV SOLN: 1.0000 mg | INTRAVENOUS | Status: AC | PRN

## 2015-03-17 MED ORDER — METOPROLOL TARTRATE 25 MG/10 ML ORAL SUSPENSION
12.5000 mg | Freq: Two times a day (BID) | ORAL | Status: DC
Start: 2015-03-17 — End: 2015-03-19
  Filled 2015-03-17 (×4): qty 5

## 2015-03-17 MED ORDER — FAMOTIDINE IN NACL 20-0.9 MG/50ML-% IV SOLN
20.0000 mg | Freq: Two times a day (BID) | INTRAVENOUS | Status: AC
  Administered 2015-03-17: 20 mg via INTRAVENOUS

## 2015-03-17 MED ORDER — MILRINONE IN DEXTROSE 20 MG/100ML IV SOLN
0.1250 ug/kg/min | INTRAVENOUS | Status: DC
  Filled 2015-03-17: qty 100

## 2015-03-17 MED ORDER — SODIUM CHLORIDE 0.9 % IJ SOLN
3.0000 mL | Freq: Two times a day (BID) | INTRAMUSCULAR | Status: DC
Start: 2015-03-18 — End: 2015-03-23
  Administered 2015-03-18 – 2015-03-23 (×9): 3 mL via INTRAVENOUS

## 2015-03-17 MED ORDER — VANCOMYCIN HCL IN DEXTROSE 1-5 GM/200ML-% IV SOLN
1000.0000 mg | Freq: Once | INTRAVENOUS | Status: AC
  Administered 2015-03-17: 1000 mg via INTRAVENOUS
  Filled 2015-03-17: qty 200

## 2015-03-17 MED ORDER — ANTISEPTIC ORAL RINSE SOLUTION (CORINZ)
7.0000 mL | Freq: Four times a day (QID) | OROMUCOSAL | Status: DC
  Administered 2015-03-18 – 2015-03-20 (×8): 7 mL via OROMUCOSAL

## 2015-03-17 MED ORDER — DOPAMINE-DEXTROSE 3.2-5 MG/ML-% IV SOLN
0.0000 ug/kg/min | INTRAVENOUS | Status: DC
Start: 2015-03-17 — End: 2015-03-22
  Administered 2015-03-19 – 2015-03-20 (×2): 3 ug/kg/min via INTRAVENOUS
  Filled 2015-03-17: qty 250

## 2015-03-17 MED ORDER — ARTIFICIAL TEARS OP OINT
TOPICAL_OINTMENT | OPHTHALMIC | Status: DC | PRN
  Administered 2015-03-17: 1 via OPHTHALMIC

## 2015-03-17 MED ORDER — INSULIN REGULAR HUMAN 100 UNIT/ML IJ SOLN
INTRAMUSCULAR | Status: DC
  Filled 2015-03-17: qty 2.5

## 2015-03-17 MED ORDER — PROTAMINE SULFATE 10 MG/ML IV SOLN
INTRAVENOUS | Status: DC | PRN
  Administered 2015-03-17: 25 mg via INTRAVENOUS
  Administered 2015-03-17: 200 mg via INTRAVENOUS

## 2015-03-17 MED ORDER — DEXTROSE 50 % IV SOLN
INTRAVENOUS | Status: AC
  Filled 2015-03-17: qty 50

## 2015-03-17 MED ORDER — MAGNESIUM SULFATE 4 GM/100ML IV SOLN
4.0000 g | Freq: Once | INTRAVENOUS | Status: AC
  Administered 2015-03-17: 4 g via INTRAVENOUS
  Filled 2015-03-17: qty 100

## 2015-03-17 MED ORDER — SODIUM CHLORIDE 0.9 % IV SOLN
Freq: Once | INTRAVENOUS | Status: DC
Start: 2015-03-17 — End: 2015-03-17

## 2015-03-17 MED ORDER — INSULIN REGULAR BOLUS VIA INFUSION
0.0000 [IU] | Freq: Three times a day (TID) | INTRAVENOUS | Status: DC
  Filled 2015-03-17: qty 10

## 2015-03-17 MED ORDER — SODIUM CHLORIDE 0.9 % IJ SOLN
3.0000 mL | INTRAMUSCULAR | Status: DC | PRN
  Administered 2015-03-21: 3 mL via INTRAVENOUS
  Filled 2015-03-17: qty 3

## 2015-03-17 MED ORDER — MOMETASONE FURO-FORMOTEROL FUM 100-5 MCG/ACT IN AERO
2.0000 | INHALATION_SPRAY | Freq: Two times a day (BID) | RESPIRATORY_TRACT | Status: DC
  Administered 2015-03-18 – 2015-03-26 (×17): 2 via RESPIRATORY_TRACT
  Filled 2015-03-17: qty 8.8

## 2015-03-17 MED ORDER — ATORVASTATIN CALCIUM 20 MG PO TABS
20.0000 mg | ORAL_TABLET | Freq: Every day | ORAL | Status: DC
  Administered 2015-03-18 – 2015-03-26 (×9): 20 mg via ORAL
  Filled 2015-03-17 (×10): qty 1

## 2015-03-17 MED ORDER — OXYCODONE HCL 5 MG PO TABS
5.0000 mg | ORAL_TABLET | ORAL | Status: DC | PRN
  Administered 2015-03-18 – 2015-03-19 (×3): 5 mg via ORAL
  Filled 2015-03-17 (×3): qty 1

## 2015-03-17 MED ORDER — SODIUM CHLORIDE 0.9 % IV SOLN
INTRAVENOUS | Status: DC
  Filled 2015-03-17: qty 40

## 2015-03-17 MED ORDER — CHLORHEXIDINE GLUCONATE 0.12% ORAL RINSE (MEDLINE KIT)
15.0000 mL | Freq: Two times a day (BID) | OROMUCOSAL | Status: DC
  Administered 2015-03-17 – 2015-03-20 (×3): 15 mL via OROMUCOSAL

## 2015-03-17 MED ORDER — METOPROLOL TARTRATE 1 MG/ML IV SOLN: 2.5000 mg | INTRAVENOUS | Status: DC | PRN

## 2015-03-17 MED ORDER — LIDOCAINE HCL (CARDIAC) 20 MG/ML IV SOLN
INTRAVENOUS | Status: DC | PRN
  Administered 2015-03-17: 80 mg via INTRAVENOUS

## 2015-03-17 MED ORDER — ALBUMIN HUMAN 5 % IV SOLN
INTRAVENOUS | Status: DC | PRN
  Administered 2015-03-17: 15:00:00 via INTRAVENOUS

## 2015-03-17 MED ORDER — ACETAMINOPHEN 650 MG RE SUPP
650.0000 mg | Freq: Once | RECTAL | Status: AC
  Administered 2015-03-17: 650 mg via RECTAL

## 2015-03-17 MED ORDER — DEXTROSE 50 % IV SOLN
INTRAVENOUS | Status: AC
  Administered 2015-03-17: 16 mL
  Filled 2015-03-17: qty 50

## 2015-03-17 MED ORDER — LACTATED RINGERS IV SOLN: INTRAVENOUS | Status: DC

## 2015-03-17 MED ORDER — ACETAMINOPHEN 500 MG PO TABS
1000.0000 mg | ORAL_TABLET | Freq: Four times a day (QID) | ORAL | Status: AC
  Administered 2015-03-18 – 2015-03-22 (×9): 1000 mg via ORAL
  Filled 2015-03-17 (×18): qty 2

## 2015-03-17 MED ORDER — PANTOPRAZOLE SODIUM 40 MG PO TBEC
40.0000 mg | DELAYED_RELEASE_TABLET | Freq: Every day | ORAL | Status: DC
  Administered 2015-03-19 – 2015-03-23 (×5): 40 mg via ORAL
  Filled 2015-03-17 (×5): qty 1

## 2015-03-17 MED ORDER — DEXTROSE 50 % IV SOLN
28.0000 mL | Freq: Once | INTRAVENOUS | Status: DC
  Filled 2015-03-17: qty 50

## 2015-03-17 MED ORDER — NITROGLYCERIN IN D5W 200-5 MCG/ML-% IV SOLN: 0.0000 ug/min | INTRAVENOUS | Status: DC

## 2015-03-17 MED ORDER — SUCCINYLCHOLINE CHLORIDE 20 MG/ML IJ SOLN
INTRAMUSCULAR | Status: AC
  Filled 2015-03-17: qty 1

## 2015-03-17 MED ORDER — POTASSIUM CHLORIDE 10 MEQ/50ML IV SOLN: 10.0000 meq | INTRAVENOUS | Status: AC

## 2015-03-17 MED ORDER — MIDAZOLAM HCL 2 MG/2ML IJ SOLN: 2.0000 mg | INTRAMUSCULAR | Status: DC | PRN

## 2015-03-17 MED ORDER — FENTANYL CITRATE (PF) 250 MCG/5ML IJ SOLN
INTRAMUSCULAR | Status: AC
  Filled 2015-03-17: qty 20

## 2015-03-17 MED ORDER — FENTANYL CITRATE (PF) 250 MCG/5ML IJ SOLN
INTRAMUSCULAR | Status: AC
  Filled 2015-03-17: qty 5

## 2015-03-17 MED ORDER — VECURONIUM BROMIDE 10 MG IV SOLR
INTRAVENOUS | Status: DC | PRN
  Administered 2015-03-17 (×2): 5 mg via INTRAVENOUS
  Administered 2015-03-17: 10 mg via INTRAVENOUS
  Administered 2015-03-17: 5 mg via INTRAVENOUS

## 2015-03-17 MED ORDER — ASPIRIN EC 325 MG PO TBEC
325.0000 mg | DELAYED_RELEASE_TABLET | Freq: Every day | ORAL | Status: DC
  Administered 2015-03-18 – 2015-03-22 (×5): 325 mg via ORAL
  Filled 2015-03-17 (×6): qty 1

## 2015-03-17 MED ORDER — FENTANYL CITRATE (PF) 100 MCG/2ML IJ SOLN
INTRAMUSCULAR | Status: DC | PRN
  Administered 2015-03-17 (×2): 50 ug via INTRAVENOUS
  Administered 2015-03-17 (×3): 150 ug via INTRAVENOUS
  Administered 2015-03-17: 250 ug via INTRAVENOUS

## 2015-03-17 MED ORDER — PROPOFOL 10 MG/ML IV BOLUS
INTRAVENOUS | Status: AC
  Filled 2015-03-17: qty 20

## 2015-03-17 MED ORDER — MORPHINE SULFATE (PF) 2 MG/ML IV SOLN
2.0000 mg | INTRAVENOUS | Status: DC | PRN
  Administered 2015-03-19: 2 mg via INTRAVENOUS
  Filled 2015-03-17: qty 1

## 2015-03-17 MED ORDER — LACTATED RINGERS IV SOLN: 500.0000 mL | Freq: Once | INTRAVENOUS | Status: DC | PRN

## 2015-03-17 MED ORDER — ACETAMINOPHEN 160 MG/5ML PO SOLN: 1000.0000 mg | Freq: Four times a day (QID) | ORAL | Status: AC

## 2015-03-17 MED ORDER — ALBUMIN HUMAN 5 % IV SOLN
250.0000 mL | INTRAVENOUS | Status: AC | PRN
  Administered 2015-03-17 (×4): 250 mL via INTRAVENOUS
  Filled 2015-03-17: qty 250

## 2015-03-17 MED ORDER — HEPARIN SODIUM (PORCINE) 1000 UNIT/ML IJ SOLN
INTRAMUSCULAR | Status: DC | PRN
  Administered 2015-03-17: 25000 [IU] via INTRAVENOUS

## 2015-03-17 MED ORDER — CHLORHEXIDINE GLUCONATE 0.12 % MT SOLN: 15.0000 mL | OROMUCOSAL | Status: DC

## 2015-03-17 MED ORDER — ADULT MULTIVITAMIN W/MINERALS CH
1.0000 | ORAL_TABLET | Freq: Every day | ORAL | Status: DC
  Administered 2015-03-18 – 2015-03-23 (×6): 1 via ORAL
  Filled 2015-03-17 (×7): qty 1

## 2015-03-17 MED ORDER — DEXMEDETOMIDINE HCL IN NACL 200 MCG/50ML IV SOLN
INTRAVENOUS | Status: AC
Start: 2015-03-17 — End: 2015-03-17
  Filled 2015-03-17: qty 50

## 2015-03-17 MED ORDER — PHENYLEPHRINE HCL 10 MG/ML IJ SOLN
0.0000 ug/min | INTRAVENOUS | Status: DC
  Administered 2015-03-18: 15 ug/min via INTRAVENOUS
  Filled 2015-03-17 (×2): qty 2

## 2015-03-17 MED ORDER — DOCUSATE SODIUM 100 MG PO CAPS
200.0000 mg | ORAL_CAPSULE | Freq: Every day | ORAL | Status: DC
  Administered 2015-03-18 – 2015-03-23 (×6): 200 mg via ORAL
  Filled 2015-03-17 (×6): qty 2

## 2015-03-17 MED ORDER — ONDANSETRON HCL 4 MG/2ML IJ SOLN
4.0000 mg | Freq: Four times a day (QID) | INTRAMUSCULAR | Status: DC | PRN
  Administered 2015-03-18 – 2015-03-21 (×2): 4 mg via INTRAVENOUS
  Filled 2015-03-17 (×2): qty 2

## 2015-03-17 MED ORDER — HEMOSTATIC AGENTS (NO CHARGE) OPTIME
TOPICAL | Status: DC | PRN
  Administered 2015-03-17 (×3): 1 via TOPICAL

## 2015-03-17 MED ORDER — MIDAZOLAM HCL 5 MG/5ML IJ SOLN
INTRAMUSCULAR | Status: DC | PRN
  Administered 2015-03-17: 2 mg via INTRAVENOUS
  Administered 2015-03-17: 4 mg via INTRAVENOUS
  Administered 2015-03-17: 3 mg via INTRAVENOUS

## 2015-03-17 MED ORDER — MIDAZOLAM HCL 10 MG/2ML IJ SOLN
INTRAMUSCULAR | Status: AC
  Filled 2015-03-17: qty 2

## 2015-03-17 MED ORDER — DEXTROSE 5 % IV SOLN
1.5000 g | Freq: Two times a day (BID) | INTRAVENOUS | Status: AC
  Administered 2015-03-18 – 2015-03-19 (×4): 1.5 g via INTRAVENOUS
  Filled 2015-03-17 (×4): qty 1.5

## 2015-03-17 MED ORDER — METOPROLOL TARTRATE 12.5 MG HALF TABLET
12.5000 mg | ORAL_TABLET | Freq: Two times a day (BID) | ORAL | Status: DC
  Filled 2015-03-17 (×4): qty 1

## 2015-03-17 MED ORDER — CHLORHEXIDINE GLUCONATE 4 % EX LIQD: 30.0000 mL | CUTANEOUS | Status: DC

## 2015-03-17 MED ORDER — ROCURONIUM BROMIDE 100 MG/10ML IV SOLN
INTRAVENOUS | Status: DC | PRN
  Administered 2015-03-17: 50 mg via INTRAVENOUS

## 2015-03-17 MED ORDER — ASPIRIN 81 MG PO CHEW: 324.0000 mg | CHEWABLE_TABLET | Freq: Every day | ORAL | Status: DC

## 2015-03-17 MED ORDER — DEXMEDETOMIDINE HCL IN NACL 200 MCG/50ML IV SOLN: 0.0000 ug/kg/h | INTRAVENOUS | Status: DC

## 2015-03-17 MED ORDER — BISACODYL 5 MG PO TBEC
10.0000 mg | DELAYED_RELEASE_TABLET | Freq: Every day | ORAL | Status: DC
  Administered 2015-03-18 – 2015-03-23 (×6): 10 mg via ORAL
  Filled 2015-03-17 (×6): qty 2

## 2015-03-17 MED ORDER — PROTAMINE SULFATE 10 MG/ML IV SOLN
INTRAVENOUS | Status: AC
Start: 2015-03-17 — End: 2015-03-17
  Filled 2015-03-17: qty 5

## 2015-03-17 MED ORDER — ACETAMINOPHEN 160 MG/5ML PO SOLN: 650.0000 mg | Freq: Once | ORAL | Status: AC

## 2015-03-17 MED ORDER — DEXTROSE 5 % IV SOLN
0.7500 g | INTRAVENOUS | Status: DC | PRN
  Administered 2015-03-17: .75 g via INTRAVENOUS

## 2015-03-17 MED ORDER — HEPARIN SODIUM (PORCINE) 1000 UNIT/ML IJ SOLN
INTRAMUSCULAR | Status: AC
  Filled 2015-03-17: qty 1

## 2015-03-17 MED FILL — Magnesium Sulfate Inj 50%: INTRAMUSCULAR | Qty: 10 | Status: AC

## 2015-03-17 MED FILL — Heparin Sodium (Porcine) Inj 1000 Unit/ML: INTRAMUSCULAR | Qty: 30 | Status: AC

## 2015-03-17 MED FILL — Potassium Chloride Inj 2 mEq/ML: INTRAVENOUS | Qty: 40 | Status: AC

## 2015-03-17 SURGICAL SUPPLY — 109 items
ADAPTER CARDIO PERF ANTE/RETRO (ADAPTER) ×2 IMPLANT
APPLICATOR COTTON TIP 6IN STRL (MISCELLANEOUS) IMPLANT
APPLICATOR TIP COSEAL (VASCULAR PRODUCTS) ×4 IMPLANT
ATRICLIP EXCLUSION 35 STD HAND (Clip) ×2 IMPLANT
BAG DECANTER FOR FLEXI CONT (MISCELLANEOUS) ×2 IMPLANT
BLADE STERNUM SYSTEM 6 (BLADE) ×2 IMPLANT
BLADE SURG 15 STRL LF DISP TIS (BLADE) ×1 IMPLANT
BLADE SURG 15 STRL SS (BLADE) ×1
BOOT SUTURE AID YELLOW STND (SUTURE) ×2 IMPLANT
CABLE PACING FASLOC BIEGE (MISCELLANEOUS) ×2 IMPLANT
CANISTER SUCTION 2500CC (MISCELLANEOUS) ×2 IMPLANT
CANN PRFSN 3/8X14X24FR PCFC (MISCELLANEOUS) ×1
CANN PRFSN 3/8XCNCT ST RT ANG (MISCELLANEOUS) ×1
CANNULA GUNDRY RCSP 15FR (MISCELLANEOUS) ×2 IMPLANT
CANNULA PRFSN 3/8X14X24FR PCFC (MISCELLANEOUS) ×1 IMPLANT
CANNULA PRFSN 3/8XCNCT RT ANG (MISCELLANEOUS) ×1 IMPLANT
CANNULA SUMP PERICARDIAL (CANNULA) ×2 IMPLANT
CANNULA VEN MTL TIP RT (MISCELLANEOUS) ×2
CARDIOBLATE CARDIAC ABLATION (MISCELLANEOUS)
CATH CPB KIT GERHARDT (MISCELLANEOUS) ×2 IMPLANT
CATH HEART VENT LEFT (CATHETERS) ×1 IMPLANT
CATH ROBINSON RED A/P 18FR (CATHETERS) ×4 IMPLANT
CATH THORACIC 28FR (CATHETERS) ×2 IMPLANT
CATH/SQUID NICHOLS JEHLE COR (CATHETERS) IMPLANT
CAUTERY EYE LOW TEMP 1300F FIN (OPHTHALMIC RELATED) ×2 IMPLANT
CLAMP ISOLATOR SYNERGY LG (MISCELLANEOUS) ×2 IMPLANT
CLIP FOGARTY SPRING 6M (CLIP) IMPLANT
CONN ST 1/4X3/8  BEN (MISCELLANEOUS) ×1
CONN ST 1/4X3/8 BEN (MISCELLANEOUS) ×1 IMPLANT
CONN Y 3/8X3/8X3/8  BEN (MISCELLANEOUS) ×1
CONN Y 3/8X3/8X3/8 BEN (MISCELLANEOUS) ×1 IMPLANT
CONT SPEC 4OZ CLIKSEAL STRL BL (MISCELLANEOUS) ×4 IMPLANT
CRADLE DONUT ADULT HEAD (MISCELLANEOUS) ×2 IMPLANT
DEVICE CARDIOBLATE CARDIAC ABL (MISCELLANEOUS) IMPLANT
DRAIN CHANNEL 28F RND 3/8 FF (WOUND CARE) ×4 IMPLANT
DRAPE CARDIOVASCULAR INCISE (DRAPES) ×1
DRAPE SLUSH/WARMER DISC (DRAPES) ×2 IMPLANT
DRAPE SRG 135X102X78XABS (DRAPES) ×1 IMPLANT
DRSG AQUACEL AG ADV 3.5X14 (GAUZE/BANDAGES/DRESSINGS) ×2 IMPLANT
ELECT BLADE 4.0 EZ CLEAN MEGAD (MISCELLANEOUS) ×2
ELECT CAUTERY BLADE 6.4 (BLADE) ×2 IMPLANT
ELECT REM PT RETURN 9FT ADLT (ELECTROSURGICAL) ×4
ELECTRODE BLDE 4.0 EZ CLN MEGD (MISCELLANEOUS) ×1 IMPLANT
ELECTRODE REM PT RTRN 9FT ADLT (ELECTROSURGICAL) ×2 IMPLANT
FELT TEFLON 6X6 (MISCELLANEOUS) ×2 IMPLANT
GAUZE SPONGE 4X4 12PLY STRL (GAUZE/BANDAGES/DRESSINGS) ×2 IMPLANT
GLOVE BIO SURGEON STRL SZ 6.5 (GLOVE) ×10 IMPLANT
GLOVE BIO SURGEON STRL SZ7 (GLOVE) ×8 IMPLANT
GLOVE BIOGEL PI IND STRL 6.5 (GLOVE) ×5 IMPLANT
GLOVE BIOGEL PI IND STRL 7.0 (GLOVE) ×1 IMPLANT
GLOVE BIOGEL PI INDICATOR 6.5 (GLOVE) ×5
GLOVE BIOGEL PI INDICATOR 7.0 (GLOVE) ×1
GOWN STRL REUS W/ TWL LRG LVL3 (GOWN DISPOSABLE) ×8 IMPLANT
GOWN STRL REUS W/TWL LRG LVL3 (GOWN DISPOSABLE) ×8
GRAFT GELWEAVE VALSALVA 30CM (Prosthesis & Implant Heart) ×2 IMPLANT
HEMOSTAT POWDER SURGIFOAM 1G (HEMOSTASIS) ×8 IMPLANT
HEMOSTAT SURGICEL 2X14 (HEMOSTASIS) ×2 IMPLANT
INSERT FOGARTY XLG (MISCELLANEOUS) ×2 IMPLANT
KIT BASIN OR (CUSTOM PROCEDURE TRAY) ×2 IMPLANT
KIT CATH SUCT 8FR (CATHETERS) ×2 IMPLANT
KIT ROOM TURNOVER OR (KITS) ×2 IMPLANT
KIT SUCTION CATH 14FR (SUCTIONS) ×6 IMPLANT
LEAD PACING MYOCARDI (MISCELLANEOUS) ×4 IMPLANT
LINE VENT (MISCELLANEOUS) ×2 IMPLANT
LOOP VESSEL SUPERMAXI WHITE (MISCELLANEOUS) ×4 IMPLANT
NS IRRIG 1000ML POUR BTL (IV SOLUTION) ×12 IMPLANT
PACK OPEN HEART (CUSTOM PROCEDURE TRAY) ×2 IMPLANT
PAD ARMBOARD 7.5X6 YLW CONV (MISCELLANEOUS) ×8 IMPLANT
PROBE CRYO2-ABLATION MALLABLE (MISCELLANEOUS) IMPLANT
SEALANT SURG COSEAL 8ML (VASCULAR PRODUCTS) ×4 IMPLANT
SET CARDIOPLEGIA MPS 5001102 (MISCELLANEOUS) ×2 IMPLANT
SPONGE GAUZE 4X4 12PLY STER LF (GAUZE/BANDAGES/DRESSINGS) ×4 IMPLANT
STOPCOCK 4 WAY LG BORE MALE ST (IV SETS) IMPLANT
SUT BONE WAX W31G (SUTURE) ×2 IMPLANT
SUT ETHIBON 2 0 V 52N 30 (SUTURE) ×6 IMPLANT
SUT ETHIBOND 2 0 SH (SUTURE) ×9
SUT ETHIBOND 2 0 SH 36X2 (SUTURE) ×9 IMPLANT
SUT PROLENE 3 0 RB 1 (SUTURE) ×4 IMPLANT
SUT PROLENE 3 0 SH1 36 (SUTURE) ×16 IMPLANT
SUT PROLENE 4 0 RB 1 (SUTURE) ×4
SUT PROLENE 4 0 SH DA (SUTURE) ×2 IMPLANT
SUT PROLENE 4-0 RB1 .5 CRCL 36 (SUTURE) ×4 IMPLANT
SUT PROLENE 5 0 C 1 36 (SUTURE) ×10 IMPLANT
SUT PROLENE 6 0 C 1 30 (SUTURE) ×4 IMPLANT
SUT SILK  1 MH (SUTURE) ×3
SUT SILK 1 MH (SUTURE) ×3 IMPLANT
SUT SILK 1 TIES 10X30 (SUTURE) ×4 IMPLANT
SUT SILK 2 0 SH CR/8 (SUTURE) ×6 IMPLANT
SUT SILK 2 0 TIES 10X30 (SUTURE) ×2 IMPLANT
SUT SILK 3 0 SH CR/8 (SUTURE) ×2 IMPLANT
SUT SILK 4 0 TIE 10X30 (SUTURE) ×2 IMPLANT
SUT STEEL 6MS V (SUTURE) ×2 IMPLANT
SUT STEEL SZ 6 DBL 3X14 BALL (SUTURE) ×2 IMPLANT
SUT TEM PAC WIRE 2 0 SH (SUTURE) ×4 IMPLANT
SUT VIC AB 1 CTX 18 (SUTURE) ×4 IMPLANT
SUT VIC AB 2-0 CTX 27 (SUTURE) ×4 IMPLANT
SUT VIC AB 3-0 X1 27 (SUTURE) ×4 IMPLANT
SUTURE E-PAK OPEN HEART (SUTURE) IMPLANT
SYS ATRICLIP LAA EXCLUSION 45 (CLIP) IMPLANT
SYSTEM SAHARA CHEST DRAIN ATS (WOUND CARE) ×2 IMPLANT
TAPE CLOTH SURG 4X10 WHT LF (GAUZE/BANDAGES/DRESSINGS) ×2 IMPLANT
TAPES RETRACTO (MISCELLANEOUS) ×4 IMPLANT
TOWEL OR 17X24 6PK STRL BLUE (TOWEL DISPOSABLE) ×4 IMPLANT
TOWEL OR 17X26 10 PK STRL BLUE (TOWEL DISPOSABLE) ×4 IMPLANT
TRAY FOLEY IC TEMP SENS 16FR (CATHETERS) ×2 IMPLANT
UNDERPAD 30X30 INCONTINENT (UNDERPADS AND DIAPERS) ×2 IMPLANT
VALVE MAGNA EASE AORTIC 27MM (Prosthesis & Implant Heart) ×2 IMPLANT
VENT LEFT HEART 12002 (CATHETERS) ×2
WATER STERILE IRR 1000ML POUR (IV SOLUTION) ×4 IMPLANT

## 2015-03-17 NOTE — Brief Op Note (Addendum)
      [phone]       [place] [address]             [phone]      03/17/2015  2:56 PM  PATIENT:  [name]  74 y.o. male  PRE-OPERATIVE DIAGNOSIS:  1. Moderate  Aortic Insufficiency 2. History of Atrial Fibrillation 3. Severely Dilated aortic root  POST-OPERATIVE DIAGNOSIS: 1. Severe Aortic Insufficiency 2. History of  Atrial Fibrillation 3. Severly Dilated aortic root   PROCEDURE:  TRANSESOPHAGEAL ECHOCARDIOGRAM (TEE)MEDIAN STERNOTOMY for  Biologic BENTALL PROCEDURE (using a Pericardial Tissue Valve, size 27 mm), CLIPPING OF LEFT ATRIAL APPENDAGE, left side  MAZE   SURGEON:  Surgeon(s) and Role:    * Grace Isaac, MD - Primary  PHYSICIAN ASSISTANT: Lars Pinks PA-C  ANESTHESIA:   general  EBL:  Total I/O In: 800 [I.V.:800] Out: 32 [Urine:1100]  DRAINS: Chest tubes placed in the mediastinal and pleural spaces   SPECIMEN:  Source of Specimen:  Native aortic vavle leaflets and portion of aorta  DISPOSITION OF SPECIMEN:  PATHOLOGY  COUNTS CORRECT:  YES  DICTATION: .Dragon Dictation  PLAN OF CARE: Admit to inpatient   PATIENT DISPOSITION:  ICU - intubated and hemodynamically stable.   Delay start of Pharmacological VTE agent (>24hrs) due to surgical blood loss or risk of bleeding: yes  BASELINE WEIGHT: 70 kg  Aortic Valve Etiology   Aortic Insufficiency:  Severe  Aortic Valve Disease:  Yes.  Aortic Stenosis:  No.  Etiology (Choose at least one and up to  5 etiologies):  Degenerative - Pure annular dilation without leaflet prolapse  Aortic Valve  Procedure Performed:  Replacement: Yes.  Bioprosthetic Valve. Implant Model Number:[id number], Size:27, Unique Device Identifier:[id number]  Repair/Reconstruction: No.   Aortic Annular Enlargement: Yes.  Maze Procedure  Radiofrequency:  Yes.  Bipolar: Yes.  Cut-and-sew:  No.  Cryo: No   Lesions (select all that apply):    1   Pulmonary Vein Isolation and     7   LAA Ligation/Removal

## 2015-03-17 NOTE — Transfer of Care (Signed)
Immediate Anesthesia Transfer of Care Note  Patient: [name]  Procedure(s) Performed: Procedure(s): BIOLOGIC BENTALL PROCEDURE WITH 27 TISSUE VALVE AND 30 VALSALVA GRAFT (N/A) CLIPPING OF LEFT ATRIAL APPENDAGE (N/A) TRANSESOPHAGEAL ECHOCARDIOGRAM (TEE) (N/A) LEFT BIPOLAR MAZE (N/A)  Patient Location: PACU  Anesthesia Type:General  Level of Consciousness: Patient remains intubated per anesthesia plan  Airway & Oxygen Therapy: Patient remains intubated per anesthesia plan  Post-op Assessment: Report given to RN and Post -op Vital signs reviewed and stable  Post vital signs: stable  Last Vitals:  Filed Vitals:   03/17/15 0741  BP: 104/41  Pulse: 55  Temp: 36.6 C  Resp: 20    Complications: No apparent anesthesia complications

## 2015-03-17 NOTE — Progress Notes (Signed)
TCTS BRIEF SICU PROGRESS NOTE  Day of Surgery  S/P Procedure(s) (LRB): BIOLOGIC BENTALL PROCEDURE WITH 27 TISSUE VALVE AND 30 VALSALVA GRAFT (N/A) CLIPPING OF LEFT ATRIAL APPENDAGE (N/A) TRANSESOPHAGEAL ECHOCARDIOGRAM (TEE) (N/A) LEFT BIPOLAR MAZE (N/A)   Sedated on vent AV paced w/ stable hemodynamics, PA pressures low Chest tube output low UOP adequate Labs okay w/ Hgb 10.9 Platelet count 90k and INR 2.2 but not bleeding  Plan: Continue routine early postop.  May need more volume  Rexene Alberts, MD 03/17/2015 6:37 PM

## 2015-03-17 NOTE — H&P (Signed)
[address]       [place] [address]             [phone]                    [name] [place] Medical Record [id number] Date of Birth: May 10, 1940  Referring: [facility] Primary Care: Mark Forward, MD Heart Failure Clinic: Dr Aundra Dubin  Chief Complaint:    AI, AFIB and dilated aortic root  Patient is 74 year old male withhistory significant for mild coronary artery disease cath 2007, hypertension, prediabetic, hypercholesterolemia, degenerative joint disease, COPD, chronic atrial fibrillation, treated with rate control and anticoagulants. He was seen in the [facility] ER a few days before admission to [facility] in March 2016, had chest x-ray which was negative showed signs of COPD . He came to the [facility] ED for further evaluation complaining of progressive increasing shortness of breath associated with coughing for approximately 2 weeks. Patient was noted to be in A. fib with moderate ventricular response and  CHF. Patient received IV Lasix with improvement in his symptoms.  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 Cardiac catheterization and TEE were performed. Echocardiogram revealed ejection fraction of 10-15%. Because of this reason we have delayed on proceeding with prophylactic repair of his aortic root and ascending aorta.   Symptomatically his heart failure symptoms have continued to improve with aggressive treatment. He is followed closely in the heart failure clinic . He denies shortness of breath or pedal edema or orthopnea.   He is now back in sinus rhythm.  Cardiac MRI was obtained to evaluate his myocardium myocardial function and aortic root.  From a symptomatic standpoint the patient's much improved from when he was first seen back in March 2016   Past Medical History  Diagnosis Date  . A-fib   . Hypertension   . Asthma   . CHF (congestive heart failure)     History of GI Bleed In 1987  Previous history of bilaterial knee replacement, cholecystectomy, inguinal hernias repairs  Past Surgical History  Procedure Laterality Date  . Tee without cardioversion N/A 07/13/2014    Procedure: TRANSESOPHAGEAL ECHOCARDIOGRAM (TEE) WITH CARDIOVERSION;  Surgeon: Mark Dials, MD;  Location: [place];  Service: Cardiovascular;  Laterality: N/A;  . Cardioversion N/A 07/13/2014    Procedure: CARDIOVERSION;  Surgeon: Mark Dials, MD;  Location: [facility] ENDOSCOPY;  Service: Cardiovascular;  Laterality: N/A;  . Left and right heart catheterization with coronary angiogram N/A 07/14/2014    Procedure: LEFT AND RIGHT HEART CATHETERIZATION WITH CORONARY ANGIOGRAM;  Surgeon: Mark Forward, MD;  Location[facility] CATH LAB;  Service: Cardiovascular;  Laterality: N/A;  . Cardioversion N/A 10/01/2014    Procedure: CARDIOVERSION;  Surgeon: Larey Dresser, MD;  Location: [place];  Service: Cardiovascular;  Laterality: N/A;  . Cardiac catheterization    . Hernia repair    . Joint replacement      bilateral knee  . Cholecystectomy    . Colonoscopy    . Esophagogastroduodenoscopy     History  Smoking status  . smoker for 4-5 years quit in 1960  Smokeless tobacco  . none   History  Alcohol Use No    History   Social History  . Marital Status: Married    Spouse Name: N/A  . Number of Children: None    Occupational History  . retired from [employer]     No  Known Allergies  Current Facility-Administered Medications  Medication Dose Route Frequency Provider Last Rate Last Dose  . 0.9 % sodium chloride infusion 250 mL Intravenous PRN Mark Dials, MD    . 0.9 % sodium chloride infusion  Intravenous Continuous Mark Forward, MD  Stopped at 07/15/14 0436  . acetaminophen (TYLENOL) tablet 650 mg 650 mg Oral Q4H PRN Mark Forward, MD    . albuterol (PROVENTIL) (2.5 MG/3ML)  0.083% nebulizer solution 3 mL 3 mL Inhalation Q4H PRN Mark Forward, MD  3 mL at 07/13/14 1500  . ALPRAZolam Duanne Moron) tablet 0.25 mg 0.25 mg Oral BID Mark Forward, MD  0.25 mg at 07/14/14 2239  . amiodarone (PACERONE) tablet 200 mg 200 mg Oral BID Mark Forward, MD  200 mg at 07/14/14 2239  . apixaban (ELIQUIS) tablet 5 mg 5 mg Oral BID Mark Forward, MD  5 mg at 07/14/14 2239  . atorvastatin (LIPITOR) tablet 20 mg 20 mg Oral q1800 Mark Forward, MD  20 mg at 07/14/14 1700  . carvedilol (COREG) tablet 3.125 mg 3.125 mg Oral BID WC Mark Dials, MD  3.125 mg at 07/14/14 1657  . lisinopril (PRINIVIL,ZESTRIL) tablet 5 mg 5 mg Oral Daily Mark Forward, MD  5 mg at 07/14/14 1721  . mometasone-formoterol (DULERA) 100-5 MCG/ACT inhaler 2 puff 2 puff Inhalation BID Mark Forward, MD  2 puff at 07/15/14 0742  . ondansetron (ZOFRAN) injection 4 mg 4 mg Intravenous Q6H PRN Mark Forward, MD    . sodium chloride 0.9 % injection 3 mL 3 mL Intravenous Q12H Mark Forward, MD  3 mL at 07/12/14 0949  . sodium chloride 0.9 % injection 3 mL 3 mL Intravenous PRN Mark Forward, MD    . spironolactone (ALDACTONE) tablet 25 mg 25 mg Oral Daily Mark Forward, MD  25 mg at 07/14/14 1011  . tiotropium (SPIRIVA) inhalation capsule 18 mcg 18 mcg Inhalation Daily Mark Forward, MD  18 mcg at 07/15/14 [id number]    Prescriptions prior to admission  Medication Sig Dispense Refill Last Dose  . ADVAIR DISKUS 250-50 MCG/DOSE AEPB Inhale 1 puff into the lungs 2 (two) times daily.    07/09/2014 at Unknown time  . apixaban (ELIQUIS) 5 MG TABS tablet Take 5 mg by mouth 2 (two) times daily.   07/09/2014 at Unknown time  . atorvastatin (LIPITOR) 20 MG tablet Take 20 mg by mouth daily at 6 PM.    07/09/2014 at Unknown time  . azithromycin (ZITHROMAX) 250 MG tablet Take 250 mg by mouth  daily.    07/09/2014 at Unknown time  . metoprolol succinate (TOPROL-XL) 50 MG 24 hr tablet Take 100 mg by mouth daily.    07/09/2014 at 0800  . PROAIR HFA 108 (90 BASE) MCG/ACT inhaler Inhale 1 puff into the lungs every 4 (four) hours as needed for wheezing or shortness of breath.    07/09/2014 at Unknown time  . SPIRIVA HANDIHALER 18 MCG inhalation capsule Place 18 mcg into inhaler and inhale daily.    07/09/2014 at Unknown time  . spironolactone (ALDACTONE) 25 MG tablet Take 25 mg by mouth daily.    07/09/2014 at Unknown time    Family History: Only child , no children, father died suddenly at home at age 65 , ? MI No family history of dissection or aortic any serum   Review of Systems:    Cardiac Review of Systems: Y or N Chest Pain [ n ] Resting SOB [ y ]Exertional SOB [ y ] Vertell Limber Blue.Reese ]  Pedal Edema Blue.Reese ] Palpitations Blue.Reese ]Syncope [ n ] Presyncope [ y ] General Review of Systems: [Y] = yes [ ] =no Constitional: recent weight change Vixen.Miu ]; anorexia [ ] ; fatigue Blue.Reese ]; nausea [ ] ; night sweats [ ] ; fever [n ]; or chills [ n ]  Dental: poor dentition[ y ]; Last Dentist visit: Has upper plates and lower implants  Eye : blurred vision [ n ]; diplopia [ ] ; vision changes [ ] ; Amaurosis fugax[ ] ; Resp: cough Blue.Reese ]; wheezing[ y ]; hemoptysis[ n ]; shortness of breath[ y ]; paroxysmal nocturnal dyspnea[ ] ; dyspnea on exertion[y ]; or orthopnea[y ];  GI: gallstones[ ] , vomiting[ ] ; dysphagia[ ] ; melena[ ] ; hematochezia [ ] ; heartburn[ ] ; Hx of Colonoscopy[y ]; GU: kidney stones [ ] ; hematuria[ n ]; dysuria [ ] ; nocturia[ ] ; history of obstruction [ ] ; urinary frequency [ n ]  Skin: rash, swelling[ ] ;, hair loss[ ] ; peripheral edema[ ] ; or  itching[ ] ; Musculosketetal: myalgias[ ] ; joint swelling[ y ]; joint erythema[ y ]; joint pain[ ] ; back pain[ ] ; Heme/Lymph: bruising[ ] ; bleeding[ ] ; anemia[ ] ;  Neuro: TIA[ ] ; headaches[ ] ; stroke[ ] ; vertigo[ ] ; seizures[ n ]; paresthesias[ ] ; difficulty walking[ n ]; Psych:depression[ ] ; anxiety[ ] ; Endocrine: diabetes[n ]; thyroid dysfunction[ n ]; Immunizations: Flu [n ]; Pneumococcal[ n]; Other:  Physical Exam: There were no vitals taken for this visit.   General appearance: alert, cooperative, appears older than stated age and no distress Head: Normocephalic, without obvious abnormality, atraumatic Neck: no adenopathy, no carotid bruit, no JVD, supple, symmetrical, trachea midline and thyroid not enlarged, symmetric, no tenderness/mass/nodules Lymph nodes: Cervical, supraclavicular, and axillary nodes normal. Resp: clear to auscultation bilaterally Back: symmetric, no curvature. ROM normal. No CVA tenderness. Cardio: now regular Rhythm, ii/vi m of AI GI: soft, non-tender; bowel sounds normal; no masses, no organomegaly Extremities:  No pedal edema extremities normal, atraumatic, no cyanosis or edema and Homans sign is negative, no sign of DVT Neurologic: Grossly normal No carotid bruits Palpable dp and pt pulses, bilateral bilateral knee incisions from knee replacement  Negative thumb and wrist sign Palate is normal  Diagnostic Studies & Laboratory data:   Recent Radiology Findings:  Dg Chest 2 View  03/15/2015  CLINICAL DATA:  Aortic insufficiency, history of CHF, atrial fibrillation, and asthma. EXAM: CHEST  2 VIEW COMPARISON:  Portable chest x-ray dated July 10, 2014, and [place] and lateral chest x-ray of July 06, 2014. There is mild multilevel degenerative disc disease of the thoracic spine. FINDINGS: The lungs remain hyperinflated with hemidiaphragm flattening and increased AP  dimension of the thorax. There is no alveolar infiltrate. There is a calcified nodule in the right lower lobe posteriorly. The heart and pulmonary vascularity are normal. The mediastinum is normal in width. There is no pleural effusion. There is tortuosity of the descending thoracic aorta. IMPRESSION: Hyperinflation consistent with reactive airway disease, stable. Stable previous granulomatous infection. There is no CHF, pneumonia, nor other acute cardiopulmonary abnormality. Electronically Signed   By: Mark  Ellis M.D.   On: 03/15/2015 15:59    Mr Angiogram Chest W Wo Contrast  01/07/2015   CLINICAL DATA:  74 year old male with history of ischemic cardiomyopathy. Dilated aortic root. Follow-up examination.  EXAM: MRA CHEST WITH OR WITHOUT CONTRAST  TECHNIQUE: Angiographic images of the chest were obtained using MRA technique without and with intravenous contrast.  CONTRAST:  82mL MULTIHANCE GADOBENATE DIMEGLUMINE 529 MG/ML IV SOLN  COMPARISON:  CTA of the thorax 07/10/2014.  FINDINGS: Again  noted is aneurysmal dilatation of the aortic root. The aortic root measures approximately 5.6 cm in diameter at the level of the sinuses of Valsalva. Sino-tubular junction is effaced. Above this, the ascending aorta tapers to a normal caliber of 3.4 cm in diameter. Mid thoracic aortic arch measures up to 2.5 cm in diameter. Isthmus of the aorta measures up to 3.7 cm in diameter. Descending thoracic aorta measures up to 2.7 cm in diameter. Normal three-vessel arch anatomy noted.  IMPRESSION: 1. Aneurysmal dilatation of the aortic root redemonstrated, measuring approximately 5.6 cm at the level of the sinuses of Valsalva. Given the sinotubular junction effacement, clinical correlation for evidence of Marfan syndrome is recommended.   Electronically Signed   By: [name] M.D.   On: 01/07/2015 11:15   Mr Card Morphology Wo/w Cm  01/08/2015   CLINICAL DATA:  Cardiomyopathy, dilated aortic root.  EXAM: CARDIAC MRI   TECHNIQUE: The patient was scanned on a 1.5 Tesla GE magnet. A dedicated cardiac coil was used. Functional imaging was done using Fiesta sequences. 2,3, and 4 chamber views were done to assess for RWMA's. Modified Simpson's rule using a short axis stack was used to calculate an ejection fraction on a dedicated work Conservation officer, nature. The patient received 30 cc of Multihance. After 10 minutes inversion recovery sequences were used to assess for infiltration and scar tissue.  FINDINGS: MR angiography of chest reported separately.  No gross abnormalities on limited images of the lung fields. The aortic root was dilated to about 5.6 cm. This is reported separately.  Mildly dilated left ventricle with normal wall thickness. Mildly decreased LV systolic function, EF [id number] with diffuse hypokinesis. The right ventricle was mildly dilated with normal systolic function. Mildly dilated left and right atria. There was mild tricuspid regurgitation. There was no significant mitral regurgitation noted. The aortic valve was trileaflet with probably moderate aortic insufficiency and no stenosis. The aortic insufficiency was likely due to dilation of the aortic valve annulus.  On delayed enhancement imaging, there was no definite late gadolinium enhancement (LGE).  MEASUREMENTS: MEASUREMENTS LVEDV 244 mL  LV SV 125 mL  LV EF 51%  IMPRESSION: 1. Dilated aortic root to 5.6 cm, described in detail separately on MRA report.  2. Trileaflet aortic valve with probably moderate aortic insufficiency due to dilation of the aortic valve annulus.  3. Mildly dilated left ventricle with mild diffuse hypokinesis, EF 51%.  4.  Mildly dilated right ventricle with normal systolic function.  5. No definite myocardial LGE, so no definitive evidence for prior MI, infiltrative disease, or myocarditis.  [name]   Electronically Signed   By: [name] M.D.   On: 01/08/2015 07:32       Nm Pet Image Initial (pi) Skull Base To  Thigh  07/24/2014   CLINICAL DATA:  Initial treatment strategy for right upper lobe pulmonary nodule.  EXAM: NUCLEAR MEDICINE PET SKULL BASE TO THIGH  TECHNIQUE: 10.2 mCi F-18 FDG was injected intravenously. Full-ring PET imaging was performed from the skull base to thigh after the radiotracer. CT data was obtained and used for attenuation correction and anatomic localization.  FASTING BLOOD GLUCOSE:  Value: 109 mg/dl  COMPARISON:  CTA of the chest of 07/10/2014.  FINDINGS: NECK  No areas of abnormal hypermetabolism.  CHEST  No hypermetabolism within the right upper lobe. The nodule detailed on the prior exam likely represented branching vessels. Example image 32 of series 6 today. There is equivocal soft tissue fullness in  this area on image 72 of series 501 of the prior CT. No thoracic nodal hypermetabolism.  ABDOMEN/PELVIS  No areas of abnormal hypermetabolism.  SKELETON  Hypermetabolism involving a posterior right-sided cervical facet. Likely degenerative.  CT IMAGES PERFORMED FOR ATTENUATION CORRECTION  No cervical adenopathy.  Chest, abdomen, and pelvic findings deferred to recent diagnostic CT. No acute superimposed process. Cholecystectomy. Old granulomatous disease in the spleen. Prostatomegaly. Degenerative partial fusion of the bilateral sacroiliac joints.  IMPRESSION: No right upper lobe hypermetabolism to correspond to the right upper lobe Nodule detailed on the prior exam. This is favored to represent a branching vessel, when correlated with today's CT. As there is equivocal soft tissue fullness in this area on thin slice imaging of the prior diagnostic CTA, followup with chest CT at 6 months should be considered.   Electronically Signed   By: [name] M.D.   On: 07/24/2014 15:57   Dg Chest Portable 1 View   Ct Angio Chest Pe W/cm &/or Wo Cm  07/10/2014 CLINICAL DATA: 74 year old male with increasing shortness of breath for the past 2 weeks, cough, chest tightness and shortness of  breath, which worsens when lying flat. History of atrial fibrillation. History of thoracic aortic aneurysm. EXAM: CT ANGIOGRAPHY CHEST, ABDOMEN AND PELVIS TECHNIQUE: Multidetector CT imaging through the chest, abdomen and pelvis was performed using the standard protocol during bolus administration of intravenous contrast. Multiplanar reconstructed images and MIPs were obtained and reviewed to evaluate the vascular anatomy. CONTRAST: 155mL OMNIPAQUE IOHEXOL 350 MG/ML SOLN COMPARISON: None. FINDINGS: CTA CHEST FINDINGS Mediastinum/Lymph Nodes: Aneurysmal dilatation of the aortic root which measures 3.2 cm in diameter at the annulus, but 5.7 cm in diameter at the level of the sinuses of Valsalva. The sino-tubular junction is effaced. The thoracic aortic arch is normal in caliber at 2.8 cm. The isthmus of the thoracic aorta is mildly ectatic measuring 4 cm in diameter. The descending thoracic aorta is normal in caliber at 3 cm in diameter. No evidence of thoracic aortic dissection. Heart size is mildly enlarged. There is no significant pericardial fluid, thickening or pericardial calcification. There is atherosclerosis of the thoracic aorta, the great vessels of the mediastinum and the coronary arteries, including calcified atherosclerotic plaque in the left main and left anterior descending coronary arteries. Multiple borderline enlarged and mildly enlarged mediastinal and right hilar lymph nodes, largest of which is in the low right paratracheal nodal station measuring 14 mm in short axis. Multiple densely calcified right hilar and mediastinal lymph nodes, presumably from old granulomatous disease. Esophagus is normal in appearance. Multiple borderline enlarged and mildly enlarged left axillary lymph nodes measuring up to 10 mm in short axis. Lungs/Pleura: Large calcified granuloma in the posterior aspect of the right lower lobe with adjacent scarring. 12 x 7 mm right upper lobe pulmonary nodule (image 29 of  series 506). No acute consolidative airspace disease. No pleural effusions. Diffuse subpleural bullae are noted in the apices of the lungs bilaterally. Small left-sided Bochdalek's hernia. Musculoskeletal/Soft Tissues: There are no aggressive appearing lytic or blastic lesions noted in the visualized portions of the skeleton. Review of the MIP images confirms the above findings. CTA ABDOMEN AND PELVIS FINDINGS Hepatobiliary: Status post cholecystectomy. 8 mm low attenuation lesion in segment 6 of the liver (image 180 of series 501), too small to characterize, but statistically likely a tiny cyst. No other suspicious appearing hepatic lesions. No intra or extrahepatic biliary ductal dilatation. Pancreas: Unremarkable. Spleen: Multiple calcifications in the spleen, and irregular splenic  contour, which could relate to remote trauma or prior splenic infarcts. Adrenals/Urinary Tract: Bilateral kidneys and bilateral adrenal glands are normal in appearance. No hydroureteronephrosis. Urinary bladder is normal in appearance. Stomach/Bowel: Who normal appearance of the stomach. No pathologic dilatation of small bowel or colon. Normal appendix. Vascular/Lymphatic: Atherosclerosis throughout the abdominal and pelvic vasculature. Aneurysmal dilatation of the common iliac arteries bilaterally which measure up to 1.6 cm in diameter bilaterally. No evidence of dissection. No lymphadenopathy noted in the abdomen or pelvis. Reproductive: Prostate gland is enlarged and heterogeneous in appearance measuring 6.3 x 4.1 cm. Seminal vesicles are unremarkable in appearance. Other: No significant volume of ascites. No pneumoperitoneum. Musculoskeletal: There are no aggressive appearing lytic or blastic lesions noted in the visualized portions of the skeleton. Review of the MIP images confirms the above findings. IMPRESSION: 1. Aneurysmal dilatation of the aortic root, which measures up to 5.7 cm in diameter at the level of the  sinuses of Valsalva. There is effacement of the sino-tubular junction. This is commonly seen in the setting of Marfan syndrome. No evidence of aortic dissection at this time. There is also some ectasia at of the isthmus of the thoracic aorta which measures up to 4 cm in diameter. 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 2. 12 x 7 mm right upper lobe pulmonary nodule (image 29 of series 506). Followup evaluation with PET-CT is recommended in the near future to evaluate for potential malignancy. 3. Left main and left anterior descending coronary artery disease. Assessment for potential risk factor modification, dietary therapy or pharmacologic therapy may be warranted, if clinically indicated. 4. Mild cardiomegaly. 58. Sequela of old granulomatous disease, as above. Multiple borderline enlarged and mildly enlarged mediastinal and right hilar lymph nodes that are noncalcified are also noted, as well as borderline enlarged and minimally enlarged left axillary lymph nodes. These are nonspecific, but clinical correlation to exclude the possibility of underlying lymphoproliferative disorder may be appropriate. 6. Additional incidental findings, as above. Electronically Signed By: [name] M.D. On: 07/10/2014 21:47    I have independently reviewed the above radiologic studies.  Cardiac Cath:FINDINGS: LV showed LV was moderately enlarged. There was global hypokinesia. EF of approximately 20% to 25%. Left main was very short, which was patent. LAD was patent. Diagonal 1 and 2 were patent. Left circumflex was patent. OM-1 was small, which was patent. OM-2 was moderate sized, which was patent. RCA was patent. PDA and PLV branches were patent.  The patient tolerated the procedure well. There were no complications. The patient was transferred to recovery room in stable condition  PLAN: To maximize  beta-blockers and ACE inhibitors. We will start Eliquis tomorrow and possibly discharge tomorrow. The patient will be scheduled for PET scan as an outpatient and open heart surgery for aortic root replacement, possibly aortic valve as an outpatient.   [name], M.D.  I have independently reviewed the above cath films and reviewed the findings with the patient .   Recent Lab Findings:  Recent Labs    Lab Results  Component Value Date   WBC 4.8 07/15/2014   HGB 14.4 07/15/2014   HCT 43.4 07/15/2014   PLT 168 07/15/2014   GLUCOSE 113* 07/13/2014   ALT 149* 07/10/2014   AST 79* 07/10/2014   NA 138 07/13/2014   K 4.0 07/13/2014   CL 106 07/13/2014   CREATININE 0.88 07/13/2014   BUN 15 07/13/2014  CO2 24 07/13/2014   INR 1.47 07/14/2014     RecentCardiac CATH: 06/2014  FINDINGS: LV showed LV was moderately enlarged. There was global hypokinesia. EF of approximately 20% to 25%. Left main was very short, which was patent. LAD was patent. Diagonal 1 and 2 were patent. Left circumflex was patent. OM-1 was small, which was patent. OM-2 was moderate sized, which was patent. RCA was patent. PDA and PLV branches were patent.  The Swan findings were RA pressure was 3 mmHg, RV was 26/1, PA was 24/7, wedge pressure was 16. Cardiac output by Fick was 4.10 and by thermodilution it was 4.65.  REPEAT ECHO: 07/2014: ------------------------------------------------------------------- LV EF: 15% -  20%  ------------------------------------------------------------------- Indications:   CHF - 428.0.  ------------------------------------------------------------------- History:  PMH:  Congestive heart failure.  ------------------------------------------------------------------- Study Conclusions  - Left ventricle: The cavity size was moderately dilated. The estimated ejection fraction was  in the range of 15% to 20%. Wall motion was normal; there were no regional wall motion abnormalities. - Aortic valve: There was mild regurgitation. - Left atrium: The atrium was mildly dilated. - Atrial septum: No defect or patent foramen ovale was identified. - Pulmonary arteries: Systolic pressure was mildly increased.  Echocardiography. M-mode, complete 2D, spectral Doppler, and color Doppler. Birthdate: Patient birthdate: 11-12-1940. Age: Patient is 74 yr old. Sex: Gender: male.  BMI: 22.9 kg/m^2. Blood pressure:   114/72 Patient status: Outpatient. Study date: Study date: 08/07/2014. Study time: 10:02 AM. Location: Echo laboratory.  -------------------------------------------------------------------  ------------------------------------------------------------------- Left ventricle: The cavity size was moderately dilated. The estimated ejection fraction was in the range of 15% to 20%. Wall motion was normal; there were no regional wall motion abnormalities.  ------------------------------------------------------------------- Aortic valve:  Structurally normal valve.  Cusp separation was normal. Doppler: Transvalvular velocity was within the normal range. There was no stenosis. There was mild regurgitation.  ------------------------------------------------------------------- Aorta: The aorta was moderately dilated. There was moderate dilation of the sinus(es) of Valsalva. Aortic root: The aortic root was moderately dilated. Ascending aorta: The ascending aorta was moderately dilated.  ------------------------------------------------------------------- Mitral valve:  Structurally normal valve.  Leaflet separation was normal. Doppler: Transvalvular velocity was within the normal range. There was no evidence for stenosis. There was trivial regurgitation.  ------------------------------------------------------------------- Left atrium: The  atrium was mildly dilated.  ------------------------------------------------------------------- Atrial septum: No defect or patent foramen ovale was identified.  ------------------------------------------------------------------- Right ventricle: The cavity size was normal. Wall thickness was normal. Systolic function was normal.  ------------------------------------------------------------------- Pulmonic valve:  Doppler: There was no significant regurgitation.  ------------------------------------------------------------------- Tricuspid valve:  Structurally normal valve.  Leaflet separation was normal. Doppler: Transvalvular velocity was within the normal range. There was mild regurgitation.  ------------------------------------------------------------------- Pulmonary artery:  Systolic pressure was mildly increased.  ------------------------------------------------------------------- Right atrium: The atrium was normal in size.  ------------------------------------------------------------------- Pericardium: There was no pericardial effusion.  ------------------------------------------------------------------- Post procedure conclusions Ascending Aorta:  - The aorta was moderately dilated.  ------------------------------------------------------------------- Measurements  Left ventricle              Value    Reference LV ID, ED, PLAX chordal     (H)   70.7 mm   43 - 52 LV ID, ES, PLAX chordal     (H)   67.5 mm   23 - 38 LV fx shortening, PLAX chordal  (L)   5   %   >=29 LV PW thickness, ED           8.12 mm   --------- IVS/LV PW ratio,  ED           1.16     <=1.3 LV e&', lateral              8.49 cm/s  --------- LV E/e&', lateral             6.53     --------- LV e&', medial              6.09 cm/s  --------- LV E/e&', medial              9.1     --------- LV e&', average              7.29 cm/s  --------- LV E/e&', average             7.6     ---------  Ventricular septum            Value    Reference IVS thickness, ED            9.38 mm   ---------  Aortic valve               Value    Reference Aortic regurg pressure half-time     710  ms   ---------  Aorta                  Value    Reference Aortic root ID, ED            49  mm   ---------  Left atrium               Value    Reference LA ID, A-P, ES              44  mm   --------- LA ID/bsa, A-P              2.14 cm/m^2 <=2.2 LA volume, S               93.3 ml   --------- LA volume/bsa, S             45.5 ml/m^2 --------- LA volume, ES, 1-p A4C          74.6 ml   --------- LA volume/bsa, ES, 1-p A4C        36.4 ml/m^2 --------- LA volume, ES, 1-p A2C          107  ml   --------- LA volume/bsa, ES, 1-p A2C        52.2 ml/m^2 ---------  Mitral valve               Value    Reference Mitral E-wave peak velocity       55.4 cm/s  --------- Mitral A-wave peak velocity       39.8 cm/s  --------- Mitral deceleration time     (H)   246  ms   150 - 230 Mitral E/A ratio, peak          1.4     ---------  Pulmonary arteries            Value    Reference PA pressure, S, DP            20  mm Hg <=30  Tricuspid valve             Value    Reference Tricuspid regurg peak velocity      209  cm/s  --------- Tricuspid peak RV-RA gradient  17  mm Hg ---------  Systemic veins              Value    Reference Estimated CVP               3   mm Hg ---------  Right ventricle             Value    Reference RV pressure, S, DP            20  mm Hg <=30 RV s&', lateral, S            6.42 cm/s  ---------  Legend: (L) and (H) mark values outside specified reference range.  ------------------------------------------------------------------- Prepared and Electronically Authenticated by  Mark Forward, MD 2016-04-08T13:05:59  ------------------------------------------------------------------- PFT's 07/12/2014 21.5 57% DLCO 25.45 67% I does get Response to bronchodilator Airtrapping Mild Diffusion Defect  Aortic Size Index=     5.7    /There is no height or weight on file to calculate BSA. = 2.78  < 2.75 cm/m2      4% risk per year 2.75 to 4.25          8% risk per year > 4.25 cm/m2    20% risk per year  Wt Readings from Last 3 Encounters:  03/15/15 154 lb 11.2 oz (70.171 kg)  01/28/15 152 lb 12.8 oz (69.31 kg)  01/14/15 153 lb (69.4 kg)    Assessment / Plan:  1/ Aneurysmal dilatation of the aortic root, which measures up to  5.7 to 5.8  cm in diameter at the level of the sinuses of Valsalva. No evidence of aortic dissection at this time. 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 Now in sinus at  controlled rate The most recent cardiac MRI indicates the patient's ejection fraction has significantly improved with aggressive treatment to the 50% ejection fraction.  5/Left main (short)and left anterior descending coronary artery calcification on CT but without significant CAD at cath  6/ AFib in past has been in sinus rhythm for past 4   on Elliquis  7/ COPD- Moderately severe Obstructive Airways Disease, Response to bronchodilator, Airtrapping, Mild Diffusion Defect  Recommend:   With the patient's significant improvement in his overall functional status and LV function I believe we have now reached a point in his care that has been optimized and I have recommended to him that we proceed with aortic root and aortic valve replacement because of the risk of aortic dissection and continue worsening of aortic insufficiency. Risks and options were discussed with the patient in detail. He is agreeable with proceeding with a tissue valve. We discussed possibility of MAZE and atrial clip placement . Pateint made aware of use of [employer]  cooler at [facility] and very small risk of infection.  The goals risks and alternatives of the planned surgical procedure aortic root and valve replacement, possible MAZE and atrial clip  have been discussed with the patient in detail. The risks of the procedure including death, infection, stroke, myocardial infarction, bleeding, blood transfusion have all been discussed specifically.  I have quoted Marina Goodell a6 % of perioperative mortality and a complication rate as high as 40 %. The patient's questions have been answered.YANN LORENTZEN is willing  to proceed with the planned procedure.         Mark Isaac MD      [address] [place] [address] Office [phone]   Beeper [phone]  03/17/2015 7:20 AM

## 2015-03-17 NOTE — Anesthesia Postprocedure Evaluation (Signed)
  Anesthesia Post-op Note  Patient: [name]  Procedure(s) Performed: Procedure(s): BIOLOGIC BENTALL PROCEDURE WITH 27 TISSUE VALVE AND 30 VALSALVA GRAFT (N/A) CLIPPING OF LEFT ATRIAL APPENDAGE (N/A) TRANSESOPHAGEAL ECHOCARDIOGRAM (TEE) (N/A) LEFT BIPOLAR MAZE (N/A)  Patient Location: ICU  Anesthesia Type:General  Level of Consciousness: sedated  Airway and Oxygen Therapy: Patient remains intubated per anesthesia plan  Post-op Pain: none  Post-op Assessment: Post-op Vital signs reviewed              Post-op Vital Signs: Reviewed  Last Vitals:  Filed Vitals:   03/17/15 1745  BP:   Pulse: 89  Temp: 35.4 C  Resp: 12    Complications: No apparent anesthesia complications

## 2015-03-17 NOTE — Anesthesia Procedure Notes (Addendum)
Anesthesia Procedure Note Central line insertion note. Skin prepped and draped in sterile fashion. Patent vessel identified on u/s using linear probe. Needle advanced under live u/s guidance with aspiration of blood upon entry into vessel. Catheter passed easily over finder needle. Wire passed easily through catheter and location confirmed with u/s. Image saved in chart. Introducer catheter advanced over wire, with aspiration of blood through all ports for confirmation. Line sutured and dressing applied. Pt tolerated well with no immediate complications.  Deatra Canter, MD Procedure Name: Intubation Date/Time: 03/17/2015 8:40 AM Performed by: Lavell Luster Pre-anesthesia Checklist: Patient identified, Timeout performed, Emergency Drugs available, Suction available and Patient being monitored Patient Re-evaluated:Patient Re-evaluated prior to inductionOxygen Delivery Method: Circle system utilized Preoxygenation: Pre-oxygenation with 100% oxygen Intubation Type: IV induction and Cricoid Pressure applied Ventilation: Oral airway inserted - appropriate to patient size and Mask ventilation without difficulty Laryngoscope Size: Glidescope and 4 (first attempt with Mac 4 blade and unable to view epiglottis. second attempt with Sabra Heck 2 and only able to view tip of epiglottis. third attempt with glidescope successful) Grade View: Grade IV Tube type: Subglottic suction tube Tube size: 8.0 mm Number of attempts: 3 Airway Equipment and Method: Stylet Placement Confirmation: ETT inserted through vocal cords under direct vision,  breath sounds checked- equal and bilateral and positive ETCO2 Secured at: 23 cm Tube secured with: Tape Dental Injury: Teeth and Oropharynx as per pre-operative assessment  Difficulty Due To: Difficulty was anticipated and Difficult Airway- due to anterior larynx Future Recommendations: Recommend- induction with short-acting agent, and alternative techniques readily  available

## 2015-03-17 NOTE — Progress Notes (Signed)
Echocardiogram Echocardiogram Transesophageal has been performed.  [name] 03/17/2015, 9:28 AM

## 2015-03-18 ENCOUNTER — Encounter: Payer: Medicare Other | Admitting: Cardiothoracic Surgery

## 2015-03-18 ENCOUNTER — Inpatient Hospital Stay (HOSPITAL_COMMUNITY): Payer: Medicare Other

## 2015-03-18 ENCOUNTER — Encounter (HOSPITAL_COMMUNITY): Payer: Self-pay | Admitting: Cardiothoracic Surgery

## 2015-03-18 LAB — POCT I-STAT, CHEM 8
BUN: 21 mg/dL — ABNORMAL HIGH (ref 6–20)
Calcium, Ion: 1.17 mmol/L (ref 1.13–1.30)
Chloride: 98 mmol/L — ABNORMAL LOW (ref 101–111)
Creatinine, Ser: 0.9 mg/dL (ref 0.61–1.24)
Glucose, Bld: 217 mg/dL — ABNORMAL HIGH (ref 65–99)
HCT: 22 % — ABNORMAL LOW (ref 39.0–52.0)
Hemoglobin: 7.5 g/dL — ABNORMAL LOW (ref 13.0–17.0)
Potassium: 5 mmol/L (ref 3.5–5.1)
Sodium: 132 mmol/L — ABNORMAL LOW (ref 135–145)
TCO2: 18 mmol/L (ref 0–100)

## 2015-03-18 LAB — POCT I-STAT 3, ART BLOOD GAS (G3+)
Acid-base deficit: 5 mmol/L — ABNORMAL HIGH (ref 0.0–2.0)
Bicarbonate: 21.2 mEq/L (ref 20.0–24.0)
Bicarbonate: 22.9 mEq/L (ref 20.0–24.0)
O2 Saturation: 100 %
O2 Saturation: 98 %
Patient temperature: 36.6
Patient temperature: 36.9
TCO2: 23 mmol/L (ref 0–100)
TCO2: 24 mmol/L (ref 0–100)
pCO2 arterial: 28.4 mmHg — ABNORMAL LOW (ref 35.0–45.0)
pCO2 arterial: 44.7 mmHg (ref 35.0–45.0)
pH, Arterial: 7.281 — ABNORMAL LOW (ref 7.350–7.450)
pH, Arterial: 7.514 — ABNORMAL HIGH (ref 7.350–7.450)
pO2, Arterial: 122 mmHg — ABNORMAL HIGH (ref 80.0–100.0)
pO2, Arterial: 387 mmHg — ABNORMAL HIGH (ref 80.0–100.0)

## 2015-03-18 LAB — GLUCOSE, CAPILLARY
Glucose-Capillary: 128 mg/dL — ABNORMAL HIGH (ref 65–99)
Glucose-Capillary: 137 mg/dL — ABNORMAL HIGH (ref 65–99)
Glucose-Capillary: 163 mg/dL — ABNORMAL HIGH (ref 65–99)
Glucose-Capillary: 170 mg/dL — ABNORMAL HIGH (ref 65–99)
Glucose-Capillary: 183 mg/dL — ABNORMAL HIGH (ref 65–99)

## 2015-03-18 LAB — CBC
HCT: 26.7 % — ABNORMAL LOW (ref 39.0–52.0)
HCT: 26.7 % — ABNORMAL LOW (ref 39.0–52.0)
HCT: 23.7 % — ABNORMAL LOW (ref 39.0–52.0)
HEMOGLOBIN: 9.4 g/dL — AB (ref 13.0–17.0)
HEMOGLOBIN: 8.1 g/dL — AB (ref 13.0–17.0)
Hemoglobin: 9.5 g/dL — ABNORMAL LOW (ref 13.0–17.0)
MCH: 31.5 pg (ref 26.0–34.0)
MCH: 31.2 pg (ref 26.0–34.0)
MCH: 30.5 pg (ref 26.0–34.0)
MCHC: 35.6 g/dL (ref 30.0–36.0)
MCHC: 35.2 g/dL (ref 30.0–36.0)
MCHC: 34.2 g/dL (ref 30.0–36.0)
MCV: 88.4 fL (ref 78.0–100.0)
MCV: 88.7 fL (ref 78.0–100.0)
MCV: 89.1 fL (ref 78.0–100.0)
PLATELETS: 73 10*3/uL — AB (ref 150–400)
Platelets: 78 10*3/uL — ABNORMAL LOW (ref 150–400)
Platelets: 79 10*3/uL — ABNORMAL LOW (ref 150–400)
RBC: 3.02 MIL/uL — ABNORMAL LOW (ref 4.22–5.81)
RBC: 3.01 MIL/uL — AB (ref 4.22–5.81)
RBC: 2.66 MIL/uL — AB (ref 4.22–5.81)
RDW: 13.3 % (ref 11.5–15.5)
RDW: 13.6 % (ref 11.5–15.5)
RDW: 13.8 % (ref 11.5–15.5)
WBC: 8.4 10*3/uL (ref 4.0–10.5)
WBC: 10.9 10*3/uL — AB (ref 4.0–10.5)
WBC: 11.6 10*3/uL — ABNORMAL HIGH (ref 4.0–10.5)

## 2015-03-18 LAB — BASIC METABOLIC PANEL
ANION GAP: 6 (ref 5–15)
BUN: 20 mg/dL (ref 6–20)
CHLORIDE: 105 mmol/L (ref 101–111)
CO2: 21 mmol/L — ABNORMAL LOW (ref 22–32)
Calcium: 8 mg/dL — ABNORMAL LOW (ref 8.9–10.3)
Creatinine, Ser: 1.04 mg/dL (ref 0.61–1.24)
GFR calc Af Amer: >60 mL/min (ref >60)
GFR calc non Af Amer: >60 mL/min (ref >60)
Glucose, Bld: 171 mg/dL — ABNORMAL HIGH (ref 65–99)
POTASSIUM: 4.5 mmol/L (ref 3.5–5.1)
SODIUM: 132 mmol/L — AB (ref 135–145)

## 2015-03-18 LAB — CREATININE, SERUM
CREATININE: 1.01 mg/dL (ref 0.61–1.24)
Creatinine, Ser: 1.06 mg/dL (ref 0.61–1.24)
GFR calc Af Amer: >60 mL/min (ref >60)
GFR calc Af Amer: >60 mL/min (ref >60)
GFR calc non Af Amer: >60 mL/min (ref >60)
GFR calc non Af Amer: >60 mL/min (ref >60)

## 2015-03-18 LAB — MAGNESIUM
Magnesium: 2.8 mg/dL — ABNORMAL HIGH (ref 1.7–2.4)
Magnesium: 2.5 mg/dL — ABNORMAL HIGH (ref 1.7–2.4)
Magnesium: 2.3 mg/dL (ref 1.7–2.4)

## 2015-03-18 MED ORDER — INSULIN ASPART 100 UNIT/ML ~~LOC~~ SOLN
0.0000 [IU] | SUBCUTANEOUS | Status: DC
  Administered 2015-03-18 – 2015-03-20 (×4): 2 [IU] via SUBCUTANEOUS

## 2015-03-18 MED ORDER — INSULIN ASPART 100 UNIT/ML ~~LOC~~ SOLN
0.0000 [IU] | SUBCUTANEOUS | Status: DC
  Administered 2015-03-18: 4 [IU] via SUBCUTANEOUS

## 2015-03-18 MED ORDER — INSULIN ASPART 100 UNIT/ML ~~LOC~~ SOLN
0.0000 [IU] | SUBCUTANEOUS | Status: DC
  Administered 2015-03-18: 2 [IU] via SUBCUTANEOUS
  Administered 2015-03-18 – 2015-03-19 (×3): 4 [IU] via SUBCUTANEOUS

## 2015-03-18 MED ORDER — ALBUMIN HUMAN 5 % IV SOLN
12.5000 g | Freq: Once | INTRAVENOUS | Status: AC
  Administered 2015-03-18: 12.5 g via INTRAVENOUS
  Filled 2015-03-18: qty 250

## 2015-03-18 MED ORDER — INSULIN DETEMIR 100 UNIT/ML ~~LOC~~ SOLN
8.0000 [IU] | Freq: Every day | SUBCUTANEOUS | Status: DC
  Administered 2015-03-18 – 2015-03-20 (×3): 8 [IU] via SUBCUTANEOUS
  Filled 2015-03-18 (×4): qty 0.08

## 2015-03-18 MED ORDER — FE FUMARATE-B12-VIT C-FA-IFC PO CAPS
1.0000 | ORAL_CAPSULE | Freq: Three times a day (TID) | ORAL | Status: DC
  Administered 2015-03-18: 1 via ORAL
  Filled 2015-03-18 (×4): qty 1

## 2015-03-18 MED FILL — Lidocaine HCl IV Inj 20 MG/ML: INTRAVENOUS | Qty: 5 | Status: AC

## 2015-03-18 MED FILL — Sodium Chloride IV Soln 0.9%: INTRAVENOUS | Qty: 3000 | Status: AC

## 2015-03-18 MED FILL — Heparin Sodium (Porcine) Inj 1000 Unit/ML: INTRAMUSCULAR | Qty: 20 | Status: AC

## 2015-03-18 MED FILL — Sodium Bicarbonate IV Soln 8.4%: INTRAVENOUS | Qty: 50 | Status: AC

## 2015-03-18 MED FILL — Electrolyte-R (PH 7.4) Solution: INTRAVENOUS | Qty: 4000 | Status: AC

## 2015-03-18 MED FILL — Mannitol IV Soln 20%: INTRAVENOUS | Qty: 500 | Status: AC

## 2015-03-18 NOTE — Procedures (Signed)
Extubation Procedure Note  Patient Details:   Name: [name] DOB: 1940-05-17 MRN: [id number]   Airway Documentation:     Evaluation  O2 sats: stable throughout Complications: No apparent complications Patient did tolerate procedure well. Bilateral Breath Sounds: Clear, Diminished Suctioning: Airway Yes  Patient extubated at 0026 without any complications. Placed patient on 4LNC. Patient was able to give strong cough, as well as speak.  Blanchie Serve 03/18/2015, 4:18 AM

## 2015-03-18 NOTE — Progress Notes (Addendum)
TCTS DAILY ICU PROGRESS NOTE                   [address]            [place] [address]          [phone]   1 Day Post-Op Procedure(s) (LRB): BIOLOGIC BENTALL PROCEDURE WITH 27 TISSUE VALVE AND 30 VALSALVA GRAFT (N/A) CLIPPING OF LEFT ATRIAL APPENDAGE (N/A) TRANSESOPHAGEAL ECHOCARDIOGRAM (TEE) (N/A) LEFT BIPOLAR MAZE (N/A)  Total Length of Stay:  LOS: 1 day   Subjective:  [name] was extubated early this morning.  He denies pain.  States he is a little hoarse.  Objective: Vital signs in last 24 hours: Temp:  [95.5 F (35.3 C)-99.5 F (37.5 C)] 96.4 F (35.8 C) (11/17 0730) Pulse Rate:  [55-90] 88 (11/17 0730) Cardiac Rhythm:  [-] Atrial paced (11/17 0726) Resp:  [10-20] 10 (11/17 0730) BP: (82-120)/(41-76) 107/47 mmHg (11/17 0725) SpO2:  [96 %-100 %] 100 % (11/17 0730) Arterial Line BP: (73-138)/(39-71) 115/51 mmHg (11/17 0730) FiO2 (%):  [40 %-50 %] 40 % (11/17 0005) Weight:  [154 lb 11.2 oz (70.171 kg)-161 lb (73.029 kg)] 161 lb (73.029 kg) (11/17 0400)  Filed Weights   03/17/15 0741 03/18/15 0400  Weight: 154 lb 11.2 oz (70.171 kg) 161 lb (73.029 kg)    Weight change:    Hemodynamic parameters for last 24 hours: PAP: (15-33)/(4-21) 19/11 mmHg CO:  [3.6 L/min-5.8 L/min] 5.8 L/min CI:  [1.9 L/min/m2-3 L/min/m2] 3 L/min/m2  Intake/Output from previous day: 11/16 0701 - 11/17 0700 In: 5628.3 [I.V.:3593.3; Blood:335; IV Piggyback:1700] Out: [id number] [Urine:2665; Blood:2450; Chest Tube:520]  Intake/Output this shift: Total I/O In: 250 [IV Piggyback:250] Out: -   Current Meds: Scheduled Meds: . acetaminophen  1,000 mg Oral 4 times per day   Or  . acetaminophen (TYLENOL) oral liquid 160 mg/5 mL  1,000 mg Per Tube 4 times per day  . antiseptic oral rinse  7 mL Mouth Rinse QID  . aspirin EC  325 mg Oral Daily   Or  . aspirin  324 mg Per Tube Daily  . atorvastatin  20 mg Oral q1800  . bisacodyl  10 mg Oral Daily   Or  . bisacodyl  10  mg Rectal Daily  . cefUROXime (ZINACEF)  IV  1.5 g Intravenous Q12H  . chlorhexidine gluconate  15 mL Mouth Rinse BID  . dextrose  28 mL Intravenous Once  . docusate sodium  200 mg Oral Daily  . famotidine (PEPCID) IV  20 mg Intravenous Q12H  . insulin aspart  0-24 Units Subcutaneous 6 times per day  . insulin aspart  0-24 Units Subcutaneous 6 times per day  . metoprolol tartrate  12.5 mg Oral BID   Or  . metoprolol tartrate  12.5 mg Per Tube BID  . mometasone-formoterol  2 puff Inhalation BID  . multivitamin with minerals  1 tablet Oral Daily  . [START ON 03/19/2015] pantoprazole  40 mg Oral Daily  . sodium chloride  3 mL Intravenous Q12H  . tiotropium  18 mcg Inhalation Daily   Continuous Infusions: . sodium chloride 20 mL/hr at 03/18/15 0700  . sodium chloride    . sodium chloride 20 mL/hr at 03/18/15 0700  . dexmedetomidine Stopped (03/17/15 2210)  . DOPamine 3 mcg/kg/min (03/18/15 0700)  . lactated ringers    . lactated ringers 20 mL/hr at 03/18/15 0700  . nitroGLYCERIN    . phenylephrine (NEO-SYNEPHRINE) Adult infusion 25  mcg/min (03/18/15 0725)   PRN Meds:.sodium chloride, lactated ringers, metoprolol, midazolam, morphine injection, ondansetron (ZOFRAN) IV, oxyCODONE, sodium chloride, traMADol  General appearance: alert, cooperative and no distress Heart: regular rate and rhythm Lungs: clear to auscultation bilaterally Abdomen: soft, non-tender; bowel sounds normal; no masses,  no organomegaly Extremities: edema trace Wound: Aquacel in place  Lab Results: CBC: Recent Labs  03/17/15 2315 03/17/15 2321 03/18/15 0415  WBC 8.4  --  10.9*  HGB 9.5* 8.5* 9.4*  HCT 26.7* 25.0* 26.7*  PLT 78*  --  79*   BMET:  Recent Labs  03/15/15 1341  03/17/15 2321 03/18/15 0415  NA 132*  < > 132* 132*  K 4.3  < > 4.7 4.5  CL 100*  < > 101 105  CO2 26  --   --  21*  GLUCOSE 99  < > 166* 171*  BUN 27*  < > 22* 20  CREATININE 1.19  < > 0.90 1.04  CALCIUM 9.3  --   --   8.0*  < > = values in this interval not displayed.  PT/INR:  Recent Labs  03/17/15 1730  LABPROT 24.1*  INR 2.18*   Radiology: Dg Chest Port 1 View  03/17/2015  CLINICAL DATA:  Postop aortic valve replacement.  Initial encounter. EXAM: PORTABLE CHEST 1 VIEW COMPARISON:  03/15/2015 radiographs. FINDINGS: 1724 hours. Endotracheal tube tip is in the midtrachea. Right IJ Swan-Ganz catheter tip is in the main pulmonary artery. There is a probable mediastinal drain or medial right-sided chest tube. The heart size is stable status post median sternotomy and aortic valve replacement. There is mild atelectasis at both lung bases. No pneumothorax or significant pleural effusion identified. IMPRESSION: No demonstrated complication following aortic valve replacement. Mild bibasilar atelectasis. Electronically Signed   By: Richardean Sale M.D.   On: 03/17/2015 17:45     Assessment/Plan: S/P Procedure(s) (LRB): BIOLOGIC BENTALL PROCEDURE WITH 27 TISSUE VALVE AND 30 VALSALVA GRAFT (N/A) CLIPPING OF LEFT ATRIAL APPENDAGE (N/A) TRANSESOPHAGEAL ECHOCARDIOGRAM (TEE) (N/A) LEFT BIPOLAR MAZE (N/A)   1. CV- NSR, weaning Dopamine, and Neo as tolerated 2. Pulm- extubated early this morning. Wean oxygen as tolerated, start IS 3. Renal- creatinine is stable, mildly hypervolemic, will start Lasix once BP is better and off drips 4. Expected Post Operative Blood Loss Anemia- Hgb is stable at 9.4 5. CBGs- hypoglycemia, not on insulin drip, will not need Levemir 6. Dispo- patient stable, POD # 1 progression orders   [name] 03/18/2015 7:39 AM   Now a paced, not pacer dependent as was yesterday Patient awake and alert, neuro intact I have seen and examined [name] and agree with the above assessment  and plan.  Mark Isaac MD Beeper [phone] Office [phone] 03/18/2015 8:00 AM

## 2015-03-18 NOTE — Clinical Documentation Improvement (Signed)
Cardiothoracic  Can the diagnosis of CHF be further specified? Please update your documentation within the medical record to reflect your response to this query. Do not document in BPA drop down box. Add information to progress note.Thank you!    Acuity - Acute, Chronic, Acute on Chronic   Type - Systolic, Diastolic, Systolic and Diastolic  Other  Clinically Undetermined  Document any associated diagnoses/conditions  Supporting Information:  On ASA 324mg  daily and Lopressor 12.5 mg twice daily.  EF 20-25%  Please exercise your independent, professional judgment when responding. A specific answer is not anticipated or expected.  Thank You,  Zoila Shutter RN, [place] [phone]: Cell: [phone]

## 2015-03-18 NOTE — Progress Notes (Signed)
CT surgery p.m. Rounds  Patient had a stable day up in chair Vital signs stable, neuro intact P.m. labs reviewed-hemoglobin 7.5, by mouth iron ordered P.m. glucose greater than 200-Will start daily at bedtime Levemir 8 units

## 2015-03-18 NOTE — Progress Notes (Signed)
Utilization Review Completed.  

## 2015-03-18 NOTE — Progress Notes (Signed)
NIF -40, VC 2L

## 2015-03-19 ENCOUNTER — Inpatient Hospital Stay (HOSPITAL_COMMUNITY): Payer: Medicare Other

## 2015-03-19 DIAGNOSIS — I351 Nonrheumatic aortic (valve) insufficiency: Principal | ICD-10-CM

## 2015-03-19 DIAGNOSIS — I5022 Chronic systolic (congestive) heart failure: Secondary | ICD-10-CM

## 2015-03-19 LAB — BASIC METABOLIC PANEL
Anion gap: 6 (ref 5–15)
BUN: 22 mg/dL — ABNORMAL HIGH (ref 6–20)
CO2: 24 mmol/L (ref 22–32)
Calcium: 8.1 mg/dL — ABNORMAL LOW (ref 8.9–10.3)
Chloride: 99 mmol/L — ABNORMAL LOW (ref 101–111)
Creatinine, Ser: 0.99 mg/dL (ref 0.61–1.24)
GFR calc Af Amer: >60 mL/min (ref >60)
GFR calc non Af Amer: >60 mL/min (ref >60)
Glucose, Bld: 112 mg/dL — ABNORMAL HIGH (ref 65–99)
Potassium: 4.6 mmol/L (ref 3.5–5.1)
Sodium: 129 mmol/L — ABNORMAL LOW (ref 135–145)

## 2015-03-19 LAB — PREPARE RBC (CROSSMATCH)

## 2015-03-19 LAB — GLUCOSE, CAPILLARY
Glucose-Capillary: 119 mg/dL — ABNORMAL HIGH (ref 65–99)
Glucose-Capillary: 115 mg/dL — ABNORMAL HIGH (ref 65–99)
Glucose-Capillary: 159 mg/dL — ABNORMAL HIGH (ref 65–99)
Glucose-Capillary: 118 mg/dL — ABNORMAL HIGH (ref 65–99)
Glucose-Capillary: 130 mg/dL — ABNORMAL HIGH (ref 65–99)

## 2015-03-19 LAB — CBC
HCT: 22.7 % — ABNORMAL LOW (ref 39.0–52.0)
Hemoglobin: 7.8 g/dL — ABNORMAL LOW (ref 13.0–17.0)
MCH: 31.1 pg (ref 26.0–34.0)
MCHC: 34.4 g/dL (ref 30.0–36.0)
MCV: 90.4 fL (ref 78.0–100.0)
Platelets: 70 10*3/uL — ABNORMAL LOW (ref 150–400)
RBC: 2.51 MIL/uL — ABNORMAL LOW (ref 4.22–5.81)
RDW: 13.9 % (ref 11.5–15.5)
WBC: 10.3 10*3/uL (ref 4.0–10.5)

## 2015-03-19 MED ORDER — ENOXAPARIN SODIUM 30 MG/0.3ML ~~LOC~~ SOLN
30.0000 mg | SUBCUTANEOUS | Status: DC
  Administered 2015-03-19: 30 mg via SUBCUTANEOUS
  Filled 2015-03-19: qty 0.3

## 2015-03-19 MED ORDER — AMIODARONE HCL 200 MG PO TABS
200.0000 mg | ORAL_TABLET | Freq: Every day | ORAL | Status: DC
  Administered 2015-03-20 – 2015-03-26 (×7): 200 mg via ORAL
  Filled 2015-03-19 (×8): qty 1

## 2015-03-19 MED ORDER — FERROUS GLUCONATE 324 (38 FE) MG PO TABS
324.0000 mg | ORAL_TABLET | Freq: Two times a day (BID) | ORAL | Status: DC
  Administered 2015-03-19 – 2015-03-26 (×14): 324 mg via ORAL
  Filled 2015-03-19 (×17): qty 1

## 2015-03-19 MED ORDER — FOLIC ACID 1 MG PO TABS
1.0000 mg | ORAL_TABLET | Freq: Every day | ORAL | Status: DC
  Administered 2015-03-19 – 2015-03-26 (×8): 1 mg via ORAL
  Filled 2015-03-19 (×9): qty 1

## 2015-03-19 NOTE — Progress Notes (Addendum)
TCTS DAILY ICU PROGRESS NOTE                   [address]            [place] [address]          [phone]   2 Days Post-Op Procedure(s) (LRB): BIOLOGIC BENTALL PROCEDURE WITH 27 TISSUE VALVE AND 30 VALSALVA GRAFT (N/A) CLIPPING OF LEFT ATRIAL APPENDAGE (N/A) TRANSESOPHAGEAL ECHOCARDIOGRAM (TEE) (N/A) LEFT BIPOLAR MAZE (N/A)  Total Length of Stay:  LOS: 2 days   Subjective:  [name] states he doesn't feel as good today.  Patient has not yet ambulated  Objective: Vital signs in last 24 hours: Temp:  [97 F (36.1 C)-98 F (36.7 C)] 97.5 F (36.4 C) (11/18 0816) Pulse Rate:  [38-92] 88 (11/18 0815) Cardiac Rhythm:  [-] Atrial paced (11/18 0400) Resp:  [12-25] 18 (11/18 0815) BP: (70-103)/(48-62) 95/56 mmHg (11/18 0800) SpO2:  [90 %-100 %] 97 % (11/18 0815) Arterial Line BP: (81-124)/(37-55) 116/47 mmHg (11/18 0815) Weight:  [161 lb 13.1 oz (73.4 kg)] 161 lb 13.1 oz (73.4 kg) (11/18 0500)  Filed Weights   03/17/15 0741 03/18/15 0400 03/19/15 0500  Weight: 154 lb 11.2 oz (70.171 kg) 161 lb (73.029 kg) 161 lb 13.1 oz (73.4 kg)    Weight change: 7 lb 1.9 oz (3.228 kg)   Hemodynamic parameters for last 24 hours: PAP: (31)/(22) 31/22 mmHg  Intake/Output from previous day: 11/17 0701 - 11/18 0700 In: 2704.7 [P.O.:1080; I.V.:1274.7; IV Piggyback:350] Out: 955 [Urine:955]  Current Meds: Scheduled Meds: . acetaminophen  1,000 mg Oral 4 times per day   Or  . acetaminophen (TYLENOL) oral liquid 160 mg/5 mL  1,000 mg Per Tube 4 times per day  . antiseptic oral rinse  7 mL Mouth Rinse QID  . aspirin EC  325 mg Oral Daily   Or  . aspirin  324 mg Per Tube Daily  . atorvastatin  20 mg Oral q1800  . bisacodyl  10 mg Oral Daily   Or  . bisacodyl  10 mg Rectal Daily  . cefUROXime (ZINACEF)  IV  1.5 g Intravenous Q12H  . chlorhexidine gluconate  15 mL Mouth Rinse BID  . dextrose  28 mL Intravenous Once  . docusate sodium  200 mg Oral Daily  .  insulin aspart  0-24 Units Subcutaneous 6 times per day  . insulin aspart  0-24 Units Subcutaneous 6 times per day  . insulin detemir  8 Units Subcutaneous QHS  . metoprolol tartrate  12.5 mg Oral BID   Or  . metoprolol tartrate  12.5 mg Per Tube BID  . mometasone-formoterol  2 puff Inhalation BID  . multivitamin with minerals  1 tablet Oral Daily  . pantoprazole  40 mg Oral Daily  . sodium chloride  3 mL Intravenous Q12H  . tiotropium  18 mcg Inhalation Daily   Continuous Infusions: . sodium chloride 20 mL/hr at 03/19/15 0400  . sodium chloride    . sodium chloride 20 mL/hr at 03/18/15 0700  . dexmedetomidine Stopped (03/17/15 2210)  . DOPamine 3 mcg/kg/min (03/19/15 0400)  . lactated ringers 20 mL/hr at 03/19/15 0400  . lactated ringers 20 mL/hr at 03/18/15 0700  . phenylephrine (NEO-SYNEPHRINE) Adult infusion 10 mcg/min (03/19/15 0700)   PRN Meds:.sodium chloride, lactated ringers, metoprolol, midazolam, morphine injection, ondansetron (ZOFRAN) IV, oxyCODONE, sodium chloride, traMADol  General appearance: alert, cooperative and no distress Heart: regular rate and rhythm Lungs: clear to  auscultation bilaterally Abdomen: soft, non-tender; bowel sounds normal; no masses,  no organomegaly Extremities: edema trace Wound: clean and dry  Lab Results: CBC: Recent Labs  03/18/15 1530 03/19/15 0330  WBC 11.6* 10.3  HGB 8.1*  7.5* 7.8*  HCT 23.7*  22.0* 22.7*  PLT 73* 70*   BMET:  Recent Labs  03/18/15 0415 03/18/15 1530 03/19/15 0330  NA 132* 132* 129*  K 4.5 5.0 4.6  CL 105 98* 99*  CO2 21*  --  24  GLUCOSE 171* 217* 112*  BUN 20 21* 22*  CREATININE 1.04 1.01  0.90 0.99  CALCIUM 8.0*  --  8.1*    PT/INR:  Recent Labs  03/17/15 1730  LABPROT 24.1*  INR 2.18*   Radiology: Dg Chest Port 1 View  03/19/2015  CLINICAL DATA:  Status post Maze procedure. EXAM: PORTABLE CHEST 1 VIEW COMPARISON:  March 18, 2015. FINDINGS: Stable cardiomediastinal silhouette.  Status post cardiac valve repair. Increased bibasilar opacities are noted concerning for subsegmental atelectasis with associated pleural effusions. No definite pneumothorax is noted. Right internal jugular venous sheath is noted. Bony thorax is unremarkable. IMPRESSION: Increased bibasilar opacities are noted concerning for subsegmental atelectasis with associated pleural effusions. Electronically Signed   By: Marijo Conception, M.D.   On: 03/19/2015 07:43     Assessment/Plan: S/P Procedure(s) (LRB): BIOLOGIC BENTALL PROCEDURE WITH 27 TISSUE VALVE AND 30 VALSALVA GRAFT (N/A) CLIPPING OF LEFT ATRIAL APPENDAGE (N/A) TRANSESOPHAGEAL ECHOCARDIOGRAM (TEE) (N/A) LEFT BIPOLAR MAZE (N/A)  1. CV- paced currently-appears to be in CHB under pacer- weaning Neo and Dopamine- however per nursing having difficulty weaning drips 2. Pulm- wean oxygen as tolerated, CXR with atelectasis 3. Renal- creatinine stable at 0.99, mild hypervolemia... Will not start diuretics due to hypotension and continued need for pressors 4. Expected Post operative blood loss, Hgb down to 7.8... With difficulty weaning drips, would benefit from transfusion, will defer to Dr. Servando Snare 5. CBGs- trended up yesterday, started on Levemir last night, sugars are currently controlled 6. Dispo- patient continues to require drips, would benefit from blood transfusion will defer Dr. Servando Snare, continue current care    [name] 03/19/2015 8:23 AM  Patient has history of acute and chronic systolic and diastolic  heart failure, likely mediated by uncontrolled af in the past  see outpatient notes, prior to surgery patient has chronic systolic and diastolic heart failure treated and in sinus rhythm, preop , EF returned to 50 % on preop MRI  Patient is not in complete heart block, currently a paced with first degree heart block, paced to increase rate, rate decreased to 80 today, no a fib   Will try to avoid transfusion  Wait to remove  foley today, has history of urinary problems, d/c in am after ambulating more  Was on eliquis prop for afib, considering  resume before d/c   Weaning off neo and dopamine I have seen and examined [name] and agree with the above assessment  and plan.  Grace Isaac MD Beeper [phone] Office [phone] 03/19/2015 10:14 AM

## 2015-03-19 NOTE — Progress Notes (Signed)
Patient ID: [name], male   DOB: Sep 21, 1940, 74 y.o.   MRN: [id number]      [address]       [place] [address]             [phone]                 2 Days Post-Op Procedure(s) (LRB): BIOLOGIC BENTALL PROCEDURE WITH 27 TISSUE VALVE AND 30 VALSALVA GRAFT (N/A) CLIPPING OF LEFT ATRIAL APPENDAGE (N/A) TRANSESOPHAGEAL ECHOCARDIOGRAM (TEE) (N/A) LEFT BIPOLAR MAZE (N/A)  LOS: 2 days   Subjective: Awake and alert in chair, got weak trying to wake today  Objective: Vital signs in last 24 hours: Patient Vitals for the past 24 hrs:  BP Temp Temp src Pulse Resp SpO2 Weight  03/19/15 1554 - 98 F (36.7 C) Oral - - - -  03/19/15 1545 - - - 93 20 93 % -  03/19/15 1530 - - - 80 15 99 % -  03/19/15 1515 - - - 79 17 98 % -  03/19/15 1500 (!) 95/53 mmHg - - 80 16 98 % -  03/19/15 1445 - - - 80 19 98 % -  03/19/15 1430 - - - 80 17 99 % -  03/19/15 1415 - - - 80 17 99 % -  03/19/15 1400 (!) 89/51 mmHg - - 80 17 99 % -  03/19/15 1345 (!) 90/52 mmHg - - 80 17 97 % -  03/19/15 1330 (!) 85/51 mmHg - - 80 16 95 % -  03/19/15 1315 (!) 84/52 mmHg - - 80 20 95 % -  03/19/15 1304 (!) 92/55 mmHg - - - - - -  03/19/15 1300 - - - 71 (!) 27 96 % -  03/19/15 1245 - - - 80 16 96 % -  03/19/15 1230 - - - 78 18 96 % -  03/19/15 1215 - - - 80 18 94 % -  03/19/15 1200 (!) 89/52 mmHg - - 79 18 95 % -  03/19/15 1145 - - - 80 (!) 22 98 % -  03/19/15 1138 - 98.2 F (36.8 C) Oral - - - -  03/19/15 1130 - - - 80 19 98 % -  03/19/15 1115 - - - 80 16 99 % -  03/19/15 1100 (!) 90/53 mmHg - - 80 19 100 % -  03/19/15 1045 - - - 82 20 99 % -  03/19/15 1030 - - - 80 (!) 22 99 % -  03/19/15 1015 - - - 74 (!) 22 100 % -  03/19/15 1000 (!) 97/55 mmHg - - 80 11 99 % -  03/19/15 0945 - - - 88 (!) 22 98 % -  03/19/15 0930 - - - 88 17 98 % -  03/19/15 0915 - - - 88 16 96 % -  03/19/15 0900 100/60 mmHg - - 88 16 97 % -  03/19/15 0845 - - - 88 16 96 % -  03/19/15 0830 - - - 88 17 96 % -    03/19/15 0816 - 97.5 F (36.4 C) Oral - - - -  03/19/15 0815 - - - 88 18 97 % -  03/19/15 0800 (!) 95/56 mmHg - - 88 17 93 % -  03/19/15 0747 - - - - - 97 % -  03/19/15 0745 - - - 88 16 97 % -  03/19/15 0730 - - - 88 18 96 % -  03/19/15  0715 - - - 88 17 95 % -  03/19/15 0700 (!) 97/58 mmHg - - 88 17 95 % -  03/19/15 0600 (!) 98/58 mmHg - - 88 17 97 % -  03/19/15 0500 (!) 103/58 mmHg - - 88 15 96 % 161 lb 13.1 oz (73.4 kg)  03/19/15 0412 - 98 F (36.7 C) Oral - - - -  03/19/15 0400 (!) 98/58 mmHg - - 88 15 97 % -  03/19/15 0300 (!) 102/58 mmHg - - 88 16 95 % -  03/19/15 0200 (!) 102/59 mmHg - - 88 16 96 % -  03/19/15 0100 (!) 94/56 mmHg - - 88 14 93 % -  03/19/15 0004 - 97.7 F (36.5 C) Oral - - - -  03/19/15 0000 (!) 100/59 mmHg - - 88 15 97 % -  03/18/15 2300 (!) 98/54 mmHg - - 88 15 97 % -  03/18/15 2200 (!) 99/56 mmHg - - 87 18 99 % -  03/18/15 2100 (!) 97/57 mmHg - - 88 16 95 % -  03/18/15 2000 (!) 90/52 mmHg - - 88 16 96 % -  03/18/15 1945 - 97.4 F (36.3 C) Oral - - - -  03/18/15 1921 - - - - - 98 % -  03/18/15 1900 (!) 85/50 mmHg - - 88 17 98 % -  03/18/15 1845 - - - 87 18 100 % -  03/18/15 1830 - - - 88 18 100 % -  03/18/15 1815 - - - 90 (!) 25 100 % -  03/18/15 1800 95/61 mmHg - - 88 14 99 % -  03/18/15 1745 - - - 89 20 96 % -  03/18/15 1730 - - - 89 (!) 24 100 % -  03/18/15 1715 - - - 89 (!) 22 98 % -  03/18/15 1700 101/62 mmHg - - (!) 38 16 90 % -  03/18/15 1645 - - - 86 19 100 % -  03/18/15 1630 - 97.5 F (36.4 C) Oral 88 13 96 % -  03/18/15 1615 - - - 88 15 96 % -  03/18/15 1600 (!) 90/53 mmHg - - 88 14 96 % -    Filed Weights   03/17/15 0741 03/18/15 0400 03/19/15 0500  Weight: 154 lb 11.2 oz (70.171 kg) 161 lb (73.029 kg) 161 lb 13.1 oz (73.4 kg)    Hemodynamic parameters for last 24 hours:    Intake/Output from previous day: 11/17 0701 - 11/18 0700 In: 2704.7 [P.O.:1080; I.V.:1274.7; IV Piggyback:350] Out: 955 [Urine:955] Intake/Output this  shift: Total I/O In: 733.7 [P.O.:360; I.V.:373.7] Out: 460 [Urine:460]  Scheduled Meds: . acetaminophen  1,000 mg Oral 4 times per day   Or  . acetaminophen (TYLENOL) oral liquid 160 mg/5 mL  1,000 mg Per Tube 4 times per day  . [START ON 03/20/2015] amiodarone  200 mg Oral Daily  . antiseptic oral rinse  7 mL Mouth Rinse QID  . aspirin EC  325 mg Oral Daily   Or  . aspirin  324 mg Per Tube Daily  . atorvastatin  20 mg Oral q1800  . bisacodyl  10 mg Oral Daily   Or  . bisacodyl  10 mg Rectal Daily  . cefUROXime (ZINACEF)  IV  1.5 g Intravenous Q12H  . chlorhexidine gluconate  15 mL Mouth Rinse BID  . dextrose  28 mL Intravenous Once  . docusate sodium  200 mg Oral Daily  . enoxaparin (LOVENOX) injection  30 mg  Subcutaneous Q24H  . ferrous gluconate  324 mg Oral BID WC  . folic acid  1 mg Oral Daily  . insulin aspart  0-24 Units Subcutaneous 6 times per day  . insulin detemir  8 Units Subcutaneous QHS  . mometasone-formoterol  2 puff Inhalation BID  . multivitamin with minerals  1 tablet Oral Daily  . pantoprazole  40 mg Oral Daily  . sodium chloride  3 mL Intravenous Q12H  . tiotropium  18 mcg Inhalation Daily   Continuous Infusions: . sodium chloride 20 mL/hr at 03/19/15 0800  . sodium chloride    . sodium chloride 20 mL/hr at 03/18/15 0700  . dexmedetomidine Stopped (03/17/15 2210)  . DOPamine 3 mcg/kg/min (03/19/15 0800)  . lactated ringers 20 mL/hr at 03/19/15 0800  . lactated ringers 20 mL/hr at 03/18/15 0700  . phenylephrine (NEO-SYNEPHRINE) Adult infusion Stopped (03/19/15 1049)   PRN Meds:.sodium chloride, lactated ringers, metoprolol, midazolam, ondansetron (ZOFRAN) IV, oxyCODONE, sodium chloride, traMADol   Lab Results: CBC: Recent Labs  03/18/15 1530 03/19/15 0330  WBC 11.6* 10.3  HGB 8.1*  7.5* 7.8*  HCT 23.7*  22.0* 22.7*  PLT 73* 70*   BMET:  Recent Labs  03/18/15 0415 03/18/15 1530 03/19/15 0330  NA 132* 132* 129*  K 4.5 5.0 4.6  CL  105 98* 99*  CO2 21*  --  24  GLUCOSE 171* 217* 112*  BUN 20 21* 22*  CREATININE 1.04 1.01  0.90 0.99  CALCIUM 8.0*  --  8.1*    PT/INR:  Recent Labs  03/17/15 1730  LABPROT 24.1*  INR 2.18*     Radiology Dg Chest Port 1 View  03/19/2015  CLINICAL DATA:  Status post Maze procedure. EXAM: PORTABLE CHEST 1 VIEW COMPARISON:  March 18, 2015. FINDINGS: Stable cardiomediastinal silhouette. Status post cardiac valve repair. Increased bibasilar opacities are noted concerning for subsegmental atelectasis with associated pleural effusions. No definite pneumothorax is noted. Right internal jugular venous sheath is noted. Bony thorax is unremarkable. IMPRESSION: Increased bibasilar opacities are noted concerning for subsegmental atelectasis with associated pleural effusions. Electronically Signed   By: Marijo Conception, M.D.   On: 03/19/2015 07:43   Dg Chest Port 1 View  03/18/2015  CLINICAL DATA:  Status post than tall procedure and Maze procedure on March 17, 2015 EXAM: PORTABLE CHEST 1 VIEW COMPARISON:  Portable chest x-ray of March 17, 2015 FINDINGS: The lungs are well-expanded. Interval extubation of the trachea. The mediastinal drain remains. No definite pneumothorax is observed. There is no pleural effusion. Bibasilar atelectasis has improved. The heart and pulmonary vascularity are normal. The Swan-Ganz catheter tip projects over the proximal main pulmonary artery trunk. The left atrial appendage clip is visible. A prosthetic aortic valve ring is visible. There are 7 intact sternal wires. IMPRESSION: Improved basilar subsegmental atelectasis since extubation. There is no evidence of pneumonia nor pulmonary edema. No pneumothorax is demonstrated. Electronically Signed   By: David  Martinique M.D.   On: 03/18/2015 07:37   Dg Chest Port 1 View  03/17/2015  CLINICAL DATA:  Postop aortic valve replacement.  Initial encounter. EXAM: PORTABLE CHEST 1 VIEW COMPARISON:  03/15/2015 radiographs.  FINDINGS: 1724 hours. Endotracheal tube tip is in the midtrachea. Right IJ Swan-Ganz catheter tip is in the main pulmonary artery. There is a probable mediastinal drain or medial right-sided chest tube. The heart size is stable status post median sternotomy and aortic valve replacement. There is mild atelectasis at both lung bases. No pneumothorax or significant  pleural effusion identified. IMPRESSION: No demonstrated complication following aortic valve replacement. Mild bibasilar atelectasis. Electronically Signed   By: Richardean Sale M.D.   On: 03/17/2015 17:45     Assessment/Plan: S/P Procedure(s) (LRB): BIOLOGIC BENTALL PROCEDURE WITH 27 TISSUE VALVE AND 30 VALSALVA GRAFT (N/A) CLIPPING OF LEFT ATRIAL APPENDAGE (N/A) TRANSESOPHAGEAL ECHOCARDIOGRAM (TEE) (N/A) LEFT BIPOLAR MAZE (N/A) Neo off, still on dopamine, will give one unit of PRBC and try  to wean off  dopamine  A paced for rate, underlying 1st degree block 70 Resume Cordarone in am Pt consult  Grace Isaac MD 03/19/2015 3:59 PM

## 2015-03-19 NOTE — Progress Notes (Signed)
Pt ambulated 10 ft and became dizzy and had to be lifted to chair by four assists.

## 2015-03-19 NOTE — Progress Notes (Signed)
Patient ID: [name], male   DOB: Sep 14, 1940, 74 y.o.   MRN: [id number]  SICU Evening Rounds:  He has been hemodynamically stable today but BP borderline low and he was dizzy and almost fell out while ambulating. hgb was 7.8 this am so he is being transfused.  Urine output ok  He feels well overall.

## 2015-03-19 NOTE — Op Note (Signed)
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  SURGICAL PROCEDURE:  Biologic Bentall with aortic root replacement and reimplantation of coronary arteries, replacement of aortic valve and root with pericardial tissue valve [employer] model 3300TFX 27 mm serial [id number] and 30 mm Valsalva graft and pulmonary vein MAZE procedure with left-sided pulmonary isolation lesions, placement of 35 mm atrial clip.  SURGEON:  Mark Bal, MD  FIRST ASSISTANT:  Lars Pinks, [place]  BRIEF HISTORY:  The patient is a 74 year old male, who presented in the spring of 2016, with severe heart failure.  At that time, he was found to be in poorly controlled atrial fib, unknown duration of time.  He was admitted with congestive heart failure, diuresed, evaluated with cardiac catheterization and TEE.  He was found to have a 15-20% ejection fraction on TEE, mild-to-moderate aortic insufficiency after cardiac catheterization revealed no significant coronary artery disease.  The patient was seen at that time, and it was felt that aggressive treatment for his heart failure especially with the degree of aortic insufficiency milder than what would be expected for the degree of LV dysfunction. The hypothesis was that his atrial fibrillation was contributing to his underlying LV dysfunction.  With a period of time with aggressive treatment by Dr. Aundra Dubin in the  [facility], the patient's overall physical status improved and aortic root replacement was recommended to the patient.  Initially, he was reluctant to proceed, but finally agreed.  A followup cardiac MRI showed that his LV function had returned to in the 50% range.  Risks and options of surgery were discussed with the patient preoperatively.  He was made aware of the CDC warning concerning heater cooler units and signed informed consent.  DESCRIPTION OF PROCEDURE:  With Swan-Ganz and arterial line monitors in place, the patient underwent general endotracheal anesthesia without incident.  Skin of the chest and legs was prepped with Betadine and draped in usual sterile manner.  TEE probe was placed and findings are dictated by Anesthesia under separate note.  The patient did have mild- to-moderate aortic insufficiency, a very dilated aortic root, almost 6 cm.  Overall, ventricular function was in the 50% range, 50% ejection fraction.  The patient was in sinus rhythm.  After appropriate time-out, the skin of the chest and legs were prepped with Betadine and draped in sterile manner and median sternotomy was performed.  Pericardium was opened.  Findings at this time were as indicated by the MRI with a significant dilatation of the aortic root and then above the midportion of the ascending aorta, had returned to fairly normal size just it is approximately 3 cm.  This gave sufficient space for aortic cannulation and placement  of aortic cross-clamp.  The patient's left carotid did come off very close to the origin of the innominate artery.  The patient was systemically heparinized.  The ascending aorta was cannulated. Metal-tipped right-angle cannulas were placed in the superior and inferior vena cava for dual venous cannulation to allow better access to the aortic root.  A retrograde cardioplegic catheter was placed.  The patient was placed on cardiopulmonary bypass 2.2 L/min/m2.  A  right superior pulmonary vein vent was placed.  We then encircled the pulmonary veins with an elastic tape to facilitate RF ablation.  The left atrial appendage was measured for approximately 35 mm AccuCure atrial clip.  At that time of the TEE, no clots and atrial appendage were appreciated.  We then cooled the patient to 32 degrees.  Aortic crossclamp was applied.  Aortic root vent cardioplegia needle was introduced into the ascending aorta.  Initially, antegrade cardioplegia was administered until the heart fibrillated and then additional retrograde cardioplegia was administered with diastolic arrest of the heart, myocardial septal temperature was monitored throughout the cross- clamp.  Initially, we proceeded with MAZE procedure with the lesion set consisting of RF ablation with AccuCure device across the base of the atrial appendage at the takeoff of left and right pulmonary veins. Three lesions at each site were performed.  We then proceeded with placement of the 35 mm AccuCure clip.  We then proceeded with resection of the aortic root.  The proximal aorta was divided, this gave good visualization of both the left and right coronary cusp and coronary ostium.  The aortic root as noted with markedly dilated approximately 6 cm.  We did decide to proceed with biologic Bentall.  Each of the right and left coronary buttons were created and dissected free to allow easy movement.  The left main coronary artery was extremely short and then on the dissection, the takeoff of the LAD was easily identified taking care not to twist or kink.  The coronaries were then proceeded with dissection of the remainder of the root removed 8 leaflets, sized the valve for a 27 pericardial tissue valve.  A 30 mm Valsalva graft was selected, #2 Ti-Cron pledgeted sutures were placed circumferentially with the pledgets on the outer side through the aortic anulus.  The sutures were then placed through the aortic  valve and the Valsalva graft and the graft was secured in place.  CoSeal was placed around the edges. We next proceeded with placement of the aortic left coronary button. The Valsalva graft was opened at appropriate site and using a running 5- 0 Prolene, the left coronary button was sewn to the graft.  Through the graft additional cold blood cardioplegia was administered antegrade.  A small amount of CoSeal was placed around the button.  We then proceeded with attachment of the right coronary button, in a similar fashion, a running 5-0 Prolene was used to anastomose the button.  The distal anastomosis was then performed with a running 3-0 Prolene, felt strips distally.  Before completion, the heart was allowed to passively fill and de-air and aortic root vent was placed in the graft to further de- air the heart.  The aortic crossclamp was then removed with a total crossclamp time of 186 minutes.  The heart was further de-aired with a 16-gauge needle in the left ventricular apex.  Body temperature was rewarmed to 37 degrees.  The patient initially was in complete heart block and required atrial and ventricular pacing wires to be applied. As we  filled the heart, the valve appeared to be functioning well. There was no mitral regurgitation.  The right superior pulmonary vein vent was removed.  The retrograde cardioplegic catheter was removed and the patient was weaned from cardiopulmonary bypass without difficulty. He remained hemodynamically stable on low-dose dopamine.  He did require 2 additional pledgeted sutures on the backside of the distal suture line for hemostasis, but otherwise was without significant bleeding. Protamine sulfate was administered with the operative field hemostatic, 2 Blake mediastinal drains were left in place.  The pericardium was loosely reapproximated.  The sternum was closed with #6 stainless steel wires.  Fascia was closed with interrupted 0 Vicryl and 3-0  Vicryl in subcutaneous tissue, 4-0 subcuticular stitch in skin edges.  Dry dressings were applied.  Sponge and needle count was reported as correct at completion of the procedure.  The patient did require 1 unit of packed red blood cells for expected blood loss anemia and preoperative anemia.  Sponge and needle count was reported as correct at completion of procedure.  The patient tolerated the procedure without obvious complication, was transferred to the Surgical Intensive care Unit for further postoperative care.     Mark Bal, MD     EG/MEDQ  D:  03/19/2015  T:  03/19/2015  Job:  [id number]

## 2015-03-19 NOTE — Consult Note (Signed)
Advanced Heart Failure Team Consult Note  Referring Physician: Dr Servando Snare Primary Physician: Primary Cardiologist:  Dr Aundra Dubin   Reason for Consultation: Heart Failure   HPI:    [name] is a 74 year old with a history of HTN and COPD was admitted in 3/16 with atrial fibrillation/RVR and dyspnea. Echo showed low EF (15-20% range). He had TEE-guided DCCV to NSR and cardiac catheterization. Cath showed no obstructive coronary disease (nonischemic cardiomyopathy). Echo showed dilated aortic root. 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  Admitted for scheduled BENTALL and clipping of L atrial appendage and MAZE procedure on November 16th.  Weaned all drips except dopamine at 3 mcg. SBP 90s.   Complaining of pain but refuses pain medications.  .    Review of Systems: [y] = yes, [ ]  = no   General: Weight gain [ ] ; Weight loss [ ] ; Anorexia [ ] ; Fatigue [ ] ; Fever [ ] ; Chills [ ] ; Weakness [Y ]  Cardiac: Chest pain/pressure [ ] ; Resting SOB [ ] ; Exertional SOB [Y ]; Orthopnea [ ] ; Pedal Edema [ ] ; Palpitations [ ] ; Syncope [ ] ; Presyncope [ ] ; Paroxysmal nocturnal dyspnea[ ]   Pulmonary: Cough [ ] ; Wheezing[ ] ; Hemoptysis[ ] ; Sputum [ ] ; Snoring [ ]   GI: Vomiting[ ] ; Dysphagia[ ] ; Melena[ ] ; Hematochezia [ ] ; Heartburn[ ] ; Abdominal pain [ ] ; Constipation [ ] ; Diarrhea [ ] ; BRBPR [ ]   GU: Hematuria[ ] ; Dysuria [ ] ; Nocturia[ ]   Vascular: Pain in legs with walking [ ] ; Pain in feet with lying flat [ ] ; Non-healing sores [ ] ; Stroke [ ] ; TIA [ ] ; Slurred speech [ ] ;  Neuro: Headaches[ ] ; Vertigo[ ] ; Seizures[ ] ; Paresthesias[ ] ;Blurred vision [ ] ; Diplopia [ ] ; Vision changes [ ]   Ortho/Skin: Arthritis [ ] ; Joint pain [ Y]; Muscle pain [ ] ; Joint swelling [ ] ; Back Pain [ ] ; Rash [ ]   Psych: Depression[ ] ; Anxiety[ ]   Heme: Bleeding problems [ ] ; Clotting disorders [ ] ; Anemia [ ]   Endocrine: Diabetes [ ] ; Thyroid  dysfunction[ ]   Home Medications Prior to Admission medications   Medication Sig Start Date End Date Taking? Authorizing Provider  ADVAIR DISKUS 250-50 MCG/DOSE AEPB Inhale 1 puff into the lungs 2 (two) times daily.  06/16/14  Yes Historical Provider, MD  ALPRAZolam Duanne Moron) 0.25 MG tablet Take 0.25 mg by mouth 2 (two) times daily as needed for anxiety or sleep.  08/10/14  Yes Historical Provider, MD  amiodarone (PACERONE) 200 MG tablet Take 1 tablet (200 mg total) by mouth daily. 07/15/14  Yes Charolette Forward, MD  apixaban (ELIQUIS) 5 MG TABS tablet Take 5 mg by mouth 2 (two) times daily.   Yes Historical Provider, MD  atorvastatin (LIPITOR) 20 MG tablet Take 20 mg by mouth daily at 6 PM.  06/16/14  Yes Historical Provider, MD  carvedilol (COREG) 6.25 MG tablet Take 1 tablet (6.25 mg total) by mouth 2 (two) times daily with a meal. 01/28/15  Yes Larey Dresser, MD  lisinopril (PRINIVIL,ZESTRIL) 5 MG tablet TAKE 1 TABLET BY MOUTH TWICE A DAY Patient taking differently: TAKE 5 MG BY MOUTH TWICE A DAY 01/07/15  Yes Larey Dresser, MD  Multiple Vitamin (MULTIVITAMIN) tablet Take 1 tablet by mouth daily.   Yes Historical Provider, MD  PROAIR HFA 108 (90 BASE) MCG/ACT inhaler Inhale 1 puff into the lungs every 4 (four) hours as needed for wheezing or  shortness of breath.  07/07/14  Yes Historical Provider, MD  SPIRIVA HANDIHALER 18 MCG inhalation capsule Place 18 mcg into inhaler and inhale daily.  06/16/14  Yes Historical Provider, MD  spironolactone (ALDACTONE) 25 MG tablet Take 0.5 tablets (12.5 mg total) by mouth daily. 01/28/15  Yes Larey Dresser, MD    Past Medical History: Past Medical History  Diagnosis Date  . A-fib ([place])   . Hypertension   . Asthma   . CHF (congestive heart failure) ([place])   . Seasonal allergies   . Cataracts, bilateral   . History of bronchitis   . Anxiety     Xanax PRN  . Arthritis   . History of stomach ulcers     Past Surgical History: Past Surgical History   Procedure Laterality Date  . Tee without cardioversion N/A 07/13/2014    Procedure: TRANSESOPHAGEAL ECHOCARDIOGRAM (TEE) WITH CARDIOVERSION;  Surgeon: Dixie Dials, MD;  Location: [place];  Service: Cardiovascular;  Laterality: N/A;  . Cardioversion N/A 07/13/2014    Procedure: CARDIOVERSION;  Surgeon: Dixie Dials, MD;  Location: [facility];  Service: Cardiovascular;  Laterality: N/A;  . Left and right heart catheterization with coronary angiogram N/A 07/14/2014    Procedure: LEFT AND RIGHT HEART CATHETERIZATION WITH CORONARY ANGIOGRAM;  Surgeon: Charolette Forward, MD;  Location: [facility] CATH LAB;  Service: Cardiovascular;  Laterality: N/A;  . Cardioversion N/A 10/01/2014    Procedure: CARDIOVERSION;  Surgeon: Larey Dresser, MD;  Location: [place];  Service: Cardiovascular;  Laterality: N/A;  . Cardiac catheterization    . Hernia repair    . Joint replacement      bilateral knee  . Cholecystectomy    . Colonoscopy    . Esophagogastroduodenoscopy    . Bentall procedure N/A 03/17/2015    Procedure: BIOLOGIC BENTALL PROCEDURE WITH 27 TISSUE VALVE AND 30 VALSALVA GRAFT;  Surgeon: Grace Isaac, MD;  Location: [place];  Service: Open Heart Surgery;  Laterality: N/A;  . Clipping of atrial appendage N/A 03/17/2015    Procedure: CLIPPING OF LEFT ATRIAL APPENDAGE;  Surgeon: Grace Isaac, MD;  Location: [place];  Service: Open Heart Surgery;  Laterality: N/A;  . Tee without cardioversion N/A 03/17/2015    Procedure: TRANSESOPHAGEAL ECHOCARDIOGRAM (TEE);  Surgeon: Grace Isaac, MD;  Location: [place];  Service: Open Heart Surgery;  Laterality: N/A;  . Maze N/A 03/17/2015    Procedure: LEFT BIPOLAR MAZE;  Surgeon: Grace Isaac, MD;  Location: [place];  Service: Open Heart Surgery;  Laterality: N/A;    Family History: History reviewed. No pertinent family history.  Social History: Social History   Social History  . Marital Status: Married    Spouse Name: N/A  . Number of Children:  N/A  . Years of Education: N/A   Social History Main Topics  . Smoking status: Passive Smoke Exposure - Never Smoker  . Smokeless tobacco: Never Used  . Alcohol Use: No  . Drug Use: No  . Sexual Activity: Not Asked   Other Topics Concern  . None   Social History Narrative    Allergies:  No Known Allergies  Objective:    Vital Signs:   Temp:  [97.3 F (36.3 C)-98 F (36.7 C)] 97.5 F (36.4 C) (11/18 0816) Pulse Rate:  [38-92] 74 (11/18 1015) Resp:  [11-25] 22 (11/18 1015) BP: (70-103)/(48-62) 97/55 mmHg (11/18 1000) SpO2:  [90 %-100 %] 100 % (11/18 1015) Arterial Line BP: (81-124)/(36-57) 87/36 mmHg (11/18 1015) Weight:  [161 lb  13.1 oz (73.4 kg)] 161 lb 13.1 oz (73.4 kg) (11/18 0500)    Weight change: Filed Weights   03/17/15 0741 03/18/15 0400 03/19/15 0500  Weight: 154 lb 11.2 oz (70.171 kg) 161 lb (73.029 kg) 161 lb 13.1 oz (73.4 kg)    Intake/Output:   Intake/Output Summary (Last 24 hours) at 03/19/15 1031 Last data filed at 03/19/15 0900  Gross per 24 hour  Intake 2288.2 ml  Output    910 ml  Net 1378.2 ml     Physical Exam: General:  Well appearing. No resp difficulty. Sitting in the chair.  HEENT: normal Neck: supple. JVP . Carotids 2+ bilat; no bruits. No lymphadenopathy or thryomegaly appreciated. Cor: PMI nondisplaced. Regular rate & rhythm. No rubs, gallops or murmurs. Sternal dressing intact.  Lungs: clear Abdomen: soft, nontender, nondistended. No hepatosplenomegaly. No bruits or masses. Good bowel sounds. Extremities: no cyanosis, clubbing, rash, edema Neuro: alert & orientedx3, cranial nerves grossly intact. moves all 4 extremities w/o difficulty. Affect pleasant GU: Foley  Telemetry: Paced 80  Labs: Basic Metabolic Panel:  Recent Labs Lab 03/15/15 1341  03/17/15 1548  03/17/15 1729 03/17/15 2315 03/17/15 2321 03/18/15 0415 03/18/15 1530 03/19/15 0330  NA 132*  < > 135  < > 134*  --  132* 132* 132* 129*  K 4.3  < > 4.1  < >  4.0  --  4.7 4.5 5.0 4.6  CL 100*  < > 100*  --   --   --  101 105 98* 99*  CO2 26  --   --   --   --   --   --  21*  --  24  GLUCOSE 99  < > 160*  --  62*  --  166* 171* 217* 112*  BUN 27*  < > 23*  --   --   --  22* 20 21* 22*  CREATININE 1.19  < > 0.90  --   --  1.06 0.90 1.04 1.01  0.90 0.99  CALCIUM 9.3  --   --   --   --   --   --  8.0*  --  8.1*  MG  --   --   --   --   --  2.8*  --  2.5* 2.3  --   < > = values in this interval not displayed.  Liver Function Tests:  Recent Labs Lab 03/15/15 1341  AST 25  ALT 26  ALKPHOS 60  BILITOT 0.6  PROT 6.4*  ALBUMIN 3.9   No results for input(s): LIPASE, AMYLASE in the last 168 hours. No results for input(s): AMMONIA in the last 168 hours.  CBC:  Recent Labs Lab 03/17/15 1730 03/17/15 2315 03/17/15 2321 03/18/15 0415 03/18/15 1530 03/19/15 0330  WBC 8.3 8.4  --  10.9* 11.6* 10.3  HGB 10.9* 9.5* 8.5* 9.4* 8.1*  7.5* 7.8*  HCT 30.9* 26.7* 25.0* 26.7* 23.7*  22.0* 22.7*  MCV 88.8 88.4  --  88.7 89.1 90.4  PLT 90* 78*  --  79* 73* 70*    Cardiac Enzymes: No results for input(s): CKTOTAL, CKMB, CKMBINDEX, TROPONINI in the last 168 hours.  BNP: BNP (last 3 results)  Recent Labs  07/10/14 0150 07/10/14 0925  BNP 619.0* 758.2*    ProBNP (last 3 results) No results for input(s): PROBNP in the last 8760 hours.   CBG:  Recent Labs Lab 03/18/15 1645 03/18/15 1939 03/18/15 2354 03/19/15 0359 03/19/15 0815  GLUCAP 183*  137* 163* 119* 115*    Coagulation Studies:  Recent Labs  03/17/15 1730  LABPROT 24.1*  INR 2.18*    Other results: EKG: Imaging: Dg Chest Port 1 View  03/19/2015  CLINICAL DATA:  Status post Maze procedure. EXAM: PORTABLE CHEST 1 VIEW COMPARISON:  March 18, 2015. FINDINGS: Stable cardiomediastinal silhouette. Status post cardiac valve repair. Increased bibasilar opacities are noted concerning for subsegmental atelectasis with associated pleural effusions. No definite  pneumothorax is noted. Right internal jugular venous sheath is noted. Bony thorax is unremarkable. IMPRESSION: Increased bibasilar opacities are noted concerning for subsegmental atelectasis with associated pleural effusions. Electronically Signed   By: Marijo Conception, M.D.   On: 03/19/2015 07:43   Dg Chest Port 1 View  03/18/2015  CLINICAL DATA:  Status post than tall procedure and Maze procedure on March 17, 2015 EXAM: PORTABLE CHEST 1 VIEW COMPARISON:  Portable chest x-ray of March 17, 2015 FINDINGS: The lungs are well-expanded. Interval extubation of the trachea. The mediastinal drain remains. No definite pneumothorax is observed. There is no pleural effusion. Bibasilar atelectasis has improved. The heart and pulmonary vascularity are normal. The Swan-Ganz catheter tip projects over the proximal main pulmonary artery trunk. The left atrial appendage clip is visible. A prosthetic aortic valve ring is visible. There are 7 intact sternal wires. IMPRESSION: Improved basilar subsegmental atelectasis since extubation. There is no evidence of pneumonia nor pulmonary edema. No pneumothorax is demonstrated. Electronically Signed   By: David  Martinique M.D.   On: 03/18/2015 07:37   Dg Chest Port 1 View  03/17/2015  CLINICAL DATA:  Postop aortic valve replacement.  Initial encounter. EXAM: PORTABLE CHEST 1 VIEW COMPARISON:  03/15/2015 radiographs. FINDINGS: 1724 hours. Endotracheal tube tip is in the midtrachea. Right IJ Swan-Ganz catheter tip is in the main pulmonary artery. There is a probable mediastinal drain or medial right-sided chest tube. The heart size is stable status post median sternotomy and aortic valve replacement. There is mild atelectasis at both lung bases. No pneumothorax or significant pleural effusion identified. IMPRESSION: No demonstrated complication following aortic valve replacement. Mild bibasilar atelectasis. Electronically Signed   By: Richardean Sale M.D.   On: 03/17/2015 17:45       Medications:     Current Medications: . acetaminophen  1,000 mg Oral 4 times per day   Or  . acetaminophen (TYLENOL) oral liquid 160 mg/5 mL  1,000 mg Per Tube 4 times per day  . antiseptic oral rinse  7 mL Mouth Rinse QID  . aspirin EC  325 mg Oral Daily   Or  . aspirin  324 mg Per Tube Daily  . atorvastatin  20 mg Oral q1800  . bisacodyl  10 mg Oral Daily   Or  . bisacodyl  10 mg Rectal Daily  . cefUROXime (ZINACEF)  IV  1.5 g Intravenous Q12H  . chlorhexidine gluconate  15 mL Mouth Rinse BID  . dextrose  28 mL Intravenous Once  . docusate sodium  200 mg Oral Daily  . enoxaparin (LOVENOX) injection  30 mg Subcutaneous Q24H  . ferrous gluconate  324 mg Oral BID WC  . folic acid  1 mg Oral Daily  . insulin aspart  0-24 Units Subcutaneous 6 times per day  . insulin aspart  0-24 Units Subcutaneous 6 times per day  . insulin detemir  8 Units Subcutaneous QHS  . metoprolol tartrate  12.5 mg Oral BID   Or  . metoprolol tartrate  12.5 mg Per Tube BID  .  mometasone-formoterol  2 puff Inhalation BID  . multivitamin with minerals  1 tablet Oral Daily  . pantoprazole  40 mg Oral Daily  . sodium chloride  3 mL Intravenous Q12H  . tiotropium  18 mcg Inhalation Daily     Infusions: . sodium chloride 20 mL/hr at 03/19/15 0800  . sodium chloride    . sodium chloride 20 mL/hr at 03/18/15 0700  . dexmedetomidine Stopped (03/17/15 2210)  . DOPamine 3 mcg/kg/min (03/19/15 0800)  . lactated ringers 20 mL/hr at 03/19/15 0800  . lactated ringers 20 mL/hr at 03/18/15 0700  . phenylephrine (NEO-SYNEPHRINE) Adult infusion 5 mcg/min (03/19/15 [id number])      Assessment/Plan   Admitted for aortic root replacrment.   1. POD # 2 BENTALL with bioprosthetic aortic valve, clipping L atrial appendage. Paced. Doing well.  Neo wean off this morning. Remains on dopamine 3 mcg. SBP in 90s.  Mobilize today.  2. Anemia: Expected post operative blood loss. Hgb 7.8. Follow CBC. Transfusions per  Dr Servando Snare.  3. Chronic Systolic HF NICM. CMRI EF 51% (9/16, improved). Dilated aortic root, mildly dilated RV.  - No bb for now.  - Volume status ok. No diuretic for now.   - Renal function stable.  4.  PAF: Prior to admit was on amio 200 mg daily + eliquis 5 mg twice daily. Would resume amiodarone tomorrow. Paced. No eliquis for now. Per CT surgery.  5. COPD  6. Hyperlipidemia: on statin  7. Hyponatremia:  Todays sodium 129. Follow BMET   Length of Stay: 2  Amy Clegg NP-C  03/19/2015, 10:31 AM  Advanced Heart Failure Team Pager [phone] (M-F; [place])  Please contact [facility] for night-coverage after hours (4p -7a ) and weekends on [email or web address]  Patient seen with NP, agree with the above note.  1. Dilated aortic root/moderate AI: s/p Bentall with bioprosthetic aortic valve.  Will need post-op echo eventually.  2. Paroxysmal atrial fibrillation: Currently a-paced (has pacing wires in).  Restart amiodarone tomorrow.  Eliquis resumption when deemed appropriate by surgery.  3. Chronic systolic CHF: cMRI in [id number] showed improvement in EF to 51%. Volume status looks ok.  - Weaned off phenylephrine today, will begin weaning dopamine.    [name] 03/19/2015 12:50 PM

## 2015-03-20 ENCOUNTER — Inpatient Hospital Stay (HOSPITAL_COMMUNITY): Payer: Medicare Other

## 2015-03-20 LAB — CBC
HCT: 23.6 % — ABNORMAL LOW (ref 39.0–52.0)
Hemoglobin: 8 g/dL — ABNORMAL LOW (ref 13.0–17.0)
MCH: 30.4 pg (ref 26.0–34.0)
MCHC: 33.9 g/dL (ref 30.0–36.0)
MCV: 89.7 fL (ref 78.0–100.0)
Platelets: 67 10*3/uL — ABNORMAL LOW (ref 150–400)
RBC: 2.63 MIL/uL — ABNORMAL LOW (ref 4.22–5.81)
RDW: 15 % (ref 11.5–15.5)
WBC: 10.6 10*3/uL — ABNORMAL HIGH (ref 4.0–10.5)

## 2015-03-20 LAB — GLUCOSE, CAPILLARY
Glucose-Capillary: 112 mg/dL — ABNORMAL HIGH (ref 65–99)
Glucose-Capillary: 128 mg/dL — ABNORMAL HIGH (ref 65–99)
Glucose-Capillary: 140 mg/dL — ABNORMAL HIGH (ref 65–99)
Glucose-Capillary: 156 mg/dL — ABNORMAL HIGH (ref 65–99)
Glucose-Capillary: 81 mg/dL (ref 65–99)
Glucose-Capillary: 86 mg/dL (ref 65–99)

## 2015-03-20 LAB — BASIC METABOLIC PANEL
Anion gap: 4 — ABNORMAL LOW (ref 5–15)
BUN: 25 mg/dL — ABNORMAL HIGH (ref 6–20)
CO2: 25 mmol/L (ref 22–32)
Calcium: 8.2 mg/dL — ABNORMAL LOW (ref 8.9–10.3)
Chloride: 98 mmol/L — ABNORMAL LOW (ref 101–111)
Creatinine, Ser: 0.86 mg/dL (ref 0.61–1.24)
GFR calc Af Amer: >60 mL/min (ref >60)
GFR calc non Af Amer: >60 mL/min (ref >60)
Glucose, Bld: 92 mg/dL (ref 65–99)
Potassium: 4.7 mmol/L (ref 3.5–5.1)
Sodium: 127 mmol/L — ABNORMAL LOW (ref 135–145)

## 2015-03-20 LAB — PREPARE RBC (CROSSMATCH)

## 2015-03-20 MED ORDER — SODIUM CHLORIDE 0.9 % IV SOLN
Freq: Once | INTRAVENOUS | Status: AC
  Administered 2015-03-20: 13:00:00 via INTRAVENOUS

## 2015-03-20 MED ORDER — FUROSEMIDE 10 MG/ML IJ SOLN
40.0000 mg | Freq: Once | INTRAMUSCULAR | Status: AC
  Administered 2015-03-20: 40 mg via INTRAVENOUS
  Filled 2015-03-20: qty 4

## 2015-03-20 MED ORDER — INSULIN ASPART 100 UNIT/ML ~~LOC~~ SOLN
0.0000 [IU] | Freq: Three times a day (TID) | SUBCUTANEOUS | Status: DC
  Administered 2015-03-20: 2 [IU] via SUBCUTANEOUS

## 2015-03-20 NOTE — Evaluation (Signed)
Physical Therapy Evaluation Patient Details Name: [name] MRN: [id number] DOB: 1941-01-31 Today's Date: 03/20/2015   History of Present Illness  Mr Ahlborn is a 74 year old with a history of HTN and COPD was admitted in 3/16 with atrial fibrillation/RVR and dyspnea. Echo showed low EF (15-20% range). He had TEE-guided DCCV to NSR and cardiac catheterization. Cath showed no obstructive coronary disease (nonischemic cardiomyopathy). Echo showed dilated aortic root. 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  BIOLOGIC BENTALL PROCEDURE WITH 27 TISSUE VALVE AND 30 VALSALVA GRAFT  Clinical Impression  Pt admitted with above diagnosis. Pt currently with functional limitations due to the deficits listed below (see PT Problem List). Pt ambulated well today but will still benefit from SNF with therapy prior to d/c home as he has sternal precautions and will need time to follow them for daily tasks. Will follow acutely.  Pt will benefit from skilled PT to increase their independence and safety with mobility to allow discharge to the venue listed below.      Follow Up Recommendations SNF;Supervision/Assistance - 24 hour    Equipment Recommendations  Rolling walker with 5" wheels;3in1 (PT)    Recommendations for Other Services       Precautions / Restrictions Precautions Precautions: Fall Restrictions Weight Bearing Restrictions: No Other Position/Activity Restrictions: sternal precautions      Mobility  Bed Mobility Overal bed mobility: Needs Assistance Bed Mobility: Sit to Supine       Sit to supine: Mod assist;+2 for physical assistance   General bed mobility comments: Assist and cues for sternal precautions to lie down.   Transfers Overall transfer level: Needs assistance Equipment used: Rolling walker (2 wheeled) Transfers: Sit to/from Stand Sit to Stand: Min assist         General transfer  comment: cues needed for hand placement.  Cues for sternal precautions.    Ambulation/Gait Ambulation/Gait assistance: Min assist;+2 physical assistance Ambulation Distance (Feet): 220 Feet Assistive device: Rolling walker (2 wheeled) Gait Pattern/deviations: Step-through pattern;Decreased stride length;Trunk flexed;Wide base of support   Gait velocity interpretation: <1.8 ft/sec, indicative of risk for recurrent falls General Gait Details: Pt needed assist to steer RW at times and to stay close to RW.  Pt with good steadiness with UE support.    Stairs            Wheelchair Mobility    Modified Rankin (Stroke Patients Only)       Balance Overall balance assessment: Needs assistance Sitting-balance support: No upper extremity supported;Feet supported Sitting balance-Leahy Scale: Fair     Standing balance support: Bilateral upper extremity supported;During functional activity Standing balance-Leahy Scale: Poor Standing balance comment: Pt using UE support bil for balance in standing.                              Pertinent Vitals/Pain Pain Assessment: Faces Faces Pain Scale: Hurts little more Pain Location: incision Pain Descriptors / Indicators: Aching;Sore Pain Intervention(s): Limited activity within patient's tolerance;Monitored during session;Repositioned  80 bpm, 97/53 pre BP, 108/53 post BP, 100% O2 on RA.     Home Living Family/patient expects to be discharged to:: Skilled nursing facility Living Arrangements: Alone Available Help at Discharge: Friend(s);Available PRN/intermittently Type of Home: House Home Access: Stairs to enter Entrance Stairs-Rails: Right;Left;Can reach both Entrance Stairs-Number of Steps: 7 Home Layout: One level Home Equipment: Walker - standard;Cane -  single point      Prior Function Level of Independence: Independent               Hand Dominance        Extremity/Trunk Assessment   Upper Extremity  Assessment: Defer to OT evaluation           Lower Extremity Assessment: Generalized weakness      Cervical / Trunk Assessment: Normal  Communication   Communication: No difficulties  Cognition Arousal/Alertness: Awake/alert Behavior During Therapy: WFL for tasks assessed/performed Overall Cognitive Status: Within Functional Limits for tasks assessed                      General Comments      Exercises        Assessment/Plan    PT Assessment Patient needs continued PT services  PT Diagnosis Generalized weakness   PT Problem List Decreased activity tolerance;Decreased balance;Decreased mobility;Decreased knowledge of use of DME;Decreased safety awareness;Decreased knowledge of precautions  PT Treatment Interventions DME instruction;Gait training;Stair training;Functional mobility training;Therapeutic activities;Therapeutic exercise;Balance training;Patient/family education   PT Goals (Current goals can be found in the Care Plan section) Acute Rehab PT Goals Patient Stated Goal: to go home after rehab PT Goal Formulation: With patient Time For Goal Achievement: 04/03/15 Potential to Achieve Goals: Good    Frequency Min 3X/week   Barriers to discharge Decreased caregiver support      Co-evaluation               End of Session Equipment Utilized During Treatment: Gait belt Activity Tolerance: Patient limited by fatigue Patient left: with call bell/phone within reach;in bed Nurse Communication: Mobility status         Time: [id number] PT Time Calculation (min) (ACUTE ONLY): 23 min   Charges:   PT Evaluation $Initial PT Evaluation Tier I: 1 Procedure PT Treatments $Gait Training: 8-22 mins   PT G CodesDenice Paradise 04/04/15, 2:58 PM  [employer] [facility] [phone] (pager)

## 2015-03-20 NOTE — Progress Notes (Signed)
3 Days Post-Op Procedure(s) (LRB): BIOLOGIC BENTALL PROCEDURE WITH 27 TISSUE VALVE AND 30 VALSALVA GRAFT (N/A) CLIPPING OF LEFT ATRIAL APPENDAGE (N/A) TRANSESOPHAGEAL ECHOCARDIOGRAM (TEE) (N/A) LEFT BIPOLAR MAZE (N/A) Subjective:  No complaints. Walked well this am.  Objective: Vital signs in last 24 hours: Temp:  [97.2 F (36.2 C)-98.9 F (37.2 C)] 97.4 F (36.3 C) (11/19 0735) Pulse Rate:  [71-93] 80 (11/19 0900) Cardiac Rhythm:  [-] Atrial paced (11/19 0800) Resp:  [8-27] 8 (11/19 0900) BP: (84-109)/(47-64) 97/53 mmHg (11/19 0900) SpO2:  [91 %-100 %] 93 % (11/19 0925) Arterial Line BP: (81-95)/(34-39) 93/39 mmHg (11/18 1230) Weight:  [72.7 kg (160 lb 4.4 oz)] 72.7 kg (160 lb 4.4 oz) (11/19 0500)  Hemodynamic parameters for last 24 hours:    Intake/Output from previous day: 11/18 0701 - 11/19 0700 In: 1807.2 [P.O.:360; I.V.:1062.2; Blood:335; IV Piggyback:50] Out: [id number] [Urine:1070] Intake/Output this shift: Total I/O In: 327.8 [P.O.:240; I.V.:87.8] Out: 50 [Urine:50]  General appearance: alert and cooperative Neurologic: intact Heart: regular rate and rhythm, S1, S2 normal, no murmur, click, rub or gallop Lungs: clear to auscultation bilaterally Extremities: extremities normal, atraumatic, no cyanosis or edema Wound: incision ok  Lab Results:  Recent Labs  03/19/15 0330 03/20/15 0357  WBC 10.3 10.6*  HGB 7.8* 8.0*  HCT 22.7* 23.6*  PLT 70* 67*   BMET:  Recent Labs  03/19/15 0330 03/20/15 0357  NA 129* 127*  K 4.6 4.7  CL 99* 98*  CO2 24 25  GLUCOSE 112* 92  BUN 22* 25*  CREATININE 0.99 0.86  CALCIUM 8.1* 8.2*    PT/INR:  Recent Labs  03/17/15 1730  LABPROT 24.1*  INR 2.18*   ABG    Component Value Date/Time   PHART 7.281* 03/18/2015 0144   HCO3 21.2 03/18/2015 0144   TCO2 18 03/18/2015 1530   ACIDBASEDEF 5.0* 03/18/2015 0144   O2SAT 98.0 03/18/2015 0144   CBG (last 3)   Recent Labs  03/19/15 2355 03/20/15 0401 03/20/15 0732   GLUCAP 128* 81 86    Assessment/Plan: S/P Procedure(s) (LRB): BIOLOGIC BENTALL PROCEDURE WITH 27 TISSUE VALVE AND 30 VALSALVA GRAFT (N/A) CLIPPING OF LEFT ATRIAL APPENDAGE (N/A) TRANSESOPHAGEAL ECHOCARDIOGRAM (TEE) (N/A) LEFT BIPOLAR MAZE (N/A)  He has borderline low BP on dopamine 3. Will try to wean  Rhythm is sinus 70's but drops down into the 60's. On chronic amio for history of atrial fib s/p MAZE. Atrial pacing to maintain BP. Hold off on beta blocker due to rate.  Expected acute blood loss anemia: Hbg did not rise much after transfusion of one unit yesterday. Since he still has borderline low BP on dopamine will transfuse another unit today. He is on iron.  Volume excess: gentle diuresis after transfusion.  IS, ambulation  Thrombocytopenia: observe.     LOS: 3 days    [name] 03/20/2015

## 2015-03-21 LAB — BASIC METABOLIC PANEL
ANION GAP: 4 — AB (ref 5–15)
BUN: 23 mg/dL — ABNORMAL HIGH (ref 6–20)
CO2: 27 mmol/L (ref 22–32)
Calcium: 7.9 mg/dL — ABNORMAL LOW (ref 8.9–10.3)
Chloride: 97 mmol/L — ABNORMAL LOW (ref 101–111)
Creatinine, Ser: 0.82 mg/dL (ref 0.61–1.24)
GFR calc Af Amer: >60 mL/min (ref >60)
GFR calc non Af Amer: >60 mL/min (ref >60)
GLUCOSE: 121 mg/dL — AB (ref 65–99)
POTASSIUM: 3.9 mmol/L (ref 3.5–5.1)
Sodium: 128 mmol/L — ABNORMAL LOW (ref 135–145)

## 2015-03-21 LAB — GLUCOSE, CAPILLARY
Glucose-Capillary: 69 mg/dL (ref 65–99)
Glucose-Capillary: 55 mg/dL — ABNORMAL LOW (ref 65–99)
Glucose-Capillary: 66 mg/dL (ref 65–99)

## 2015-03-21 LAB — CBC
HEMATOCRIT: 23.5 % — AB (ref 39.0–52.0)
Hemoglobin: 7.9 g/dL — ABNORMAL LOW (ref 13.0–17.0)
MCH: 30 pg (ref 26.0–34.0)
MCHC: 33.6 g/dL (ref 30.0–36.0)
MCV: 89.4 fL (ref 78.0–100.0)
Platelets: 81 10*3/uL — ABNORMAL LOW (ref 150–400)
RBC: 2.63 MIL/uL — AB (ref 4.22–5.81)
RDW: 14.7 % (ref 11.5–15.5)
WBC: 6.6 10*3/uL (ref 4.0–10.5)

## 2015-03-21 LAB — TYPE AND SCREEN
ABO/RH(D): O POS
Antibody Screen: NEGATIVE
Unit division: 0
Unit division: 0
Unit division: 0

## 2015-03-21 NOTE — Progress Notes (Signed)
4 Days Post-Op Procedure(s) (LRB): BIOLOGIC BENTALL PROCEDURE WITH 27 TISSUE VALVE AND 30 VALSALVA GRAFT (N/A) CLIPPING OF LEFT ATRIAL APPENDAGE (N/A) TRANSESOPHAGEAL ECHOCARDIOGRAM (TEE) (N/A) LEFT BIPOLAR MAZE (N/A) Subjective:  No complaints. Ambulated this am. He was a little dizzy when he stood up but it cleared and he walked 300 ft. Ate some toast but says he does not have much appetite.  Objective: Vital signs in last 24 hours: Temp:  [97.5 F (36.4 C)-98.3 F (36.8 C)] 97.5 F (36.4 C) (11/20 0720) Pulse Rate:  [60-94] 60 (11/20 0800) Cardiac Rhythm:  [-] Atrial paced (11/20 0800) Resp:  [8-20] 16 (11/20 0800) BP: (79-122)/(47-70) 96/63 mmHg (11/20 0800) SpO2:  [88 %-100 %] 93 % (11/20 0844) Weight:  [71.4 kg (157 lb 6.5 oz)] 71.4 kg (157 lb 6.5 oz) (11/20 0630)  Hemodynamic parameters for last 24 hours:    Intake/Output from previous day: 11/19 0701 - 11/20 0700 In: 1829.6 [P.O.:960; I.V.:533; Blood:336.7] Out: 1566 [Urine:1566] Intake/Output this shift: Total I/O In: -  Out: 35 [Urine:35]  General appearance: alert and cooperative Neurologic: intact Heart: regular rate and rhythm, S1, S2 normal, no murmur, click, rub or gallop Lungs: clear to auscultation bilaterally Abdomen: soft, non-tender; bowel sounds normal; no masses,  no organomegaly Extremities: extremities normal, atraumatic, no cyanosis or edema Wound: healing well.  Lab Results:  Recent Labs  03/20/15 0357 03/21/15 0319  WBC 10.6* 6.6  HGB 8.0* 7.9*  HCT 23.6* 23.5*  PLT 67* 81*   BMET:  Recent Labs  03/20/15 0357 03/21/15 0319  NA 127* 128*  K 4.7 3.9  CL 98* 97*  CO2 25 27  GLUCOSE 92 121*  BUN 25* 23*  CREATININE 0.86 0.82  CALCIUM 8.2* 7.9*    PT/INR: No results for input(s): LABPROT, INR in the last 72 hours. ABG    Component Value Date/Time   PHART 7.281* 03/18/2015 0144   HCO3 21.2 03/18/2015 0144   TCO2 18 03/18/2015 1530   ACIDBASEDEF 5.0* 03/18/2015 0144   O2SAT 98.0 03/18/2015 0144   CBG (last 3)   Recent Labs  03/21/15 0719 03/21/15 0810 03/21/15 0851  GLUCAP 69 55* 66    Assessment/Plan: S/P Procedure(s) (LRB): BIOLOGIC BENTALL PROCEDURE WITH 27 TISSUE VALVE AND 30 VALSALVA GRAFT (N/A) CLIPPING OF LEFT ATRIAL APPENDAGE (N/A) TRANSESOPHAGEAL ECHOCARDIOGRAM (TEE) (N/A) LEFT BIPOLAR MAZE (N/A)  He continues to run a borderline low BP and had to go back on dopamine for a while last night. He is back off dopamine this am and walked without it. His Hgb has not really come up much after another unit of PRBC's yesterday. He is on iron. No visible blood loss. Will recheck in the am.  Hyponatremic: liberalize sodium intake. He does not have any significant volume excess. Wt is less than 3 lbs over preop.   Glucose has been low so will stop Levemir and SSI.  DC sleeve and follow.  Continue mobilization and IS.   LOS: 4 days    Gaye Pollack 03/21/2015

## 2015-03-21 NOTE — Care Management Important Message (Signed)
Important Message  Patient Details  Name: [name] MRN: [id number] Date of Birth: 05/29/1940   Medicare Important Message Given:  Yes    Nathen May 03/21/2015, 8:36 AM

## 2015-03-22 ENCOUNTER — Inpatient Hospital Stay (HOSPITAL_COMMUNITY): Payer: Medicare Other

## 2015-03-22 DIAGNOSIS — I350 Nonrheumatic aortic (valve) stenosis: Secondary | ICD-10-CM

## 2015-03-22 DIAGNOSIS — I480 Paroxysmal atrial fibrillation: Secondary | ICD-10-CM

## 2015-03-22 LAB — CBC
HCT: 22.5 % — ABNORMAL LOW (ref 39.0–52.0)
Hemoglobin: 7.8 g/dL — ABNORMAL LOW (ref 13.0–17.0)
MCH: 31.2 pg (ref 26.0–34.0)
MCHC: 34.7 g/dL (ref 30.0–36.0)
MCV: 90 fL (ref 78.0–100.0)
Platelets: 106 10*3/uL — ABNORMAL LOW (ref 150–400)
RBC: 2.5 MIL/uL — ABNORMAL LOW (ref 4.22–5.81)
RDW: 14.3 % (ref 11.5–15.5)
WBC: 5.3 10*3/uL (ref 4.0–10.5)

## 2015-03-22 LAB — GLUCOSE, CAPILLARY: Glucose-Capillary: 32 mg/dL — CL (ref 65–99)

## 2015-03-22 MED ORDER — ENOXAPARIN SODIUM 30 MG/0.3ML ~~LOC~~ SOLN
30.0000 mg | SUBCUTANEOUS | Status: DC
  Administered 2015-03-22 – 2015-03-26 (×5): 30 mg via SUBCUTANEOUS
  Filled 2015-03-22 (×6): qty 0.3

## 2015-03-22 MED ORDER — ASPIRIN 81 MG PO CHEW
81.0000 mg | CHEWABLE_TABLET | Freq: Every day | ORAL | Status: DC
  Administered 2015-03-23 – 2015-03-26 (×4): 81 mg via ORAL
  Filled 2015-03-22 (×4): qty 1

## 2015-03-22 MED ORDER — BISACODYL 10 MG RE SUPP
10.0000 mg | Freq: Every day | RECTAL | Status: DC | PRN
  Filled 2015-03-22: qty 1

## 2015-03-22 MED ORDER — LISINOPRIL 2.5 MG PO TABS
2.5000 mg | ORAL_TABLET | Freq: Every day | ORAL | Status: DC
  Filled 2015-03-22 (×2): qty 1

## 2015-03-22 MED ORDER — MOMETASONE FURO-FORMOTEROL FUM 100-5 MCG/ACT IN AERO: 2.0000 | INHALATION_SPRAY | Freq: Two times a day (BID) | RESPIRATORY_TRACT | Status: DC

## 2015-03-22 MED ORDER — ALPRAZOLAM 0.25 MG PO TABS
0.2500 mg | ORAL_TABLET | Freq: Every day | ORAL | Status: DC
  Administered 2015-03-22 – 2015-03-25 (×4): 0.25 mg via ORAL
  Filled 2015-03-22 (×4): qty 1

## 2015-03-22 NOTE — Progress Notes (Signed)
Advanced Heart Failure Rounding Note   Subjective:     Over the weekend dopamine stopped. BP improved. Denies SOB. Ambulating around the unit.     Objective:   Weight Range:  Vital Signs:   Temp:  [97.2 F (36.2 C)-99.4 F (37.4 C)] 98.1 F (36.7 C) (11/21 0806) Pulse Rate:  [71-104] 71 (11/21 0800) Resp:  [9-22] 12 (11/21 0800) BP: (83-130)/(47-79) 112/68 mmHg (11/21 0800) SpO2:  [87 %-100 %] 92 % (11/21 0821) Weight:  [163 lb 12.8 oz (74.3 kg)] 163 lb 12.8 oz (74.3 kg) (11/21 0500) Last BM Date: 03/18/15  Weight change: Filed Weights   03/20/15 0500 03/21/15 0630 03/22/15 0500  Weight: 160 lb 4.4 oz (72.7 kg) 157 lb 6.5 oz (71.4 kg) 163 lb 12.8 oz (74.3 kg)    Intake/Output:   Intake/Output Summary (Last 24 hours) at 03/22/15 0841 Last data filed at 03/22/15 0800  Gross per 24 hour  Intake    120 ml  Output   1025 ml  Net   -905 ml     Physical Exam: General:  Well appearing. No resp difficulty. Sitting in the chair.  HEENT: normal Neck: supple. JVP 7-8  . Carotids 2+ bilat; no bruits. No lymphadenopathy or thryomegaly appreciated. Cor: PMI nondisplaced. Regular rate & rhythm. No rubs, gallops or murmurs. Sternal incision approximated. Pacing wires in place.  Lungs: clear Abdomen: soft, nontender, nondistended. No hepatosplenomegaly. No bruits or masses. Good bowel sounds. Extremities: no cyanosis, clubbing, rash, edema Neuro: alert & orientedx3, cranial nerves grossly intact. moves all 4 extremities w/o difficulty. Affect pleasant  Telemetry: A paced 80s   Labs: Basic Metabolic Panel:  Recent Labs Lab 03/15/15 1341  03/17/15 2315  03/18/15 0415 03/18/15 1530 03/19/15 0330 03/20/15 0357 03/21/15 0319  NA 132*  < >  --   < > 132* 132* 129* 127* 128*  K 4.3  < >  --   < > 4.5 5.0 4.6 4.7 3.9  CL 100*  < >  --   < > 105 98* 99* 98* 97*  CO2 26  --   --   --  21*  --  24 25 27   GLUCOSE 99  < >  --   < > 171* 217* 112* 92 121*  BUN 27*  < >  --    < > 20 21* 22* 25* 23*  CREATININE 1.19  < > 1.06  < > 1.04 1.01  0.90 0.99 0.86 0.82  CALCIUM 9.3  --   --   --  8.0*  --  8.1* 8.2* 7.9*  MG  --   --  2.8*  --  2.5* 2.3  --   --   --   < > = values in this interval not displayed.  Liver Function Tests:  Recent Labs Lab 03/15/15 1341  AST 25  ALT 26  ALKPHOS 60  BILITOT 0.6  PROT 6.4*  ALBUMIN 3.9   No results for input(s): LIPASE, AMYLASE in the last 168 hours. No results for input(s): AMMONIA in the last 168 hours.  CBC:  Recent Labs Lab 03/18/15 1530 03/19/15 0330 03/20/15 0357 03/21/15 0319 03/22/15 0217  WBC 11.6* 10.3 10.6* 6.6 5.3  HGB 8.1*  7.5* 7.8* 8.0* 7.9* 7.8*  HCT 23.7*  22.0* 22.7* 23.6* 23.5* 22.5*  MCV 89.1 90.4 89.7 89.4 90.0  PLT 73* 70* 67* 81* 106*    Cardiac Enzymes: No results for input(s): CKTOTAL, CKMB, CKMBINDEX, TROPONINI in the  last 168 hours.  BNP: BNP (last 3 results)  Recent Labs  07/10/14 0150 07/10/14 0925  BNP 619.0* 758.2*    ProBNP (last 3 results) No results for input(s): PROBNP in the last 8760 hours.    Other results:  Imaging:  No results found.   Medications:     Scheduled Medications: . acetaminophen  1,000 mg Oral 4 times per day   Or  . acetaminophen (TYLENOL) oral liquid 160 mg/5 mL  1,000 mg Per Tube 4 times per day  . amiodarone  200 mg Oral Daily  . aspirin EC  325 mg Oral Daily   Or  . aspirin  324 mg Per Tube Daily  . atorvastatin  20 mg Oral q1800  . bisacodyl  10 mg Oral Daily   Or  . bisacodyl  10 mg Rectal Daily  . dextrose  28 mL Intravenous Once  . docusate sodium  200 mg Oral Daily  . ferrous gluconate  324 mg Oral BID WC  . folic acid  1 mg Oral Daily  . mometasone-formoterol  2 puff Inhalation BID  . multivitamin with minerals  1 tablet Oral Daily  . pantoprazole  40 mg Oral Daily  . sodium chloride  3 mL Intravenous Q12H  . tiotropium  18 mcg Inhalation Daily     Infusions: . sodium chloride Stopped (03/20/15  1800)  . sodium chloride    . DOPamine Stopped (03/21/15 0800)  . lactated ringers Stopped (03/20/15 1800)  . lactated ringers 20 mL/hr at 03/18/15 0700     PRN Medications:  sodium chloride, ondansetron (ZOFRAN) IV, oxyCODONE, sodium chloride, traMADol   Assessment/Plan   Admitted for aortic root replacrment.   1. S/P  BENTALL with bioprosthetic aortic valve, clipping L atrial appendage. Paced. Doing well.  Off all drips. Stable off dopamine.   2. Anemia: Expected post operative blood loss. Hgb 7.8. Follow CBC. 3. Chronic Systolic HF NICM. CMRI EF 51% (9/16, improved). Dilated aortic root, mildly dilated RV.  - No bb for now.  - Volume status ok. No diuretic for now.  BMET pending.  4. PAF: Prior to admit was on amio 200 mg daily + eliquis 5 mg twice daily.  On amiodarone 200 mg daily.  No eliquis for now. Will restart eliquis per CT surgery.  5. COPD  6. Hyperlipidemia: on statin  7. Hyponatremia: BMET pending.   Length of Stay: Imogene NP_C  03/22/2015, 8:41 AM  Advanced Heart Failure Team Pager [phone] (M-F; 7a - 4p)  Please contact [facility] for night-coverage after hours (4p -7a ) and weekends on [email or web address]  Patient seen with NP, agree with the above note.  He is stable today.  Off dopamine.  Remains in NSR.  Will add back low dose lisinopril today, low dose Coreg tomorrow if he tolerates.  Will get echo to assess prosthetic valve and EF.   [name] 03/22/2015 11:45 AM

## 2015-03-22 NOTE — Progress Notes (Signed)
5 Days Post-Op Procedure(s) (LRB): BIOLOGIC BENTALL PROCEDURE WITH 27 TISSUE VALVE AND 30 VALSALVA GRAFT (N/A) CLIPPING OF LEFT ATRIAL APPENDAGE (N/A) TRANSESOPHAGEAL ECHOCARDIOGRAM (TEE) (N/A) LEFT BIPOLAR MAZE (N/A) Subjective: BP 92/50 off dopamine Chronic Afib Eco today- EF .50, AVR nl, no pericar effusion,  large L pleural effusion - CXR 2 view in am No BM C/o insomnia Hb stable 7.8 - received blood over w/e Objective: Vital signs in last 24 hours: Temp:  [97.6 F (36.4 C)-99.4 F (37.4 C)] 97.8 F (36.6 C) (11/21 1100) Pulse Rate:  [71-104] 78 (11/21 1500) Cardiac Rhythm:  [-] Atrial paced (11/21 0400) Resp:  [9-22] 16 (11/21 1500) BP: (83-130)/(51-79) 102/59 mmHg (11/21 1500) SpO2:  [87 %-100 %] 95 % (11/21 1500) Weight:  [163 lb 12.8 oz (74.3 kg)] 163 lb 12.8 oz (74.3 kg) (11/21 0500)  Hemodynamic parameters for last 24 hours:  stable  Intake/Output from previous day: 11/20 0701 - 11/21 0700 In: -  Out: 1060 [Urine:1060] Intake/Output this shift: Total I/O In: 600 [P.O.:600] Out: 250 [Urine:250]  Neuro intact trace pedal edema  Lab Results:  Recent Labs  03/21/15 0319 03/22/15 0217  WBC 6.6 5.3  HGB 7.9* 7.8*  HCT 23.5* 22.5*  PLT 81* 106*   BMET:  Recent Labs  03/20/15 0357 03/21/15 0319  NA 127* 128*  K 4.7 3.9  CL 98* 97*  CO2 25 27  GLUCOSE 92 121*  BUN 25* 23*  CREATININE 0.86 0.82  CALCIUM 8.2* 7.9*    PT/INR: No results for input(s): LABPROT, INR in the last 72 hours. ABG    Component Value Date/Time   PHART 7.281* 03/18/2015 0144   HCO3 21.2 03/18/2015 0144   TCO2 18 03/18/2015 1530   ACIDBASEDEF 5.0* 03/18/2015 0144   O2SAT 98.0 03/18/2015 0144   CBG (last 3)   Recent Labs  03/21/15 0719 03/21/15 0810 03/21/15 0851  GLUCAP 69 55* 66    Assessment/Plan: S/P Procedure(s) (LRB): BIOLOGIC BENTALL PROCEDURE WITH 27 TISSUE VALVE AND 30 VALSALVA GRAFT (N/A) CLIPPING OF LEFT ATRIAL APPENDAGE (N/A) TRANSESOPHAGEAL  ECHOCARDIOGRAM (TEE) (N/A) LEFT BIPOLAR MAZE (N/A)  Resume BIPAP he uses at home Add low dose xanax which he takes at home No Eliquis but add low dose Lovenox, ASA 81 mg now Plts > 100k   LOS: 5 days    [name] 03/22/2015

## 2015-03-22 NOTE — Progress Notes (Signed)
Physical Therapy Treatment Patient Details Name: [name] MRN: [id number] DOB: [birth year] Today's Date: 03/22/2015    History of Present Illness [name] is a 74 year old with a history of HTN and COPD was admitted in 3/16 with atrial fibrillation/RVR and dyspnea. Echo showed low EF (15-20% range). He had TEE-guided DCCV to NSR and cardiac catheterization. Cath showed no obstructive coronary disease (nonischemic cardiomyopathy). Echo showed dilated aortic root. 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  BIOLOGIC BENTALL PROCEDURE WITH 27 TISSUE VALVE AND 30 VALSALVA GRAFT    PT Comments    Patient progressing well towards PT goals. Requires cues to adhere to sternal precautions during transfers and mobility. Requires Mod A for bed mobility and Min A for standing. Highly motivated to return to PLOF. Will follow acutely per current POC.   Follow Up Recommendations  SNF;Supervision/Assistance - 24 hour     Equipment Recommendations  Rolling walker with 5" wheels    Recommendations for Other Services       Precautions / Restrictions Precautions Precautions: Fall;Sternal Restrictions Weight Bearing Restrictions: No    Mobility  Bed Mobility Overal bed mobility: Needs Assistance Bed Mobility: Supine to Sit     Supine to sit: Mod assist;HOB elevated     General bed mobility comments: Mod A to elevate trunk due to inability to use UEs. Able to bring BLEs to EOB without assist.   Transfers Overall transfer level: Needs assistance Equipment used:  (w/c) Transfers: Sit to/from Stand Sit to Stand: Min assist         General transfer comment: Min A to boost from EOB with cues for anterior translation, holding pillow. Cues to adhere to sternal precautions. Transferred to chair post ambulation bout.  Ambulation/Gait Ambulation/Gait assistance: Min guard Ambulation Distance (Feet): 400  Feet Assistive device:  (pushed w/c) Gait Pattern/deviations: Step-through pattern;Decreased stride length   Gait velocity interpretation: <1.8 ft/sec, indicative of risk for recurrent falls General Gait Details: Steady gait. Sp02 decreased to 82% on Mark. Cues for pursed lip breathing with a few short standing rest breaks- resolved quickly.   Stairs            Wheelchair Mobility    Modified Rankin (Stroke Patients Only)       Balance Overall balance assessment: Needs assistance Sitting-balance support: Feet supported;No upper extremity supported Sitting balance-Leahy Scale: Fair Sitting balance - Comments: Pt with LOB whena ttempting to scoot bottom to EOB.   Standing balance support: During functional activity;Bilateral upper extremity supported Standing balance-Leahy Scale: Poor Standing balance comment: Relient on UEs for support.                     Cognition Arousal/Alertness: Awake/alert Behavior During Therapy: WFL for tasks assessed/performed Overall Cognitive Status: Within Functional Limits for tasks assessed                      Exercises      General Comments        Pertinent Vitals/Pain Pain Assessment: No/denies pain    Home Living                      Prior Function            PT Goals (current goals can now be found in the care plan section) Progress towards PT goals: Progressing toward goals    Frequency  Min 3X/week    PT Plan Current plan remains appropriate    Co-evaluation             End of Session Equipment Utilized During Treatment: Gait belt Activity Tolerance: Patient tolerated treatment well Patient left: in chair;with call bell/phone within reach;with nursing/sitter in room     Time: 1426-1455 PT Time Calculation (min) (ACUTE ONLY): 29 min  Charges:  $Gait Training: 23-37 mins                    G Codes:      Izacc Demeyer A Sharlet Notaro 03/22/2015, 3:49 PM Wray Kearns, [place],  DPT [phone]

## 2015-03-22 NOTE — Care Management Note (Signed)
Case Management Note  Patient Details  Name: [name] MRN: [id number] Date of Birth: [birth year]  Subjective/Objective:      Home alone.  Plans for discharge to SNF [facility] for rehab.  PT consult in.  Will place SW consult              Action/Plan:   Expected Discharge Date:                  Expected Discharge Plan:  [place]  In-House Referral:  Clinical Social Work  Discharge planning Services  CM Consult  Post Acute Care Choice:    Choice offered to:     DME Arranged:    DME Agency:     HH Arranged:    [place] Agency:     Status of Service:  In process, will continue to follow  Medicare Important Message Given:  Yes Date Medicare IM Given:    Medicare IM give by:    Date Additional Medicare IM Given:    Additional Medicare Important Message give by:     If discussed at [place] of Stay Meetings, dates discussed:    Additional Comments:  Vergie Living, RN 03/22/2015, 10:44 AM

## 2015-03-22 NOTE — Progress Notes (Signed)
  Echocardiogram 2D Echocardiogram has been performed.  Bobbye Charleston 03/22/2015, 2:27 PM

## 2015-03-23 ENCOUNTER — Inpatient Hospital Stay (HOSPITAL_COMMUNITY): Payer: Medicare Other

## 2015-03-23 LAB — COMPREHENSIVE METABOLIC PANEL
ALT: 63 U/L (ref 17–63)
AST: 43 U/L — ABNORMAL HIGH (ref 15–41)
Albumin: 2.4 g/dL — ABNORMAL LOW (ref 3.5–5.0)
Alkaline Phosphatase: 119 U/L (ref 38–126)
Anion gap: 4 — ABNORMAL LOW (ref 5–15)
BUN: 16 mg/dL (ref 6–20)
CO2: 28 mmol/L (ref 22–32)
Calcium: 8.1 mg/dL — ABNORMAL LOW (ref 8.9–10.3)
Chloride: 98 mmol/L — ABNORMAL LOW (ref 101–111)
Creatinine, Ser: 0.87 mg/dL (ref 0.61–1.24)
GFR calc Af Amer: >60 mL/min (ref >60)
GFR calc non Af Amer: >60 mL/min (ref >60)
Glucose, Bld: 129 mg/dL — ABNORMAL HIGH (ref 65–99)
Potassium: 3.9 mmol/L (ref 3.5–5.1)
Sodium: 130 mmol/L — ABNORMAL LOW (ref 135–145)
Total Bilirubin: 0.5 mg/dL (ref 0.3–1.2)
Total Protein: 4.6 g/dL — ABNORMAL LOW (ref 6.5–8.1)

## 2015-03-23 LAB — CBC
HCT: 24.2 % — ABNORMAL LOW (ref 39.0–52.0)
Hemoglobin: 8.1 g/dL — ABNORMAL LOW (ref 13.0–17.0)
MCH: 30.3 pg (ref 26.0–34.0)
MCHC: 33.5 g/dL (ref 30.0–36.0)
MCV: 90.6 fL (ref 78.0–100.0)
Platelets: 127 10*3/uL — ABNORMAL LOW (ref 150–400)
RBC: 2.67 MIL/uL — ABNORMAL LOW (ref 4.22–5.81)
RDW: 14 % (ref 11.5–15.5)
WBC: 5.9 10*3/uL (ref 4.0–10.5)

## 2015-03-23 MED ORDER — PANTOPRAZOLE SODIUM 40 MG PO TBEC
40.0000 mg | DELAYED_RELEASE_TABLET | Freq: Every day | ORAL | Status: DC
  Administered 2015-03-24 – 2015-03-26 (×3): 40 mg via ORAL
  Filled 2015-03-23 (×3): qty 1

## 2015-03-23 MED ORDER — OXYCODONE HCL 5 MG PO TABS: 5.0000 mg | ORAL_TABLET | ORAL | Status: DC | PRN

## 2015-03-23 MED ORDER — ONDANSETRON HCL 4 MG/2ML IJ SOLN: 4.0000 mg | Freq: Four times a day (QID) | INTRAMUSCULAR | Status: DC | PRN

## 2015-03-23 MED ORDER — TRAMADOL HCL 50 MG PO TABS: 50.0000 mg | ORAL_TABLET | ORAL | Status: DC | PRN

## 2015-03-23 MED ORDER — SODIUM CHLORIDE 0.9 % IV SOLN: 250.0000 mL | INTRAVENOUS | Status: DC | PRN

## 2015-03-23 MED ORDER — DOCUSATE SODIUM 100 MG PO CAPS
200.0000 mg | ORAL_CAPSULE | Freq: Every day | ORAL | Status: DC
  Administered 2015-03-24 – 2015-03-26 (×3): 200 mg via ORAL
  Filled 2015-03-23 (×3): qty 2

## 2015-03-23 MED ORDER — MOVING RIGHT ALONG BOOK
Freq: Once | Status: AC
Start: 2015-03-23 — End: 2015-03-23
  Administered 2015-03-23: 13:00:00
  Filled 2015-03-23: qty 1

## 2015-03-23 MED ORDER — SODIUM CHLORIDE 0.9 % IJ SOLN: 3.0000 mL | INTRAMUSCULAR | Status: DC | PRN

## 2015-03-23 MED ORDER — LISINOPRIL 2.5 MG PO TABS
2.5000 mg | ORAL_TABLET | Freq: Every day | ORAL | Status: DC
  Administered 2015-03-23 – 2015-03-24 (×2): 2.5 mg via ORAL
  Filled 2015-03-23 (×3): qty 1

## 2015-03-23 MED ORDER — BISACODYL 10 MG RE SUPP: 10.0000 mg | Freq: Every day | RECTAL | Status: DC | PRN

## 2015-03-23 MED ORDER — SODIUM CHLORIDE 0.9 % IJ SOLN
3.0000 mL | Freq: Two times a day (BID) | INTRAMUSCULAR | Status: DC
  Administered 2015-03-23 – 2015-03-26 (×6): 3 mL via INTRAVENOUS

## 2015-03-23 MED ORDER — ONDANSETRON HCL 4 MG PO TABS: 4.0000 mg | ORAL_TABLET | Freq: Four times a day (QID) | ORAL | Status: DC | PRN

## 2015-03-23 MED ORDER — CARVEDILOL 6.25 MG PO TABS
6.2500 mg | ORAL_TABLET | Freq: Two times a day (BID) | ORAL | Status: DC
  Administered 2015-03-23 – 2015-03-26 (×7): 6.25 mg via ORAL
  Filled 2015-03-23 (×7): qty 1

## 2015-03-23 MED ORDER — BISACODYL 5 MG PO TBEC: 10.0000 mg | DELAYED_RELEASE_TABLET | Freq: Every day | ORAL | Status: DC | PRN

## 2015-03-23 NOTE — Progress Notes (Signed)
[id number] Came to see pt to walk. Pt walked over and still tired. Stated he was cold. Went to 2S and got pt heated blankets. Will follow up tomorrow. Pt to walk with staff later. Graylon Good RN BSN 03/23/2015 2:31 PM

## 2015-03-23 NOTE — Progress Notes (Signed)
Patient ID: [name], male   DOB: [birth year], 74 y.o.   MRN: [id number] TCTS DAILY ICU PROGRESS NOTE                   [place]            [employer] [id number]          [phone]   6 Days Post-Op Procedure(s) (LRB): BIOLOGIC BENTALL PROCEDURE WITH 27 TISSUE VALVE AND 30 VALSALVA GRAFT (N/A) CLIPPING OF LEFT ATRIAL APPENDAGE (N/A) TRANSESOPHAGEAL ECHOCARDIOGRAM (TEE) (N/A) LEFT BIPOLAR MAZE (N/A)  Total Length of Stay:  LOS: 6 days   Subjective: Awake and alert, neuro intact  Objective: Vital signs in last 24 hours: Temp:  [97.4 F (36.3 C)-100.4 F (38 C)] 100.4 F (38 C) (11/22 0700) Pulse Rate:  [66-87] 80 (11/22 0800) Cardiac Rhythm:  [-] Normal sinus rhythm (11/22 0800) Resp:  [12-24] 17 (11/22 0800) BP: (92-136)/(54-87) 104/60 mmHg (11/22 0800) SpO2:  [93 %-100 %] 100 % (11/22 0807) Weight:  [163 lb 9.6 oz (74.208 kg)] 163 lb 9.6 oz (74.208 kg) (11/22 0600)  Filed Weights   03/21/15 0630 03/22/15 0500 03/23/15 0600  Weight: 157 lb 6.5 oz (71.4 kg) 163 lb 12.8 oz (74.3 kg) 163 lb 9.6 oz (74.208 kg)    Weight change: -3.2 oz (-0.092 kg)   Hemodynamic parameters for last 24 hours:    Intake/Output from previous day: 11/21 0701 - 11/22 0700 In: 1080 [P.O.:1080] Out: 950 [Urine:950]  Intake/Output this shift:    Current Meds: Scheduled Meds: . ALPRAZolam  0.25 mg Oral QHS  . amiodarone  200 mg Oral Daily  . aspirin  81 mg Oral Daily  . atorvastatin  20 mg Oral q1800  . bisacodyl  10 mg Oral Daily   Or  . bisacodyl  10 mg Rectal Daily  . dextrose  28 mL Intravenous Once  . docusate sodium  200 mg Oral Daily  . enoxaparin (LOVENOX) injection  30 mg Subcutaneous Q24H  . ferrous gluconate  324 mg Oral BID WC  . folic acid  1 mg Oral Daily  . lisinopril  2.5 mg Oral QHS  . mometasone-formoterol  2 puff Inhalation BID  . multivitamin with minerals  1 tablet Oral Daily  . pantoprazole  40 mg Oral Daily  . sodium chloride  3 mL  Intravenous Q12H  . tiotropium  18 mcg Inhalation Daily   Continuous Infusions: . sodium chloride Stopped (03/20/15 1800)  . sodium chloride     PRN Meds:.sodium chloride, bisacodyl, ondansetron (ZOFRAN) IV, oxyCODONE, sodium chloride, traMADol  General appearance: alert, cooperative and no distress Neurologic: intact Heart: regular rate and rhythm, S1, S2 normal, no murmur, click, rub or gallop Lungs: diminished breath sounds bibasilar Abdomen: soft, non-tender; bowel sounds normal; no masses,  no organomegaly Extremities: extremities normal, atraumatic, no cyanosis or edema and Homans sign is negative, no sign of DVT Wound: sternum intact  Lab Results: CBC: Recent Labs  03/22/15 0217 03/23/15 0241  WBC 5.3 5.9  HGB 7.8* 8.1*  HCT 22.5* 24.2*  PLT 106* 127*   BMET:  Recent Labs  03/21/15 0319 03/23/15 0241  NA 128* 130*  K 3.9 3.9  CL 97* 98*  CO2 27 28  GLUCOSE 121* 129*  BUN 23* 16  CREATININE 0.82 0.87  CALCIUM 7.9* 8.1*    PT/INR: No results for input(s): LABPROT, INR in the last 72 hours. Radiology: Dg Chest 2 View  03/23/2015  CLINICAL DATA:  Aortic valve replacement. Shortness of breath and weakness EXAM: CHEST  2 VIEW COMPARISON:  03/20/2015 FINDINGS: Small to moderate layering bilateral pleural effusion, with differential appearance likely related to inferior flow. There is associated atelectasis, at least segmental. Grossly stable cardiopericardial size and upper mediastinal contours in this patient with aortic valve replacement and left atrial exclusion. In the lateral projection there are gas bubbles over the mediastinum which likely reflects pneumomediastinum. No apical pneumothorax. Background COPD with hyperinflation. IMPRESSION: 1. Bibasilar atelectasis with layering pleural effusions. 2. Probable small residual pneumomediastinum. No apical pneumothorax. 3. Stable postoperative cardiopericardial size. Electronically Signed   By: Monte Fantasia M.D.   On:  03/23/2015 07:27   ECHO: Study Conclusions  - Left ventricle: The cavity size was normal. Wall thickness was increased in a pattern of mild LVH. Septal bounce, likely due to recent cardiac surgery. The estimated ejection fraction was 50%. Diffuse hypokinesis. Features are consistent with a pseudonormal left ventricular filling pattern, with concomitant abnormal relaxation and increased filling pressure (grade 2 diastolic dysfunction). - Aortic valve: Status post Bentall procedure with bioprosthetic aortic valve. The prosthetic valve appears to function normally. Mean gradient (S): 13 mm Hg. - Aorta: Status post Bental procedure with aortic root replacement. There is thickening of the aortic root post-operatively. - Mitral valve: Mildly calcified annulus. Normal thickness leaflets . There was trivial regurgitation. - Left atrium: The atrium was mildly dilated. - Right ventricle: The cavity size was normal. Systolic function was mildly reduced. - Right atrium: The atrium was moderately dilated. - Tricuspid valve: Peak RV-RA gradient (S): 30 mm Hg. - Pulmonary arteries: PA peak pressure: 38 mm Hg (S). - Systemic veins: IVC measured 2.3 cm with > 50% respirophasic variation, suggesting RA pressure 8 mmHg. - Pericardium, extracardiac: There was a significant left pleural effusion. A trivial pericardial effusion was identified.  Impressions:  - There was a large left pleural effusion and a trivial pericardial effusion. Normal LV size with mild LV hypertrophy. EF 50% with mild diffuse hypokinesis. Normally functioning bioprosthetic aortic valve. Normal RV size with mildly decreased systolic function. Mild pulmonary hypertension.  Assessment/Plan: S/P Procedure(s) (LRB): BIOLOGIC BENTALL PROCEDURE WITH 27 TISSUE VALVE AND 30 VALSALVA GRAFT (N/A) CLIPPING OF LEFT ATRIAL APPENDAGE (N/A) TRANSESOPHAGEAL ECHOCARDIOGRAM (TEE) (N/A) LEFT BIPOLAR MAZE  (N/A) Mobilize Diuresis Plan for transfer to step-down: see transfer orders Follow echo- mild hypokinesias EF 50%, prosthetic valve functioning well  To 2w, now off pacer , will resume eliquis after pacing wires are out Will need some diuretic as he tolerates    Grace Isaac 03/23/2015 10:20 AM

## 2015-03-23 NOTE — Progress Notes (Signed)
Advanced Heart Failure Rounding Note   Subjective:   Yesterday lisinopril started but was not given due to soft SBP. Weight unchanged.   Denies SOB/Orthopnea.   Creatinine 0.87.   Objective:   Weight Range:  Vital Signs:   Temp:  [97.4 F (36.3 C)-100.4 F (38 C)] 100.4 F (38 C) (11/22 0700) Pulse Rate:  [66-87] 80 (11/22 0800) Resp:  [12-24] 17 (11/22 0800) BP: (92-136)/(54-87) 104/60 mmHg (11/22 0800) SpO2:  [93 %-100 %] 100 % (11/22 0807) Weight:  [163 lb 9.6 oz (74.208 kg)] 163 lb 9.6 oz (74.208 kg) (11/22 0600) Last BM Date: 03/18/15  Weight change: Filed Weights   03/21/15 0630 03/22/15 0500 03/23/15 0600  Weight: 157 lb 6.5 oz (71.4 kg) 163 lb 12.8 oz (74.3 kg) 163 lb 9.6 oz (74.208 kg)    Intake/Output:   Intake/Output Summary (Last 24 hours) at 03/23/15 0843 Last data filed at 03/23/15 0600  Gross per 24 hour  Intake    960 ml  Output    950 ml  Net     10 ml     Physical Exam: General:  Well appearing. No resp difficulty. Sitting in the chair.  HEENT: normal Neck: supple. JVP flat.  Carotids 2+ bilat; no bruits. No lymphadenopathy or thryomegaly appreciated. Cor: PMI nondisplaced. Regular rate & rhythm. No rubs, gallops or murmurs. Sternal incision approximated. Pacing wires in place.  Lungs: clear Abdomen: soft, nontender, nondistended. No hepatosplenomegaly. No bruits or masses. Good bowel sounds. Extremities: no cyanosis, clubbing, rash, edema Neuro: alert & orientedx3, cranial nerves grossly intact. moves all 4 extremities w/o difficulty. Affect pleasant  Telemetry: A paced 80s   Labs: Basic Metabolic Panel:  Recent Labs Lab 03/17/15 2315  03/18/15 0415 03/18/15 1530 03/19/15 0330 03/20/15 0357 03/21/15 0319 03/23/15 0241  NA  --   < > 132* 132* 129* 127* 128* 130*  K  --   < > 4.5 5.0 4.6 4.7 3.9 3.9  CL  --   < > 105 98* 99* 98* 97* 98*  CO2  --   --  21*  --  24 25 27 28   GLUCOSE  --   < > 171* 217* 112* 92 121* 129*  BUN   --   < > 20 21* 22* 25* 23* 16  CREATININE 1.06  < > 1.04 1.01  0.90 0.99 0.86 0.82 0.87  CALCIUM  --   < > 8.0*  --  8.1* 8.2* 7.9* 8.1*  MG 2.8*  --  2.5* 2.3  --   --   --   --   < > = values in this interval not displayed.  Liver Function Tests:  Recent Labs Lab 03/23/15 0241  AST 43*  ALT 63  ALKPHOS 119  BILITOT 0.5  PROT 4.6*  ALBUMIN 2.4*   No results for input(s): LIPASE, AMYLASE in the last 168 hours. No results for input(s): AMMONIA in the last 168 hours.  CBC:  Recent Labs Lab 03/19/15 0330 03/20/15 0357 03/21/15 0319 03/22/15 0217 03/23/15 0241  WBC 10.3 10.6* 6.6 5.3 5.9  HGB 7.8* 8.0* 7.9* 7.8* 8.1*  HCT 22.7* 23.6* 23.5* 22.5* 24.2*  MCV 90.4 89.7 89.4 90.0 90.6  PLT 70* 67* 81* 106* 127*    Cardiac Enzymes: No results for input(s): CKTOTAL, CKMB, CKMBINDEX, TROPONINI in the last 168 hours.  BNP: BNP (last 3 results)  Recent Labs  07/10/14 0150 07/10/14 0925  BNP 619.0* 758.2*    ProBNP (last  3 results) No results for input(s): PROBNP in the last 8760 hours.    Other results:  Imaging: Dg Chest 2 View  03/23/2015  CLINICAL DATA:  Aortic valve replacement. Shortness of breath and weakness EXAM: CHEST  2 VIEW COMPARISON:  03/20/2015 FINDINGS: Small to moderate layering bilateral pleural effusion, with differential appearance likely related to inferior flow. There is associated atelectasis, at least segmental. Grossly stable cardiopericardial size and upper mediastinal contours in this patient with aortic valve replacement and left atrial exclusion. In the lateral projection there are gas bubbles over the mediastinum which likely reflects pneumomediastinum. No apical pneumothorax. Background COPD with hyperinflation. IMPRESSION: 1. Bibasilar atelectasis with layering pleural effusions. 2. Probable small residual pneumomediastinum. No apical pneumothorax. 3. Stable postoperative cardiopericardial size. Electronically Signed   By: Monte Fantasia M.D.   On: 03/23/2015 07:27     Medications:     Scheduled Medications: . ALPRAZolam  0.25 mg Oral QHS  . amiodarone  200 mg Oral Daily  . aspirin  81 mg Oral Daily  . atorvastatin  20 mg Oral q1800  . bisacodyl  10 mg Oral Daily   Or  . bisacodyl  10 mg Rectal Daily  . dextrose  28 mL Intravenous Once  . docusate sodium  200 mg Oral Daily  . enoxaparin (LOVENOX) injection  30 mg Subcutaneous Q24H  . ferrous gluconate  324 mg Oral BID WC  . folic acid  1 mg Oral Daily  . lisinopril  2.5 mg Oral Daily  . mometasone-formoterol  2 puff Inhalation BID  . multivitamin with minerals  1 tablet Oral Daily  . pantoprazole  40 mg Oral Daily  . sodium chloride  3 mL Intravenous Q12H  . tiotropium  18 mcg Inhalation Daily    Infusions: . sodium chloride Stopped (03/20/15 1800)  . sodium chloride      PRN Medications: sodium chloride, bisacodyl, ondansetron (ZOFRAN) IV, oxyCODONE, sodium chloride, traMADol   Assessment/Plan   Admitted for aortic root replacrment.   1. S/P  BENTALL with bioprosthetic aortic valve, clipping L atrial appendage. Paced. Doing well.  Off all drips. Stable off dopamine.  Starting aspirin todday.  2. Anemia: Expected post operative blood loss. Hgb 7.8>8.1 Follow CBC. 3. Chronic Systolic HF NICM. CMRI EF 51% (9/16, improved). Dilated aortic root, mildly dilated RV.  - No bb for now. Yesterday he was unable to start lisinopril with soft BP. Will switch lisinopril to bed time - Volume status ok. No diuretic for now.  Renal function ok.  4. PAF: Prior to admit was on amio 200 mg daily + eliquis 5 mg twice daily.  On amiodarone 200 mg daily.  On low dose lovenox . Anticoagulants per CT surgery.  5. COPD  6. Hyperlipidemia: on statin  7. Hyponatremia: Sodium 130    Length of Stay: [place] NP-C  03/23/2015, 8:43 AM  Advanced Heart Failure Team Pager [phone] (M-F; [place])  Please contact [facility] for night-coverage after  hours (4p -7a ) and weekends on [email or web address]  Patient seen with NP, agree with the above note.    He is stable today.  Continue current meds.  We discussed anticoagulation: he has PAF and has been on Eliquis.  He has now had bioprosthetic valve with LA appendage closure.  There is not much data on stroke risk after surgical LA appendage closure.  If patients are anticoagulation candidates, I have typically continued anticoagulation given lack of data.  However, Mr  Hulvey makes it clear that he will not take coumadin.  Eliquis use is questionable with a bioprosthetic aortic valve.  At this point, his preference is to take aspirin only, so for now that will be the plan going forward.  [name] 03/23/2015 12:42 PM

## 2015-03-23 NOTE — Progress Notes (Signed)
Patient placed on CPAP HS on EPAP of 7. 2L O2 bleed in. Patient tolerating well. RT will continue to monitor as needed.

## 2015-03-23 NOTE — Progress Notes (Signed)
Pt arrived to unit ambulating with 2S RN.  VS WNL.  Pt denies pain.  Pt alert and oriented.  Pt oriented to room and use of phone and call light.  All questions answered.  Telemetry applied and CCMD notified.  Will cont to monitor.

## 2015-03-23 NOTE — Progress Notes (Signed)
Physical Therapy Treatment Patient Details Name: [name] MRN: [id number] DOB: November 16, 1940 Today's Date: 03/23/2015    History of Present Illness [name] is a 74 year old with a history of HTN and COPD was admitted in 3/16 with atrial fibrillation/RVR and dyspnea. Echo showed low EF (15-20% range). He had TEE-guided DCCV to NSR and cardiac catheterization. Cath showed no obstructive coronary disease (nonischemic cardiomyopathy). Echo showed dilated aortic root. 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  BIOLOGIC BENTALL PROCEDURE WITH 27 TISSUE VALVE AND 30 VALSALVA GRAFT    PT Comments    Progressing well.  Doesn't always follow sternal precautions.  VSS.  Follow Up Recommendations  SNF;Supervision/Assistance - 24 hour     Equipment Recommendations  Rolling walker with 5" wheels    Recommendations for Other Services       Precautions / Restrictions Precautions Precautions: Fall;Sternal Restrictions Weight Bearing Restrictions: No Other Position/Activity Restrictions: sternal precautions    Mobility  Bed Mobility Overal bed mobility: Needs Assistance Bed Mobility: Supine to Sit     Supine to sit: Mod assist     General bed mobility comments: cues for sternal precautions, pt stated can't scoot without arms and proceeded to use arms against advice.  Transfers Overall transfer level: Needs assistance Equipment used: Rolling walker (2 wheeled) Transfers: Sit to/from Stand Sit to Stand: Min assist         General transfer comment: cues for sternal precautions  Ambulation/Gait Ambulation/Gait assistance: Min guard Ambulation Distance (Feet): 1000 Feet Assistive device: Rolling walker (2 wheeled) Gait Pattern/deviations: Step-through pattern Gait velocity: moderate Gait velocity interpretation: at or above normal speed for age/gender General Gait Details: steady gait,  Sats mid  90's   Stairs            Wheelchair Mobility    Modified Rankin (Stroke Patients Only)       Balance Overall balance assessment: Needs assistance   Sitting balance-Leahy Scale: Fair       Standing balance-Leahy Scale: Fair                      Cognition Arousal/Alertness: Awake/alert Behavior During Therapy: WFL for tasks assessed/performed Overall Cognitive Status: Within Functional Limits for tasks assessed                      Exercises      General Comments        Pertinent Vitals/Pain Pain Assessment: Faces Faces Pain Scale: Hurts little more Pain Location: sternal incision Pain Descriptors / Indicators: Aching;Sore Pain Intervention(s): Monitored during session    Home Living                      Prior Function            PT Goals (current goals can now be found in the care plan section) Acute Rehab PT Goals Patient Stated Goal: to go home after rehab PT Goal Formulation: With patient Time For Goal Achievement: 04/03/15 Potential to Achieve Goals: Good Progress towards PT goals: Progressing toward goals    Frequency  Min 3X/week    PT Plan Current plan remains appropriate    Co-evaluation             End of Session   Activity Tolerance: Patient tolerated treatment well Patient left: Other (comment) (in toilet, pt to pull call bell.)  Time: [id number] PT Time Calculation (min) (ACUTE ONLY): 13 min  Charges:  $Gait Training: 8-22 mins                    G Codes:      Alamin Mccuiston, Tessie Fass 03/23/2015, 5:27 PM 03/23/2015  Donnella Sham, [place] [phone]  (pager)

## 2015-03-23 NOTE — Progress Notes (Signed)
Ambulated from 2S12 to 2W29 on portable monitor. No changes.

## 2015-03-24 DIAGNOSIS — I5032 Chronic diastolic (congestive) heart failure: Secondary | ICD-10-CM

## 2015-03-24 LAB — CBC
HCT: 23.5 % — ABNORMAL LOW (ref 39.0–52.0)
Hemoglobin: 7.7 g/dL — ABNORMAL LOW (ref 13.0–17.0)
MCH: 30.2 pg (ref 26.0–34.0)
MCHC: 32.8 g/dL (ref 30.0–36.0)
MCV: 92.2 fL (ref 78.0–100.0)
Platelets: 155 10*3/uL (ref 150–400)
RBC: 2.55 MIL/uL — ABNORMAL LOW (ref 4.22–5.81)
RDW: 13.9 % (ref 11.5–15.5)
WBC: 6.7 10*3/uL (ref 4.0–10.5)

## 2015-03-24 LAB — BASIC METABOLIC PANEL
Anion gap: 6 (ref 5–15)
BUN: 14 mg/dL (ref 6–20)
CO2: 28 mmol/L (ref 22–32)
Calcium: 8.2 mg/dL — ABNORMAL LOW (ref 8.9–10.3)
Chloride: 96 mmol/L — ABNORMAL LOW (ref 101–111)
Creatinine, Ser: 0.83 mg/dL (ref 0.61–1.24)
GFR calc Af Amer: >60 mL/min (ref >60)
GFR calc non Af Amer: >60 mL/min (ref >60)
Glucose, Bld: 122 mg/dL — ABNORMAL HIGH (ref 65–99)
Potassium: 3.8 mmol/L (ref 3.5–5.1)
Sodium: 130 mmol/L — ABNORMAL LOW (ref 135–145)

## 2015-03-24 MED ORDER — FUROSEMIDE 40 MG PO TABS: 40.0000 mg | ORAL_TABLET | Freq: Every day | ORAL | Status: DC

## 2015-03-24 MED ORDER — SPIRONOLACTONE 25 MG PO TABS
12.5000 mg | ORAL_TABLET | Freq: Every day | ORAL | Status: DC
  Administered 2015-03-24 – 2015-03-26 (×3): 12.5 mg via ORAL
  Filled 2015-03-24 (×3): qty 1

## 2015-03-24 MED ORDER — POTASSIUM CHLORIDE CRYS ER 20 MEQ PO TBCR
20.0000 meq | EXTENDED_RELEASE_TABLET | Freq: Once | ORAL | Status: AC
  Administered 2015-03-24: 20 meq via ORAL
  Filled 2015-03-24: qty 1

## 2015-03-24 MED ORDER — POTASSIUM CHLORIDE CRYS ER 20 MEQ PO TBCR: 20.0000 meq | EXTENDED_RELEASE_TABLET | Freq: Once | ORAL | Status: DC

## 2015-03-24 NOTE — Progress Notes (Addendum)
      [address]       [place] [address]             [phone]        7 Days Post-Op Procedure(s) (LRB): BIOLOGIC BENTALL PROCEDURE WITH 27 TISSUE VALVE AND 30 VALSALVA GRAFT (N/A) CLIPPING OF LEFT ATRIAL APPENDAGE (N/A) TRANSESOPHAGEAL ECHOCARDIOGRAM (TEE) (N/A) LEFT BIPOLAR MAZE (N/A)  Subjective: Patient without specific complaints this am  Objective: Vital signs in last 24 hours: Temp:  [97.8 F (36.6 C)-98.8 F (37.1 C)] 98.8 F (37.1 C) (11/23 0439) Pulse Rate:  [70-82] 70 (11/23 0439) Cardiac Rhythm:  [-] Heart block (11/22 1900) Resp:  [16-22] 18 (11/23 0439) BP: (97-125)/(55-92) 113/60 mmHg (11/23 0439) SpO2:  [94 %-100 %] 94 % (11/23 0439) Weight:  [160 lb 7.9 oz (72.8 kg)] 160 lb 7.9 oz (72.8 kg) (11/23 0439)  Pre op weight 70 kg Current Weight  03/24/15 160 lb 7.9 oz (72.8 kg)      Intake/Output from previous day: 11/22 0701 - 11/23 0700 In: 480 [P.O.:480] Out: 300 [Urine:300]   Physical Exam:  Cardiovascular: RRR, no murmur Pulmonary: Clear to auscultation bilaterally; no rales, wheezes, or rhonchi. Abdomen: Soft, non tender, bowel sounds present. Extremities: Trace bilateral lower extremity edema. Wound: Clean and dry.  No erythema or signs of infection. Neurologic: Intact without focal deficits  Lab Results: CBC: Recent Labs  03/23/15 0241 03/24/15 0230  WBC 5.9 6.7  HGB 8.1* 7.7*  HCT 24.2* 23.5*  PLT 127* 155   BMET:  Recent Labs  03/23/15 0241 03/24/15 0230  NA 130* 130*  K 3.9 3.8  CL 98* 96*  CO2 28 28  GLUCOSE 129* 122*  BUN 16 14  CREATININE 0.87 0.83  CALCIUM 8.1* 8.2*    PT/INR:  Lab Results  Component Value Date   INR 2.18* 03/17/2015   INR 1.45 03/15/2015   INR 1.47 07/14/2014   ABG:  INR: Will add last result for INR, ABG once components are confirmed Will add last 4 CBG results once components are confirmed  Assessment/Plan:  1. CV - SR in the 60's. On Amiodarone 200 mg  daily,Coreg 6.25 mg bid, and Lisinopril 2.5 mg daily.  2.  Pulmonary - On room air. Encourage incentive spirometer 3. Mild volume Overload - Per heart failure, no need for diuretic at this time. 4.  Acute blood loss anemia - H and H decreased to 7.7 and 23.5 this am. Continue Fergon and folic acid. Will discuss with Dr.Kreig Parson if should transfuse 5. Fever to 100.4. No sign of wound infection. Likely secondary to atelectasis. 6. Supplement potassium 7. Remove EPW  ZIMMERMAN,DONIELLE MPA-C 03/24/2015,7:49 AM  Per cardiology , and since holding sinus and patient refuses coumadin will continue asa only To SNF soon, tomorrow or Friday I have seen and examined Marina Goodell and agree with the above assessment  and plan.  Grace Isaac MD [place] [phone] Office [phone] 03/24/2015 10:48 AM

## 2015-03-24 NOTE — Clinical Social Work Placement (Signed)
   CLINICAL SOCIAL WORK PLACEMENT  NOTE  Date:  03/24/2015  Patient Details  Name: [name] MRN: [id number] Date of Birth: [birth year]  Clinical Social Work is seeking post-discharge placement for this patient at the [place] level of care (*CSW will initial, date and re-position this form in  chart as items are completed):  Yes   Patient/family provided with [place] Work Department's list of facilities offering this level of care within the geographic area requested by the patient (or if unable, by the patient's family).  Yes   Patient/family informed of their freedom to choose among providers that offer the needed level of care, that participate in Medicare, Medicaid or managed care program needed by the patient, have an available bed and are willing to accept the patient.  Yes   Patient/family informed of [facility]'s ownership interest in [facility] and [facility], as well as of the fact that they are under no obligation to receive care at these facilities.  PASRR submitted to EDS on       PASRR number received on       Existing PASRR number confirmed on 03/24/15     FL2 transmitted to all facilities in geographic area requested by pt/family on 03/24/15     FL2 transmitted to all facilities within larger geographic area on       Patient informed that his/her managed care company has contracts with or will negotiate with certain facilities, including the following:            Patient/family informed of bed offers received.  Patient chooses bed at       Physician recommends and patient chooses bed at      Patient to be transferred to   on  .  Patient to be transferred to facility by       Patient family notified on   of transfer.  Name of family member notified:        PHYSICIAN       Additional Comment:    _______________________________________________ Liz Beach MSW, [place], [place], [id number]

## 2015-03-24 NOTE — NC FL2 (Signed)
[place] LEVEL OF CARE SCREENING TOOL     IDENTIFICATION  Patient Name: [name] Birthdate: December 10, 1940 Sex: male Admission Date (Current Location): 03/17/2015  [facility] and [place] Number: Herbalist and Address:  The . [facility], [employer] [address], [place], [place] [address]      Provider Number: [id number]  Attending Physician Name and Address:  Grace Isaac, MD  Relative Name and Phone Number:       Current Level of Care: Hospital Recommended Level of Care: [place] Prior Approval Number:    Date Approved/Denied:   PASRR Number: [id number]  Discharge Plan: SNF    Current Diagnoses: Patient Active Problem List   Diagnosis Date Noted  . AI (aortic insufficiency) 03/17/2015  . Paroxysmal atrial fibrillation ([place]) 12/18/2014  . COPD (chronic obstructive pulmonary disease) ([place]) 11/26/2014  . Coronary artery disease involving native coronary artery of native heart without angina pectoris 11/05/2014  . Chronic systolic CHF (congestive heart failure) ([place]) 09/09/2014  . Persistent atrial fibrillation ([place]) 09/09/2014  . 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 Emphysema of lung ([place]) 09/09/2014  . CHF exacerbation ([place]) 07/10/2014  . Acute systolic heart failure ([place]) 07/10/2014    Orientation ACTIVITIES/SOCIAL BLADDER RESPIRATION    Self, Time, Situation, Place  Active Continent Normal  BEHAVIORAL SYMPTOMS/MOOD NEUROLOGICAL BOWEL NUTRITION STATUS  Other (Comment) (NONE)  (NONE) Continent  (Heart Healthy Carb Modified)  PHYSICIAN VISITS COMMUNICATION OF NEEDS Height & Weight Skin    Verbally 6\' 2"  (188 cm) 160 lbs. Surgical wounds (Incision chest)          AMBULATORY STATUS RESPIRATION    Supervision limited Normal      Personal Care Assistance Level of Assistance  Bathing, Dressing Bathing Assistance: Limited assistance   Dressing Assistance: Limited assistance      Functional  Limitations Info   (NONE)             [place]  PT (By licensed PT) (Sternal precautions)     PT Frequency: 5x/week             Additional Factors Info  Allergies   Allergies Info: NKDA           Current Medications (03/24/2015): Current Facility-Administered Medications  Medication Dose Route Frequency Provider Last Rate Last Dose  . 0.9 %  sodium chloride infusion  250 mL Intravenous PRN Grace Isaac, MD      . ALPRAZolam Duanne Moron) tablet 0.25 mg  0.25 mg Oral QHS Ivin Poot, MD   0.25 mg at 03/23/15 2203  . amiodarone (PACERONE) tablet 200 mg  200 mg Oral Daily Grace Isaac, MD   200 mg at 03/24/15 1006  . aspirin chewable tablet 81 mg  81 mg Oral Daily Ivin Poot, MD   81 mg at 03/24/15 1006  . atorvastatin (LIPITOR) tablet 20 mg  20 mg Oral q1800 Nani Skillern, PA-C   20 mg at 03/23/15 1659  . bisacodyl (DULCOLAX) EC tablet 10 mg  10 mg Oral Daily PRN Grace Isaac, MD       Or  . bisacodyl (DULCOLAX) suppository 10 mg  10 mg Rectal Daily PRN Grace Isaac, MD      . carvedilol (COREG) tablet 6.25 mg  6.25 mg Oral BID WC Grace Isaac, MD   6.25 mg at 03/24/15 1501  . docusate sodium (COLACE) capsule 200 mg  200 mg Oral Daily  Grace Isaac, MD   200 mg at 03/24/15 1006  . enoxaparin (LOVENOX) injection 30 mg  30 mg Subcutaneous Q24H Ivin Poot, MD   30 mg at 03/24/15 1501  . ferrous gluconate (FERGON) tablet 324 mg  324 mg Oral BID WC Grace Isaac, MD   324 mg at 03/24/15 0819  . folic acid (FOLVITE) tablet 1 mg  1 mg Oral Daily Grace Isaac, MD   1 mg at 03/24/15 1006  . lisinopril (PRINIVIL,ZESTRIL) tablet 2.5 mg  2.5 mg Oral QHS Amy D Clegg, NP   2.5 mg at 03/23/15 2203  . mometasone-formoterol (DULERA) 100-5 MCG/ACT inhaler 2 puff  2 puff Inhalation BID Nani Skillern, PA-C   2 puff at 03/24/15 0859  . ondansetron (ZOFRAN) tablet 4 mg  4 mg Oral Q6H PRN Grace Isaac, MD        Or  . ondansetron [facility]) injection 4 mg  4 mg Intravenous Q6H PRN Grace Isaac, MD      . oxyCODONE (Oxy IR/ROXICODONE) immediate release tablet 5 mg  5 mg Oral Q4H PRN Grace Isaac, MD      . pantoprazole (PROTONIX) EC tablet 40 mg  40 mg Oral QAC breakfast Grace Isaac, MD   40 mg at 03/24/15 [id number]  . sodium chloride 0.9 % injection 3 mL  3 mL Intravenous Q12H Grace Isaac, MD   3 mL at 03/23/15 2204  . sodium chloride 0.9 % injection 3 mL  3 mL Intravenous PRN Grace Isaac, MD      . spironolactone (ALDACTONE) tablet 12.5 mg  12.5 mg Oral Daily Larey Dresser, MD   12.5 mg at 03/24/15 1446  . tiotropium (SPIRIVA) inhalation capsule 18 mcg  18 mcg Inhalation Daily Nani Skillern, PA-C   18 mcg at 03/22/15 [id number]  . traMADol (ULTRAM) tablet 50-100 mg  50-100 mg Oral Q4H PRN Grace Isaac, MD       Do not use this list as official medication orders. Please verify with discharge summary.  Discharge Medications:   Medication List    ASK your doctor about these medications        ADVAIR DISKUS 250-50 MCG/DOSE Aepb  Generic drug:  Fluticasone-Salmeterol  Inhale 1 puff into the lungs 2 (two) times daily.     ALPRAZolam 0.25 MG tablet  Commonly known as:  XANAX  Take 0.25 mg by mouth 2 (two) times daily as needed for anxiety or sleep.     amiodarone 200 MG tablet  Commonly known as:  PACERONE  Take 1 tablet (200 mg total) by mouth daily.     apixaban 5 MG Tabs tablet  Commonly known as:  ELIQUIS  Take 5 mg by mouth 2 (two) times daily.     atorvastatin 20 MG tablet  Commonly known as:  LIPITOR  Take 20 mg by mouth daily at 6 PM.     carvedilol 6.25 MG tablet  Commonly known as:  COREG  Take 1 tablet (6.25 mg total) by mouth 2 (two) times daily with a meal.     lisinopril 5 MG tablet  Commonly known as:  PRINIVIL,ZESTRIL  TAKE 1 TABLET BY MOUTH TWICE A DAY     multivitamin tablet  Take 1 tablet by mouth daily.     PROAIR HFA 108 (90 BASE)  MCG/ACT inhaler  Generic drug:  albuterol  Inhale 1 puff into the lungs every 4 (four) hours as needed  for wheezing or shortness of breath.     SPIRIVA HANDIHALER 18 MCG inhalation capsule  Generic drug:  tiotropium  Place 18 mcg into inhaler and inhale daily.     spironolactone 25 MG tablet  Commonly known as:  ALDACTONE  Take 0.5 tablets (12.5 mg total) by mouth daily.        Relevant Imaging Results:  Relevant Lab Results:  Recent Labs    Additional Information SSN: [id number]  Rigoberto Noel, LCSW

## 2015-03-24 NOTE — Progress Notes (Signed)
Pacing wires removed. Pt tolerated it well and has no complaints. He has been educated on the importance of remaining on bed rest. Vital signs have been taken. Will continue to monitor pt.

## 2015-03-24 NOTE — Discharge Summary (Signed)
Physician Discharge Summary       [address]       [place] [address]             [phone]    Patient ID: [name] MRN: [id number] DOB/AGE: 1940/06/21 74 y.o.  Admit date: 03/17/2015 Discharge date: 03/26/2015  Principle Diagnoses: 1. Severely dilated aortic root 2. Moderate aortic insufficiency 3. History of PAF  Additional Diagnoses:  1. History of hypertension 2. History of CHF 3. History of GI bleed 80' 4. History of asthma 5. Remote history of tobacco abuse  Procedure (s):  Biologic Bentall with aortic root replacement and reimplantation of coronary arteries, replacement of aortic valve and root with pericardial tissue valve [employer] model 3300TFX 27 mm serial [id number] and 30 mm Valsalva graft and pulmonary vein MAZE procedure with left-sided pulmonary isolation lesions, placement of 35 mm atrial clip by Dr. Servando Snare on 03/17/2015.  History of Presenting Illness: This is 74 year old male withhistory significant for mild coronary artery disease cath 2007, hypertension, prediabetic, hypercholesterolemia, degenerative joint disease, COPD, chronic atrial fibrillation, treated with rate control and anticoagulants. He was seen in the [facility] ER a few days before admission to [facility] in March 2016 and had a chest x-ray which showed signs of COPD . He came to the [facility] ED for further evaluation complaining of progressive increasing shortness of breath associated with coughing for approximately 2 weeks. Patient was noted to be in a fib with moderate ventricular response andCHF. Patient received IV Lasix with improvement in his symptoms.  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 Cardiac catheterization and TEE were performed. Echocardiogram revealed ejection fraction of 10-15%. Because of this reason, we have delayed on proceeding with prophylactic repair of his aortic root and ascending  aorta.   Symptomatically, his heart failure symptoms have continued to improve with aggressive treatment. He is followed closely in the heart failure clinic . He denies shortness of breath,  pedal edema, or orthopnea.   He is now back in sinus rhythm.  Cardiac MRI was obtained to evaluate his myocardium myocardial function and aortic root.  From a symptomatic standpoint, the patient's much improved from when he was first seen back in March 2016. Dr. Servando Snare discussed the need for [facility]. Potential risks, benefits, and complications were discussed with the patient and he agreed to proceed with surgery. He underwent a Bentall, MAZE, and clipping of LA appendage on 03/16/2017.  Brief Hospital Course:  The patient was extubated the evening of surgery without difficulty. He remained afebrile and hemodynamically stable. Gordy Councilman, a line, chest tubes, and foley were removed early in the post operative course. Coreg was started and titrated accordingly. He was volume over loaded and diuresed. He had ABL anemia. He was put on Fergon and folic acid. His last H and H was 7.9 and 23.9. He  was weaned off the insulin drip. The patient's glucose remained well controlled. The patient's HGA1C pre op was 6.1. He is likely pre diabetic and will require further surveillance of his HGA1C as an outpatient. The patient was felt surgically stable for transfer from the ICU to [facility] for further convalescence on 03/23/2015. He continues to progress with cardiac rehab. He was ambulating on room air. He has been tolerating a diet and has had a bowel movement. Epicardial pacing wires were removed on 11/23. Chest tube sutures will be removed today.  The patient is deconditioned and felt surgically  stable for discharge to a SNF  today.  Latest Vital Signs: Blood pressure 123/71, pulse 70, temperature 98.2 F (36.8 C), temperature source Oral, resp. rate 16, height 6\' 2"  (1.88 m), weight 164 lb (74.39 kg), SpO2 95 %.  Physical  Exam: Cardiovascular: RRR, no murmur Pulmonary: Clear to auscultation bilaterally; no rales, wheezes, or rhonchi. Abdomen: Soft, non tender, bowel sounds present. Extremities: Trace bilateral lower extremity edema. Wound: Clean and dry. No erythema or signs of infection. Neurologic: Intact without focal deficits  Discharge Condition:Stable and discharged to SNF  Recent laboratory studies:  Lab Results  Component Value Date   WBC 6.8 03/25/2015   HGB 7.9* 03/25/2015   HCT 23.9* 03/25/2015   MCV 91.6 03/25/2015   PLT 200 03/25/2015   Lab Results  Component Value Date   NA 130* 03/26/2015   K 3.9 03/26/2015   CL 99* 03/26/2015   CO2 26 03/26/2015   CREATININE 0.81 03/26/2015   GLUCOSE 111* 03/26/2015    Diagnostic Studies: Dg Chest 2 View  03/23/2015  CLINICAL DATA:  Aortic valve replacement. Shortness of breath and weakness EXAM: CHEST  2 VIEW COMPARISON:  03/20/2015 FINDINGS: Small to moderate layering bilateral pleural effusion, with differential appearance likely related to inferior flow. There is associated atelectasis, at least segmental. Grossly stable cardiopericardial size and upper mediastinal contours in this patient with aortic valve replacement and left atrial exclusion. In the lateral projection there are gas bubbles over the mediastinum which likely reflects pneumomediastinum. No apical pneumothorax. Background COPD with hyperinflation. IMPRESSION: 1. Bibasilar atelectasis with layering pleural effusions. 2. Probable small residual pneumomediastinum. No apical pneumothorax. 3. Stable postoperative cardiopericardial size. Electronically Signed   By: Monte Fantasia M.D.   On: 03/23/2015 07:27       Discharge Instructions    Amb Referral to Cardiac Rehabilitation    Complete by:  As directed   Diagnosis:  Valve Replacement/Repair  Valve:  Aortic         Discharge Medications:   Medication List    STOP taking these medications        apixaban 5 MG Tabs tablet    Commonly known as:  ELIQUIS     multivitamin tablet      TAKE these medications        ADVAIR DISKUS 250-50 MCG/DOSE Aepb  Generic drug:  Fluticasone-Salmeterol  Inhale 1 puff into the lungs 2 (two) times daily.     ALPRAZolam 0.25 MG tablet  Commonly known as:  XANAX  Take 0.25 mg by mouth 2 (two) times daily as needed for anxiety or sleep.     amiodarone 200 MG tablet  Commonly known as:  PACERONE  Take 1 tablet (200 mg total) by mouth daily.     aspirin 81 MG chewable tablet  Chew 1 tablet (81 mg total) by mouth daily.     atorvastatin 20 MG tablet  Commonly known as:  LIPITOR  Take 20 mg by mouth daily at 6 PM.     carvedilol 6.25 MG tablet  Commonly known as:  COREG  Take 1 tablet (6.25 mg total) by mouth 2 (two) times daily with a meal.     ferrous gluconate 324 MG tablet  Commonly known as:  FERGON  Take 1 tablet (324 mg total) by mouth daily with breakfast. For one month then stop.     folic acid 1 MG tablet  Commonly known as:  FOLVITE  Take 1 tablet (1 mg total) by mouth daily. For  one month then stop.     lisinopril 2.5 MG tablet  Commonly known as:  PRINIVIL,ZESTRIL  Take 1 tablet (2.5 mg total) by mouth daily.     PROAIR HFA 108 (90 BASE) MCG/ACT inhaler  Generic drug:  albuterol  Inhale 1 puff into the lungs every 4 (four) hours as needed for wheezing or shortness of breath.     SPIRIVA HANDIHALER 18 MCG inhalation capsule  Generic drug:  tiotropium  Place 18 mcg into inhaler and inhale daily.     spironolactone 25 MG tablet  Commonly known as:  ALDACTONE  Take 0.5 tablets (12.5 mg total) by mouth daily.     traMADol 50 MG tablet  Commonly known as:  ULTRAM  Take 1 tablet (50 mg total) by mouth every 6 (six) hours as needed for moderate pain.       The patient has been discharged on:   1.Beta Blocker:  Yes [  x ]                              No   [   ]                              If No, reason:  2.Ace Inhibitor/ARB: Yes [ x  ]                                      No  [    ]                                     If No, reason:  3.Statin:   Yes [ x  ]                  No  [   ]                  If No, reason:  4.Ecasa:  Yes  [ x  ]                  No   [   ]                  If No, reason:  Follow Up Appointments: Follow-up Information    Follow up with Grace Isaac, MD On 05/06/2015.   Specialty:  Cardiothoracic Surgery   Why:  PA/LAT CXR to be taken (at [facility] which is in the same building as Dr. Everrett Coombe office) on 01/05/2017at 1:45 pm; Appointment time is at 2:30 pm   Contact information:   [place] [address] [place] [address] [phone]       Follow up with Charolette Forward, MD.   Specialty:  Cardiology   Why:  Call for a follow up appointment for 2 weeks   Contact information:   [address] [place] [address] [phone]       Signed: Lars Pinks MPA-C 03/26/2015, 7:58 AM

## 2015-03-24 NOTE — Clinical Social Work Note (Signed)
Clinical Social Work Assessment  Patient Details  Name: [name] MRN: [id number] Date of Birth: [birth year]  Date of referral:  03/24/15               Reason for consult:  Discharge Planning                Permission sought to share information with:  Facility Art therapist granted to share information::  Yes, Verbal Permission Granted  Name::        Agency::  SNFs  Relationship::     Contact Information:     Housing/Transportation Living arrangements for the past 2 months:  Single Family Home Source of Information:  Patient Patient Interpreter Needed:  None Criminal Activity/Legal Involvement Pertinent to Current Situation/Hospitalization:  No - Comment as needed Significant Relationships:  Other Family Members Lives with:  Self Do you feel safe going back to the place where you live?  Yes Need for family participation in patient care:  Yes (Comment)  Care giving concerns:  Patient agrees with plan for SNF as he lives alone.   Social Worker assessment / plan:  CSW met with patient at bedside to complete assessment. Patient shares that he has been to [facility] in the past for rehab but would prefer [facility] at discharge. CSW explained SNF search/placement process and answered the patient's questions. Patient was very concerned about cost of his hospital stay and SNF stay. CSW explained Medicare coverage for SNF. Patient will plan to return home after rehab where he lives alone. CSW will followup with bed offers.  Employment status:  Retired Forensic scientist:  Medicare PT Recommendations:  [place] / Referral to community resources:  [place]  Patient/Family's Response to care:  Patient expresses gratitude for the care he has received at [facility].  Patient/Family's Understanding of and Emotional Response to Diagnosis, Current Treatment, and Prognosis:  Patient appears to have good understanding  of reason for admission and understands his post DC needs.   Emotional Assessment Appearance:  Appears stated age Attitude/Demeanor/Rapport:    Affect (typically observed):  Accepting, Appropriate, Calm, Pleasant Orientation:  Oriented to Self, Oriented to Place, Oriented to  Time, Oriented to Situation Alcohol / Substance use:  Tobacco Use (Quit in 1960) Psych involvement (Current and /or in the community):  No (Comment)  Discharge Needs  Concerns to be addressed:  Discharge Planning Concerns Readmission within the last 30 days:  No Current discharge risk:  Chronically ill, Lack of support system, Lives alone Barriers to Discharge:  Continued Medical Work up   [employer] MSW, [place], [place], [phone]

## 2015-03-24 NOTE — Progress Notes (Signed)
CARDIAC REHAB PHASE I   PRE:  Rate/Rhythm: 54 SR  BP:  Sitting: 98/71        SaO2: 94 RA  MODE:  Ambulation: 550 ft   POST:  Rate/Rhythm: 76 SR  BP:  Sitting: 118/61         SaO2: 100 RA  Pt stood with minimal assistance, did need reminder of sternal precautions. Pt ambulated 550 ft on RA, rolling walker, assist x1, fairly steady gait, tolerated well. Pt denies DOE, dizziness, pain, declined rest stop. [facility] discharge education completed. Reviewed IS, sternal precautions, activity progression, exercise, heart healthy diet, sodium restrictions, s/s heart failure, daily weights and phase 2 cardiac rehab. Pt verbalized understanding, pt very talkative but receptive to education. Pt agrees to phase 2 cardiac rehab referral, will send to [facility] per pt request. Pt to side of bed per pt request, awaiting EPW removal. Will follow if pt does not discharge tomorrow.  [id number]  Lenna Sciara, RN, BSN 03/24/2015 12:05 PM

## 2015-03-24 NOTE — Progress Notes (Signed)
RT set up patient's CPAP machine and filled with sterile water. RT bled in 2L O2. Patient stated he would put cpap on when he was ready for bed. RT notified patient to have nurse call if assistance is needed.

## 2015-03-24 NOTE — Discharge Instructions (Signed)
Aortic Valve Replacement, Care After °Refer to this sheet in the next few weeks. These instructions provide you with information on caring for yourself after your procedure. Your health care provider may also give you specific instructions. Your treatment has been planned according to current medical practices, but problems sometimes occur. Call your health care provider if you have any problems or questions after your procedure. °HOME CARE INSTRUCTIONS  °· Take medicines only as directed by your health care provider. °· If your health care provider has prescribed elastic stockings, wear them as directed. °· Take frequent naps or rest often throughout the day. °· Avoid lifting over 10 lbs (4.5 kg) or pushing or pulling things with your arms for 6-8 weeks or as directed by your health care provider. °· Avoid driving or airplane travel for 4-6 weeks after surgery or as directed by your health care provider. If you are riding in a car for an extended period, stop every 1-2 hours to stretch your legs. Keep a record of your medicines and medical history with you when traveling. °· Do not drive or operate heavy machinery while taking pain medicine. (narcotics). °· Do not cross your legs. °· Do not use any tobacco products including cigarettes, chewing tobacco, or electronic cigarettes. If you need help quitting, ask your health care provider. °· Do not take baths, swim, or use a hot tub until your health care provider approves. Take showers once your health care provider approves. Pat incisions dry. Do not rub incisions with a washcloth or towel. °· Avoid climbing stairs and using the handrail to pull yourself up for the first 2-3 weeks after surgery. °· Return to work as directed by your health care provider. °· Drink enough fluid to keep your urine clear or pale yellow. °· Do not strain to have a bowel movement. Eat high-fiber foods if you become constipated. You may also take a medicine to help you have a bowel  movement (laxative) as directed by your health care provider. °· Resume sexual activity as directed by your health care provider. Men should not use medicines for erectile dysfunction until their doctor says it is okay. °· If you had a certain type of heart condition in the past, you may need to take antibiotic medicine before having dental work or surgery. Let your dentist and health care providers know if you had one or more of the following: °¨ Previous endocarditis. °¨ An artificial (prosthetic) heart valve. °¨ Congenital heart disease. °SEEK MEDICAL CARE IF: °· You develop a skin rash.   °· You experience sudden changes in your weight. °· You have a fever. °SEEK IMMEDIATE MEDICAL CARE IF:  °· You develop chest pain that is not coming from your incision. °· You have drainage (pus), redness, swelling, or pain at your incision site.   °· You develop shortness of breath or have difficulty breathing.   °· You have increased bleeding from your incision site.   °· You develop light-headedness.   °MAKE SURE YOU:  °· Understand these directions. °· Will watch your condition. °· Will get help right away if you are not doing well or get worse. °  °This information is not intended to replace advice given to you by your health care provider. Make sure you discuss any questions you have with your health care provider. °  °Document Released: 11/03/2004 Document Revised: 05/08/2014 Document Reviewed: 01/30/2012 °Elsevier Interactive Patient Education ©2016 Elsevier Inc. ° °

## 2015-03-24 NOTE — Progress Notes (Signed)
Advanced Heart Failure Rounding Note   Subjective:   Yesterday lisinopril switched to 2.5 mg at bed time.  Weight down 3 pounds.   Denies SOB/Orthopnea.  Creatinine 0.83.   Objective:   Weight Range:  Vital Signs:   Temp:  [97.8 F (36.6 C)-98.8 F (37.1 C)] 98.8 F (37.1 C) (11/23 0439) Pulse Rate:  [70-82] 72 (11/23 0900) Resp:  [16-22] 18 (11/23 0900) BP: (97-125)/(55-92) 113/60 mmHg (11/23 0439) SpO2:  [92 %-100 %] 92 % (11/23 0900) Weight:  [160 lb 7.9 oz (72.8 kg)] 160 lb 7.9 oz (72.8 kg) (11/23 0439) Last BM Date: 03/21/15  Weight change: Filed Weights   03/22/15 0500 03/23/15 0600 03/24/15 0439  Weight: 163 lb 12.8 oz (74.3 kg) 163 lb 9.6 oz (74.208 kg) 160 lb 7.9 oz (72.8 kg)    Intake/Output:   Intake/Output Summary (Last 24 hours) at 03/24/15 0912 Last data filed at 03/23/15 1407  Gross per 24 hour  Intake    360 ml  Output    300 ml  Net     60 ml     Physical Exam: General:  Well appearing. No resp difficulty. Sitting in the chair.  HEENT: normal Neck: supple. JVP flat.  Carotids 2+ bilat; no bruits. No lymphadenopathy or thryomegaly appreciated. Cor: PMI nondisplaced. Regular rate & rhythm. No rubs, gallops or murmurs. Sternal incision approximated. Pacing wires in place.  Lungs: clear Abdomen: soft, nontender, nondistended. No hepatosplenomegaly. No bruits or masses. Good bowel sounds. Extremities: no cyanosis, clubbing, rash, edema Neuro: alert & orientedx3, cranial nerves grossly intact. moves all 4 extremities w/o difficulty. Affect pleasant  Telemetry: A paced 80s   Labs: Basic Metabolic Panel:  Recent Labs Lab 03/17/15 2315  03/18/15 0415 03/18/15 1530 03/19/15 0330 03/20/15 0357 03/21/15 0319 03/23/15 0241 03/24/15 0230  NA  --   < > 132* 132* 129* 127* 128* 130* 130*  K  --   < > 4.5 5.0 4.6 4.7 3.9 3.9 3.8  CL  --   < > 105 98* 99* 98* 97* 98* 96*  CO2  --   < > 21*  --  24 25 27 28 28   GLUCOSE  --   < > 171* 217* 112*  92 121* 129* 122*  BUN  --   < > 20 21* 22* 25* 23* 16 14  CREATININE 1.06  < > 1.04 1.01  0.90 0.99 0.86 0.82 0.87 0.83  CALCIUM  --   < > 8.0*  --  8.1* 8.2* 7.9* 8.1* 8.2*  MG 2.8*  --  2.5* 2.3  --   --   --   --   --   < > = values in this interval not displayed.  Liver Function Tests:  Recent Labs Lab 03/23/15 0241  AST 43*  ALT 63  ALKPHOS 119  BILITOT 0.5  PROT 4.6*  ALBUMIN 2.4*   No results for input(s): LIPASE, AMYLASE in the last 168 hours. No results for input(s): AMMONIA in the last 168 hours.  CBC:  Recent Labs Lab 03/20/15 0357 03/21/15 0319 03/22/15 0217 03/23/15 0241 03/24/15 0230  WBC 10.6* 6.6 5.3 5.9 6.7  HGB 8.0* 7.9* 7.8* 8.1* 7.7*  HCT 23.6* 23.5* 22.5* 24.2* 23.5*  MCV 89.7 89.4 90.0 90.6 92.2  PLT 67* 81* 106* 127* 155    Cardiac Enzymes: No results for input(s): CKTOTAL, CKMB, CKMBINDEX, TROPONINI in the last 168 hours.  BNP: BNP (last 3 results)  Recent Labs  07/10/14 0150 07/10/14 0925  BNP 619.0* 758.2*    ProBNP (last 3 results) No results for input(s): PROBNP in the last 8760 hours.    Other results:  Imaging: Dg Chest 2 View  03/23/2015  CLINICAL DATA:  Aortic valve replacement. Shortness of breath and weakness EXAM: CHEST  2 VIEW COMPARISON:  03/20/2015 FINDINGS: Small to moderate layering bilateral pleural effusion, with differential appearance likely related to inferior flow. There is associated atelectasis, at least segmental. Grossly stable cardiopericardial size and upper mediastinal contours in this patient with aortic valve replacement and left atrial exclusion. In the lateral projection there are gas bubbles over the mediastinum which likely reflects pneumomediastinum. No apical pneumothorax. Background COPD with hyperinflation. IMPRESSION: 1. Bibasilar atelectasis with layering pleural effusions. 2. Probable small residual pneumomediastinum. No apical pneumothorax. 3. Stable postoperative cardiopericardial size.  Electronically Signed   By: Monte Fantasia M.D.   On: 03/23/2015 07:27     Medications:     Scheduled Medications: . ALPRAZolam  0.25 mg Oral QHS  . amiodarone  200 mg Oral Daily  . aspirin  81 mg Oral Daily  . atorvastatin  20 mg Oral q1800  . carvedilol  6.25 mg Oral BID WC  . docusate sodium  200 mg Oral Daily  . enoxaparin (LOVENOX) injection  30 mg Subcutaneous Q24H  . ferrous gluconate  324 mg Oral BID WC  . folic acid  1 mg Oral Daily  . furosemide  40 mg Oral Daily  . lisinopril  2.5 mg Oral QHS  . mometasone-formoterol  2 puff Inhalation BID  . pantoprazole  40 mg Oral QAC breakfast  . potassium chloride  20 mEq Oral Once  . sodium chloride  3 mL Intravenous Q12H  . tiotropium  18 mcg Inhalation Daily    Infusions:    PRN Medications: sodium chloride, bisacodyl **OR** bisacodyl, ondansetron **OR** ondansetron (ZOFRAN) IV, oxyCODONE, sodium chloride, traMADol   Assessment/Plan   Admitted for aortic root replacrment.   1. S/P  BENTALL with bioprosthetic aortic valve, clipping L atrial appendage. Stable off dopamine.  Continue 81 mg aspirin daily.   2. Anemia: Expected post operative blood loss. Hgb 7.8>8.1>7.7  Follow CBC. No evidence of bleeding.  3. Chronic Systolic HF:  NICM. CMRI EF 51% (9/16, improved). Post-op echo showed EF about 50%, well-seated bioprosthetic aortic valve.  - Coreg restarted.  - Continue lisinopril 2.5 mg at bed time. Would not up titrate.  - Lasix 40 mg daily added. - Restart spironolactone 12.5 in am. 4. PAF: Prior to admit was on amio 200 mg daily + eliquis 5 mg twice daily.  He is in NSR.  - On amiodarone 200 mg daily.   5. COPD  6. Hyperlipidemia: on statin  7. Hyponatremia: Sodium 130    Length of Stay: [place] NP-C  03/24/2015, 9:12 AM  Advanced Heart Failure Team Pager [phone] (M-F; 7a - 4p)  Please contact [facility] for night-coverage after hours (4p -7a ) and weekends on [email or web address]  Patient seen with  NP, agree with the above note.  Gradually restarting his home meds.  BP stable, not lightheaded.  No dyspnea.   He is in NSR.  We have discussed anticoagulation: he has PAF and has been on Eliquis. He has now had bioprosthetic valve with LA appendage closure. There is not much data on stroke risk after surgical LA appendage closure. If patients are anticoagulation candidates, I have typically continued anticoagulation given lack of data. However, Mr  Gegg makes it clear that he will not take coumadin. Eliquis use is questionable with a bioprosthetic aortic valve. At this point, his preference is to take aspirin only, so for now that will be the plan going forward. Will reassess with him prior to discharge.  [name] 03/24/2015 12:33 PM

## 2015-03-25 LAB — CBC
HCT: 23.9 % — ABNORMAL LOW (ref 39.0–52.0)
HEMOGLOBIN: 7.9 g/dL — AB (ref 13.0–17.0)
MCH: 30.3 pg (ref 26.0–34.0)
MCHC: 33.1 g/dL (ref 30.0–36.0)
MCV: 91.6 fL (ref 78.0–100.0)
Platelets: 200 10*3/uL (ref 150–400)
RBC: 2.61 MIL/uL — ABNORMAL LOW (ref 4.22–5.81)
RDW: 14 % (ref 11.5–15.5)
WBC: 6.8 10*3/uL (ref 4.0–10.5)

## 2015-03-25 LAB — BASIC METABOLIC PANEL
ANION GAP: 6 (ref 5–15)
BUN: 11 mg/dL (ref 6–20)
CALCIUM: 8.1 mg/dL — AB (ref 8.9–10.3)
CO2: 26 mmol/L (ref 22–32)
Chloride: 98 mmol/L — ABNORMAL LOW (ref 101–111)
Creatinine, Ser: 0.76 mg/dL (ref 0.61–1.24)
GLUCOSE: 117 mg/dL — AB (ref 65–99)
Potassium: 3.9 mmol/L (ref 3.5–5.1)
Sodium: 130 mmol/L — ABNORMAL LOW (ref 135–145)

## 2015-03-25 MED ORDER — LISINOPRIL 5 MG PO TABS
5.0000 mg | ORAL_TABLET | Freq: Every day | ORAL | Status: DC
  Filled 2015-03-25: qty 1

## 2015-03-25 MED ORDER — OXYCODONE HCL 5 MG PO TABS: 5.0000 mg | ORAL_TABLET | ORAL | Status: DC | PRN

## 2015-03-25 MED ORDER — POLYETHYLENE GLYCOL 3350 17 G PO PACK
17.0000 g | PACK | Freq: Every day | ORAL | Status: DC
  Administered 2015-03-25 – 2015-03-26 (×2): 17 g via ORAL
  Filled 2015-03-25 (×2): qty 1

## 2015-03-25 NOTE — Progress Notes (Addendum)
      [address]       [place] [address]             [phone]      8 Days Post-Op Procedure(s) (LRB): BIOLOGIC BENTALL PROCEDURE WITH 27 TISSUE VALVE AND 30 VALSALVA GRAFT (N/A) CLIPPING OF LEFT ATRIAL APPENDAGE (N/A) TRANSESOPHAGEAL ECHOCARDIOGRAM (TEE) (N/A) LEFT BIPOLAR MAZE (N/A)   Subjective:  [name] has no new complaints.  Continues to be sore.  Last BM 1 week ago  Objective: Vital signs in last 24 hours: Temp:  [97.8 F (36.6 C)-97.9 F (36.6 C)] 97.9 F (36.6 C) (11/24 0422) Pulse Rate:  [66-76] 73 (11/24 0422) Cardiac Rhythm:  [-] Heart block;Bundle branch block (11/24 0700) Resp:  [18] 18 (11/24 0422) BP: (98-118)/(48-71) 111/59 mmHg (11/24 0422) SpO2:  [92 %-95 %] 93 % (11/24 0422) Weight:  [172 lb 6.4 oz (78.2 kg)] 172 lb 6.4 oz (78.2 kg) (11/24 0422)  Intake/Output from previous day: 11/23 0701 - 11/24 0700 In: 480 [P.O.:480] Out: -   General appearance: alert, cooperative and no distress Heart: regular rate and rhythm Lungs: clear to auscultation bilaterally Abdomen: soft, non-tender; bowel sounds normal; no masses,  no organomegaly Extremities: edema trace Wound: clean and d ry  Lab Results:  Recent Labs  03/24/15 0230 03/25/15 0417  WBC 6.7 6.8  HGB 7.7* 7.9*  HCT 23.5* 23.9*  PLT 155 200   BMET:  Recent Labs  03/24/15 0230 03/25/15 0417  NA 130* 130*  K 3.8 3.9  CL 96* 98*  CO2 28 26  GLUCOSE 122* 117*  BUN 14 11  CREATININE 0.83 0.76  CALCIUM 8.2* 8.1*    PT/INR: No results for input(s): LABPROT, INR in the last 72 hours. ABG    Component Value Date/Time   PHART 7.281* 03/18/2015 0144   HCO3 21.2 03/18/2015 0144   TCO2 18 03/18/2015 1530   ACIDBASEDEF 5.0* 03/18/2015 0144   O2SAT 98.0 03/18/2015 0144   CBG (last 3)  No results for input(s): GLUCAP in the last 72 hours.  Assessment/Plan: S/P Procedure(s) (LRB): BIOLOGIC BENTALL PROCEDURE WITH 27 TISSUE VALVE AND 30 VALSALVA GRAFT  (N/A) CLIPPING OF LEFT ATRIAL APPENDAGE (N/A) TRANSESOPHAGEAL ECHOCARDIOGRAM (TEE) (N/A) LEFT BIPOLAR MAZE (N/A)  1. CV- hemodynamically stable, continue Amiodarone, Coreg, Lisinopril 2. Pulm- continue pulm toilet, not acute issues 3. Renal- creatinine remains stable, per HF no diuretic indicated at this time 4. Acute blood loss anemia- continue Iron 5. Deconditioning- SNF placement being arranged 6. Dispo- patient remains medically stable, continue iron for anemia, no coumadin as patient refuses, to SNF once bed available    LOS: 8 days    [name] 03/25/2015  Feels well today Waiting for snf placement  Wounds intact I have seen and examined [name] and agree with the above assessment  and plan.  Grace Isaac MD Beeper [phone] Office [phone] 03/25/2015 10:04 AM

## 2015-03-25 NOTE — Progress Notes (Signed)
Patient ID: [name], male   DOB: Jan 28, 1941, 74 y.o.   MRN: [id number]    Advanced Heart Failure Rounding Note   Subjective:   Doing well today, now complaints.    Denies SOB/Orthopnea.  Objective:   Weight Range:  Vital Signs:   Temp:  [97.8 F (36.6 C)-97.9 F (36.6 C)] 97.9 F (36.6 C) (11/24 0422) Pulse Rate:  [66-76] 73 (11/24 0422) Resp:  [18] 18 (11/24 0422) BP: (98-118)/(48-71) 111/59 mmHg (11/24 0422) SpO2:  [92 %-95 %] 92 % (11/24 0922) Weight:  [172 lb 6.4 oz (78.2 kg)] 172 lb 6.4 oz (78.2 kg) (11/24 0422) Last BM Date: 03/22/15  Weight change: Filed Weights   03/23/15 0600 03/24/15 0439 03/25/15 0422  Weight: 163 lb 9.6 oz (74.208 kg) 160 lb 7.9 oz (72.8 kg) 172 lb 6.4 oz (78.2 kg)    Intake/Output:   Intake/Output Summary (Last 24 hours) at 03/25/15 0941 Last data filed at 03/24/15 2114  Gross per 24 hour  Intake    240 ml  Output      0 ml  Net    240 ml     Physical Exam: General:  Well appearing. No resp difficulty. Sitting in the chair.  HEENT: normal Neck: supple. JVP flat.  Carotids 2+ bilat; no bruits. No lymphadenopathy or thryomegaly appreciated. Cor: PMI nondisplaced. Regular rate & rhythm. No rubs, gallops or murmurs. Sternal incision approximated.  Lungs: clear Abdomen: soft, nontender, nondistended. No hepatosplenomegaly. No bruits or masses. Good bowel sounds. Extremities: no cyanosis, clubbing, rash, edema Neuro: alert & orientedx3, cranial nerves grossly intact. moves all 4 extremities w/o difficulty. Affect pleasant  Telemetry: NSR  Labs: Basic Metabolic Panel:  Recent Labs Lab 03/18/15 1530  03/20/15 0357 03/21/15 0319 03/23/15 0241 03/24/15 0230 03/25/15 0417  NA 132*  < > 127* 128* 130* 130* 130*  K 5.0  < > 4.7 3.9 3.9 3.8 3.9  CL 98*  < > 98* 97* 98* 96* 98*  CO2  --   < > 25 27 28 28 26   GLUCOSE 217*  < > 92 121* 129* 122* 117*  BUN 21*  < > 25* 23* 16 14 11   CREATININE 1.01  0.90  < > 0.86 0.82 0.87  0.83 0.76  CALCIUM  --   < > 8.2* 7.9* 8.1* 8.2* 8.1*  MG 2.3  --   --   --   --   --   --   < > = values in this interval not displayed.  Liver Function Tests:  Recent Labs Lab 03/23/15 0241  AST 43*  ALT 63  ALKPHOS 119  BILITOT 0.5  PROT 4.6*  ALBUMIN 2.4*   No results for input(s): LIPASE, AMYLASE in the last 168 hours. No results for input(s): AMMONIA in the last 168 hours.  CBC:  Recent Labs Lab 03/21/15 0319 03/22/15 0217 03/23/15 0241 03/24/15 0230 03/25/15 0417  WBC 6.6 5.3 5.9 6.7 6.8  HGB 7.9* 7.8* 8.1* 7.7* 7.9*  HCT 23.5* 22.5* 24.2* 23.5* 23.9*  MCV 89.4 90.0 90.6 92.2 91.6  PLT 81* 106* 127* 155 200    Cardiac Enzymes: No results for input(s): CKTOTAL, CKMB, CKMBINDEX, TROPONINI in the last 168 hours.  BNP: BNP (last 3 results)  Recent Labs  07/10/14 0150 07/10/14 0925  BNP 619.0* 758.2*    ProBNP (last 3 results) No results for input(s): PROBNP in the last 8760 hours.    Other results:  Imaging: No results found.  Medications:     Scheduled Medications: . ALPRAZolam  0.25 mg Oral QHS  . amiodarone  200 mg Oral Daily  . aspirin  81 mg Oral Daily  . atorvastatin  20 mg Oral q1800  . carvedilol  6.25 mg Oral BID WC  . docusate sodium  200 mg Oral Daily  . enoxaparin (LOVENOX) injection  30 mg Subcutaneous Q24H  . ferrous gluconate  324 mg Oral BID WC  . folic acid  1 mg Oral Daily  . lisinopril  5 mg Oral QHS  . mometasone-formoterol  2 puff Inhalation BID  . pantoprazole  40 mg Oral QAC breakfast  . polyethylene glycol  17 g Oral Daily  . sodium chloride  3 mL Intravenous Q12H  . spironolactone  12.5 mg Oral Daily  . tiotropium  18 mcg Inhalation Daily    Infusions:    PRN Medications: sodium chloride, bisacodyl **OR** bisacodyl, ondansetron **OR** ondansetron (ZOFRAN) IV, oxyCODONE, sodium chloride, traMADol   Assessment/Plan    1. S/P  BENTALL with bioprosthetic aortic valve, clipping L atrial appendage. Echo  showed stable valve and ascending aorta. 2. Anemia: Expected post operative blood loss.  3. Chronic Systolic HF:  NICM. CMRI EF 51% (9/16, improved). Post-op echo showed EF about 50%, well-seated bioprosthetic aortic valve.  - Coreg restarted.  - Can increase lisinopril to 5 mg daily.   - Restarted spironolactone 12.5 mg daily. - Does not need Lasix.  4. PAF:   He is in NSR. He has PAF and has been on Eliquis. He has now had bioprosthetic valve with LA appendage closure. There is not much data on stroke risk after surgical LA appendage closure. If patients are anticoagulation candidates, I have typically continued anticoagulation given lack of data. However, Mr Ries makes it clear that he will not take coumadin. Eliquis use is questionable with a bioprosthetic aortic valve. At this point, his preference is to take aspirin only, so for now that will be the plan going forward.  - Continue amiodarone 200 mg daily.   5. COPD  6. Hyperlipidemia: on statin   [name] 03/25/2015 9:41 AM

## 2015-03-25 NOTE — Progress Notes (Signed)
RT set up patients CPAP. Patient stated he would put it on when he is ready for bed.

## 2015-03-26 DIAGNOSIS — J438 Other emphysema: Secondary | ICD-10-CM | POA: Diagnosis not present

## 2015-03-26 DIAGNOSIS — D62 Acute posthemorrhagic anemia: Secondary | ICD-10-CM | POA: Diagnosis not present

## 2015-03-26 DIAGNOSIS — E46 Unspecified protein-calorie malnutrition: Secondary | ICD-10-CM | POA: Diagnosis not present

## 2015-03-26 DIAGNOSIS — R079 Chest pain, unspecified: Secondary | ICD-10-CM | POA: Diagnosis not present

## 2015-03-26 DIAGNOSIS — Z48812 Encounter for surgical aftercare following surgery on the circulatory system: Secondary | ICD-10-CM | POA: Diagnosis not present

## 2015-03-26 DIAGNOSIS — R5381 Other malaise: Secondary | ICD-10-CM | POA: Diagnosis not present

## 2015-03-26 DIAGNOSIS — E43 Unspecified severe protein-calorie malnutrition: Secondary | ICD-10-CM | POA: Diagnosis not present

## 2015-03-26 DIAGNOSIS — R278 Other lack of coordination: Secondary | ICD-10-CM | POA: Diagnosis not present

## 2015-03-26 DIAGNOSIS — F411 Generalized anxiety disorder: Secondary | ICD-10-CM | POA: Diagnosis not present

## 2015-03-26 DIAGNOSIS — I2510 Atherosclerotic heart disease of native coronary artery without angina pectoris: Secondary | ICD-10-CM | POA: Diagnosis not present

## 2015-03-26 DIAGNOSIS — I351 Nonrheumatic aortic (valve) insufficiency: Secondary | ICD-10-CM | POA: Diagnosis not present

## 2015-03-26 DIAGNOSIS — I5022 Chronic systolic (congestive) heart failure: Secondary | ICD-10-CM | POA: Diagnosis not present

## 2015-03-26 DIAGNOSIS — R2689 Other abnormalities of gait and mobility: Secondary | ICD-10-CM | POA: Diagnosis not present

## 2015-03-26 DIAGNOSIS — M6281 Muscle weakness (generalized): Secondary | ICD-10-CM | POA: Diagnosis not present

## 2015-03-26 DIAGNOSIS — J449 Chronic obstructive pulmonary disease, unspecified: Secondary | ICD-10-CM | POA: Diagnosis not present

## 2015-03-26 DIAGNOSIS — E871 Hypo-osmolality and hyponatremia: Secondary | ICD-10-CM | POA: Diagnosis not present

## 2015-03-26 DIAGNOSIS — I10 Essential (primary) hypertension: Secondary | ICD-10-CM | POA: Diagnosis not present

## 2015-03-26 DIAGNOSIS — K5900 Constipation, unspecified: Secondary | ICD-10-CM | POA: Diagnosis not present

## 2015-03-26 DIAGNOSIS — I480 Paroxysmal atrial fibrillation: Secondary | ICD-10-CM | POA: Diagnosis not present

## 2015-03-26 DIAGNOSIS — I482 Chronic atrial fibrillation: Secondary | ICD-10-CM | POA: Diagnosis not present

## 2015-03-26 LAB — BASIC METABOLIC PANEL
ANION GAP: 5 (ref 5–15)
BUN: 12 mg/dL (ref 6–20)
CHLORIDE: 99 mmol/L — AB (ref 101–111)
CO2: 26 mmol/L (ref 22–32)
Calcium: 8 mg/dL — ABNORMAL LOW (ref 8.9–10.3)
Creatinine, Ser: 0.81 mg/dL (ref 0.61–1.24)
GFR calc Af Amer: >60 mL/min (ref >60)
GLUCOSE: 111 mg/dL — AB (ref 65–99)
POTASSIUM: 3.9 mmol/L (ref 3.5–5.1)
Sodium: 130 mmol/L — ABNORMAL LOW (ref 135–145)

## 2015-03-26 MED ORDER — LISINOPRIL 2.5 MG PO TABS: 2.5000 mg | ORAL_TABLET | Freq: Every day | ORAL | Status: DC

## 2015-03-26 MED ORDER — ASPIRIN 81 MG PO CHEW: 81.0000 mg | CHEWABLE_TABLET | Freq: Every day | ORAL | Status: AC

## 2015-03-26 MED ORDER — FOLIC ACID 1 MG PO TABS: 1.0000 mg | ORAL_TABLET | Freq: Every day | ORAL | Status: DC

## 2015-03-26 MED ORDER — FLEET ENEMA 7-19 GM/118ML RE ENEM
1.0000 | ENEMA | Freq: Once | RECTAL | Status: AC
  Administered 2015-03-26: 1 via RECTAL
  Filled 2015-03-26: qty 1

## 2015-03-26 MED ORDER — FERROUS GLUCONATE 324 (38 FE) MG PO TABS: 324.0000 mg | ORAL_TABLET | Freq: Every day | ORAL | Status: DC

## 2015-03-26 MED ORDER — TRAMADOL HCL 50 MG PO TABS: 50.0000 mg | ORAL_TABLET | Freq: Four times a day (QID) | ORAL | Status: DC | PRN

## 2015-03-26 NOTE — Progress Notes (Signed)
Patient ID: HAWARD BIESINGER, male   DOB: 04-21-1941, 74 y.o.   MRN: [id number]    Advanced Heart Failure Rounding Note   Subjective:   Constipated today and uncomfortable.    Soft BP at times, cut back on lisinopril and spironolactone doses.    Denies SOB/Orthopnea.  Objective:   Weight Range:  Vital Signs:   Temp:  [97.5 F (36.4 C)-98.4 F (36.9 C)] 98.2 F (36.8 C) (11/25 0551) Pulse Rate:  [63-70] 70 (11/25 0551) Resp:  [16-18] 16 (11/25 0551) BP: (96-123)/(47-71) 123/71 mmHg (11/25 0551) SpO2:  [91 %-96 %] 95 % (11/25 0551) Weight:  [164 lb (74.39 kg)] 164 lb (74.39 kg) (11/25 0551) Last BM Date: 03/22/15  Weight change: Filed Weights   03/24/15 0439 03/25/15 0422 03/26/15 0551  Weight: 160 lb 7.9 oz (72.8 kg) 172 lb 6.4 oz (78.2 kg) 164 lb (74.39 kg)    Intake/Output:   Intake/Output Summary (Last 24 hours) at 03/26/15 1005 Last data filed at 03/26/15 0122  Gross per 24 hour  Intake    720 ml  Output    400 ml  Net    320 ml     Physical Exam: General:  Uncomfortable (constipated) HEENT: normal Neck: supple. JVP flat.  Carotids 2+ bilat; no bruits. No lymphadenopathy or thryomegaly appreciated. Cor: PMI nondisplaced. Regular rate & rhythm. No rubs, gallops or murmurs. Sternal incision approximated.  Lungs: clear Abdomen: soft, nontender, nondistended. No hepatosplenomegaly. No bruits or masses. Good bowel sounds. Extremities: no cyanosis, clubbing, rash, edema Neuro: alert & orientedx3, cranial nerves grossly intact. moves all 4 extremities w/o difficulty. Affect pleasant  Telemetry: NSR  Labs: Basic Metabolic Panel:  Recent Labs Lab 03/21/15 0319 03/23/15 0241 03/24/15 0230 03/25/15 0417 03/26/15 0234  NA 128* 130* 130* 130* 130*  K 3.9 3.9 3.8 3.9 3.9  CL 97* 98* 96* 98* 99*  CO2 27 28 28 26 26   GLUCOSE 121* 129* 122* 117* 111*  BUN 23* 16 14 11 12   CREATININE 0.82 0.87 0.83 0.76 0.81  CALCIUM 7.9* 8.1* 8.2* 8.1* 8.0*    Liver  Function Tests:  Recent Labs Lab 03/23/15 0241  AST 43*  ALT 63  ALKPHOS 119  BILITOT 0.5  PROT 4.6*  ALBUMIN 2.4*   No results for input(s): LIPASE, AMYLASE in the last 168 hours. No results for input(s): AMMONIA in the last 168 hours.  CBC:  Recent Labs Lab 03/21/15 0319 03/22/15 0217 03/23/15 0241 03/24/15 0230 03/25/15 0417  WBC 6.6 5.3 5.9 6.7 6.8  HGB 7.9* 7.8* 8.1* 7.7* 7.9*  HCT 23.5* 22.5* 24.2* 23.5* 23.9*  MCV 89.4 90.0 90.6 92.2 91.6  PLT 81* 106* 127* 155 200    Cardiac Enzymes: No results for input(s): CKTOTAL, CKMB, CKMBINDEX, TROPONINI in the last 168 hours.  BNP: BNP (last 3 results)  Recent Labs  07/10/14 0150 07/10/14 0925  BNP 619.0* 758.2*    ProBNP (last 3 results) No results for input(s): PROBNP in the last 8760 hours.    Other results:  Imaging: No results found.   Medications:     Scheduled Medications: . ALPRAZolam  0.25 mg Oral QHS  . amiodarone  200 mg Oral Daily  . aspirin  81 mg Oral Daily  . atorvastatin  20 mg Oral q1800  . carvedilol  6.25 mg Oral BID WC  . docusate sodium  200 mg Oral Daily  . enoxaparin (LOVENOX) injection  30 mg Subcutaneous Q24H  . ferrous gluconate  324 mg  Oral BID WC  . folic acid  1 mg Oral Daily  . lisinopril  2.5 mg Oral QHS  . mometasone-formoterol  2 puff Inhalation BID  . pantoprazole  40 mg Oral QAC breakfast  . polyethylene glycol  17 g Oral Daily  . sodium chloride  3 mL Intravenous Q12H  . sodium phosphate  1 enema Rectal Once  . spironolactone  12.5 mg Oral Daily  . tiotropium  18 mcg Inhalation Daily    Infusions:    PRN Medications: sodium chloride, bisacodyl **OR** bisacodyl, ondansetron **OR** ondansetron (ZOFRAN) IV, oxyCODONE, sodium chloride, traMADol   Assessment/Plan    1. S/P  BENTALL with bioprosthetic aortic valve, clipping L atrial appendage. Echo showed stable valve and ascending aorta. 2. Anemia: Expected post operative blood loss.  3. Chronic  Systolic HF:  NICM. CMRI EF 51% (9/16, improved). Post-op echo showed EF about 50%, well-seated bioprosthetic aortic valve.  - Coreg restarted.  - Continue lisinopril at 2.5 mg daily and spironolactone at 12.5 mg daily with soft BP at times.   - Does not need Lasix.  4. PAF:   He is in NSR. He has PAF and has been on Eliquis. He has now had bioprosthetic valve with LA appendage closure. There is not much data on stroke risk after surgical LA appendage closure. If patients are anticoagulation candidates, I have typically continued anticoagulation given lack of data. However, Mr Mow makes it clear that he will not take coumadin. Eliquis use is questionable with a bioprosthetic aortic valve. At this point, his preference is to take aspirin only, so for now that will be the plan going forward.  - Continue amiodarone 200 mg daily.   5. COPD  6. Hyperlipidemia: on statin  7. Constipation: Fleets enema for this morning.   [name] 03/26/2015 10:05 AM

## 2015-03-26 NOTE — Care Management Note (Signed)
Case Management Note Previous CM note initiated by Luz Lex RN CM  Patient Details  Name: [name] MRN: [id number] Date of Birth: April 08, 1941  Subjective/Objective:      Home alone.  Plans for discharge to SNF [facility] for rehab.  PT consult in.  Will place SW consult              Action/Plan:   Expected Discharge Date:     03/26/15             Expected Discharge Plan:  [place]  In-House Referral:  Clinical Social Work  Discharge planning Services  CM Consult  Post Acute Care Choice:    Choice offered to:     DME Arranged:    DME Agency:     HH Arranged:    [place] Agency:     Status of Service:  Completed, signed off  Medicare Important Message Given:  Yes Date Medicare IM Given:    Medicare IM give by:    Date Additional Medicare IM Given:    Additional Medicare Important Message give by:     If discussed at [place] of Stay Meetings, dates discussed:    Additional Comments:  03/26/15- plan for d/c today to [facility]- CSW following for placement needs.  Dawayne Patricia, RN 03/26/2015, 9:55 AM

## 2015-03-26 NOTE — Progress Notes (Signed)
Pt in stable condition. IV taken out and cardiac monitor disconnected. CCMD notified. Breathing treatment medications were given to patient in addition to his belonging, prescription order and the discharge instructions. Pt was discharged to [facility] via EMS on a stretcher. Oren Beckmann, RN

## 2015-03-26 NOTE — Clinical Social Work Note (Signed)
Clinical Social Worker facilitated patient discharge including contacting patient family and facility to confirm patient discharge plans.  Clinical information faxed to facility and family agreeable with plan.  CSW arranged ambulance transport via [facility] to [facility].  RN to call report prior to discharge.  Clinical Social Worker will sign off for now as social work intervention is no longer needed. Please consult us again if new need arises.  Jesse Mosie Angus, LCSW [phone] 

## 2015-03-26 NOTE — Progress Notes (Signed)
Writter spoke with the Rehab nurse Ebony Hail at [facility] and gave a discharge instructions about the pt.........Marland KitchenChrys Ellis

## 2015-03-26 NOTE — Progress Notes (Signed)
Utilization review completed.  

## 2015-03-26 NOTE — Clinical Social Work Placement (Signed)
   CLINICAL SOCIAL WORK PLACEMENT  NOTE  Date:  03/26/2015  Patient Details  Name: [name] MRN: [id number] Date of Birth: 1941/01/16  Clinical Social Work is seeking post-discharge placement for this patient at the [place] level of care (*CSW will initial, date and re-position this form in  chart as items are completed):  Yes   Patient/family provided with [place] Work Department's list of facilities offering this level of care within the geographic area requested by the patient (or if unable, by the patient's family).  Yes   Patient/family informed of their freedom to choose among providers that offer the needed level of care, that participate in Medicare, Medicaid or managed care program needed by the patient, have an available bed and are willing to accept the patient.  Yes   Patient/family informed of [facility]'s ownership interest in [facility] and [facility], as well as of the fact that they are under no obligation to receive care at these facilities.  PASRR submitted to EDS on       PASRR number received on       Existing PASRR number confirmed on 03/24/15     FL2 transmitted to all facilities in geographic area requested by pt/family on 03/24/15     FL2 transmitted to all facilities within larger geographic area on       Patient informed that his/her managed care company has contracts with or will negotiate with certain facilities, including the following:        Yes   Patient/family informed of bed offers received.  Patient chooses bed at [facility]     Physician recommends and patient chooses bed at      Patient to be transferred to [facility] on 03/26/15.  Patient to be transferred to facility by Ambulance     Patient family notified on 03/26/15 of transfer.  Name of family member notified:  Patient to notify family     PHYSICIAN       Additional Comment:   Barbette Or, [place]

## 2015-03-26 NOTE — Progress Notes (Signed)
Physical Therapy Treatment Patient Details Name: [name] MRN: [id number] DOB: 1940/12/27 Today's Date: 03/26/2015    History of Present Illness Mr [name] is a 74 year old with a history of HTN and COPD was admitted in 3/16 with atrial fibrillation/RVR and dyspnea. Echo showed low EF (15-20% range). He had TEE-guided DCCV to NSR and cardiac catheterization. Cath showed no obstructive coronary disease (nonischemic cardiomyopathy). Echo showed dilated aortic root. 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  BIOLOGIC BENTALL PROCEDURE WITH 27 TISSUE VALVE AND 30 VALSALVA GRAFT    PT Comments    Progressing well.  Needs frequent reinforcement of sternal precautions  Follow Up Recommendations  SNF;Supervision/Assistance - 24 hour (pt has no one at home to supervise or assist)     Equipment Recommendations       Recommendations for Other Services       Precautions / Restrictions Precautions Precautions: Fall;Sternal Restrictions Other Position/Activity Restrictions: sternal precautions    Mobility  Bed Mobility Overal bed mobility: Needs Assistance Bed Mobility: Supine to Sit;Sit to Supine     Supine to sit: Min assist Sit to supine: Min guard   General bed mobility comments: min assist to help up into the bed.  Reinforced sternal precautions.  Transfers Overall transfer level: Needs assistance   Transfers: Sit to/from Stand Sit to Stand: Min guard         General transfer comment: cues for sternal precautions  Ambulation/Gait Ambulation/Gait assistance: Min guard Ambulation Distance (Feet): 1000 Feet Assistive device: None Gait Pattern/deviations: Step-through pattern Gait velocity: slower today   General Gait Details: steady gait though still guarded, sats in the low 90's and EHR in the 80's to 90's   Stairs Stairs: Yes Stairs assistance: Min guard Stair Management: One rail  Right;Alternating pattern;Forwards Number of Stairs: 3 General stair comments: safe and guarded with rail  Wheelchair Mobility    Modified Rankin (Stroke Patients Only)       Balance Overall balance assessment: Needs assistance   Sitting balance-Leahy Scale: Fair Sitting balance - Comments: tends to fall posteriorly when scooting on the bed.     Standing balance-Leahy Scale: Fair                      Cognition Arousal/Alertness: Awake/alert Behavior During Therapy: WFL for tasks assessed/performed Overall Cognitive Status: Within Functional Limits for tasks assessed                      Exercises      General Comments General comments (skin integrity, edema, etc.): Continuous need to reinforce sternal precautions.      Pertinent Vitals/Pain Pain Assessment: Faces Faces Pain Scale: Hurts a little bit Pain Location: buttocks sore Pain Descriptors / Indicators: Sore Pain Intervention(s): Monitored during session    Home Living                      Prior Function            PT Goals (current goals can now be found in the care plan section) Acute Rehab PT Goals Patient Stated Goal: to go home after rehab PT Goal Formulation: With patient Time For Goal Achievement: 04/03/15 Potential to Achieve Goals: Good Progress towards PT goals: Progressing toward goals    Frequency  Min 3X/week    PT Plan Current plan remains appropriate    Co-evaluation  End of Session   Activity Tolerance: Patient tolerated treatment well Patient left: in bed;with call bell/phone within reach     Time: [id number] PT Time Calculation (min) (ACUTE ONLY): 26 min  Charges:  $Gait Training: 8-22 mins $Therapeutic Activity: 8-22 mins                    G Codes:      Casidee Jann, Tessie Fass 03/26/2015, 1:44 PM  03/26/2015  Donnella Sham, PT [phone]  (pager)

## 2015-03-26 NOTE — Progress Notes (Signed)
1002- Cardiac Rehab Phase I- No ambulation at this time, patient unable to have a BM and in a lot of discomfort. Pt's RN aware. Sol Passer, MS, ACSM CCEP

## 2015-03-26 NOTE — Progress Notes (Signed)
      [address]       ,[place] [address]             [phone]      8 Days Post-Op Procedure(s) (LRB): BIOLOGIC BENTALL PROCEDURE WITH 27 TISSUE VALVE AND 30 VALSALVA GRAFT (N/A) CLIPPING OF LEFT ATRIAL APPENDAGE (N/A) TRANSESOPHAGEAL ECHOCARDIOGRAM (TEE) (N/A) LEFT BIPOLAR MAZE (N/A)   Subjective: Patient without complaints.   Objective: Vital signs in last 24 hours: Temp:  [97.5 F (36.4 C)-98.4 F (36.9 C)] 98.2 F (36.8 C) (11/25 0551) Pulse Rate:  [63-70] 70 (11/25 0551) Cardiac Rhythm:  [-] Heart block;Bundle branch block (11/24 1900) Resp:  [16-18] 16 (11/25 0551) BP: (96-123)/(47-71) 123/71 mmHg (11/25 0551) SpO2:  [91 %-96 %] 95 % (11/25 0551) Weight:  [164 lb (74.39 kg)] 164 lb (74.39 kg) (11/25 0551)  Intake/Output from previous day: 11/24 0701 - 11/25 0700 In: 960 [P.O.:960] Out: 400 [Urine:400]  General appearance: alert, cooperative and no distress Heart: regular rate and rhythm Lungs: clear to auscultation bilaterally Abdomen: soft, non-tender; bowel sounds normal; no masses,  no organomegaly Extremities: edema trace Wound: clean and d ry  Lab Results:  Recent Labs  03/24/15 0230 03/25/15 0417  WBC 6.7 6.8  HGB 7.7* 7.9*  HCT 23.5* 23.9*  PLT 155 200   BMET:   Recent Labs  03/25/15 0417 03/26/15 0234  NA 130* 130*  K 3.9 3.9  CL 98* 99*  CO2 26 26  GLUCOSE 117* 111*  BUN 11 12  CREATININE 0.76 0.81  CALCIUM 8.1* 8.0*    PT/INR: No results for input(s): LABPROT, INR in the last 72 hours. ABG    Component Value Date/Time   PHART 7.281* 03/18/2015 0144   HCO3 21.2 03/18/2015 0144   TCO2 18 03/18/2015 1530   ACIDBASEDEF 5.0* 03/18/2015 0144   O2SAT 98.0 03/18/2015 0144   CBG (last 3)  No results for input(s): GLUCAP in the last 72 hours.  Assessment/Plan: S/P Procedure(s) (LRB): BIOLOGIC BENTALL PROCEDURE WITH 27 TISSUE VALVE AND 30 VALSALVA GRAFT (N/A) CLIPPING OF LEFT ATRIAL APPENDAGE  (N/A) TRANSESOPHAGEAL ECHOCARDIOGRAM (TEE) (N/A) LEFT BIPOLAR MAZE (N/A)  1. CV- Remains in SR. Continue Amiodarone, Coreg, Lisinopril. BP more labile (SBP in the 90's-low 100's) so will decrease Lisinopril. 2. Pulm- On room air. Continue pulm toilet 3. Renal- creatinine remains stable, per HF no diuretic indicated at this time 4. Acute blood loss anemia- Continue Iron 5. SNF placement being arranged-surgically stable once bed is available  Tyner Codner M PA-C 03/26/2015

## 2015-03-29 ENCOUNTER — Telehealth (HOSPITAL_COMMUNITY): Payer: Self-pay | Admitting: *Deleted

## 2015-03-29 NOTE — Telephone Encounter (Signed)
Called pt to make hosp follow up appt no answer or voicemail. Will try pt again later

## 2015-03-29 NOTE — Telephone Encounter (Signed)
-----   Message from Shirley Friar, PA-C sent at 03/29/2015 11:53 AM EST -----   Dr Aundra Dubin would like to see this pt in 3 weeks, he was discharged over the weekend.

## 2015-03-30 ENCOUNTER — Non-Acute Institutional Stay (SKILLED_NURSING_FACILITY): Payer: Medicare Other | Admitting: Internal Medicine

## 2015-03-30 DIAGNOSIS — E871 Hypo-osmolality and hyponatremia: Secondary | ICD-10-CM

## 2015-03-30 DIAGNOSIS — K5900 Constipation, unspecified: Secondary | ICD-10-CM | POA: Diagnosis not present

## 2015-03-30 DIAGNOSIS — I2510 Atherosclerotic heart disease of native coronary artery without angina pectoris: Secondary | ICD-10-CM

## 2015-03-30 DIAGNOSIS — E46 Unspecified protein-calorie malnutrition: Secondary | ICD-10-CM | POA: Diagnosis not present

## 2015-03-30 DIAGNOSIS — I10 Essential (primary) hypertension: Secondary | ICD-10-CM

## 2015-03-30 DIAGNOSIS — I480 Paroxysmal atrial fibrillation: Secondary | ICD-10-CM | POA: Diagnosis not present

## 2015-03-30 DIAGNOSIS — I5022 Chronic systolic (congestive) heart failure: Secondary | ICD-10-CM

## 2015-03-30 DIAGNOSIS — R5381 Other malaise: Secondary | ICD-10-CM | POA: Diagnosis not present

## 2015-03-30 DIAGNOSIS — D62 Acute posthemorrhagic anemia: Secondary | ICD-10-CM | POA: Diagnosis not present

## 2015-03-30 DIAGNOSIS — J438 Other emphysema: Secondary | ICD-10-CM | POA: Diagnosis not present

## 2015-03-30 DIAGNOSIS — I351 Nonrheumatic aortic (valve) insufficiency: Secondary | ICD-10-CM | POA: Diagnosis not present

## 2015-03-30 DIAGNOSIS — F411 Generalized anxiety disorder: Secondary | ICD-10-CM

## 2015-03-30 NOTE — Progress Notes (Signed)
Patient ID: [name], male   DOB: [birth year], 74 y.o.   MRN: [id number]     Facility: [facility]    PCP: Charolette Forward, MD  Code Status: full code  No Known Allergies  Chief Complaint  Patient presents with  . New Admit To SNF     HPI:  74 y.o. patient is here for short term rehabilitation post hospital admission from 03/17/15-03/26/15 with severely dilated aortic root, moderate aortic insufficiency and underwent aortic root replacement and reimplantation of coronary arteries, replacement of aortic valve and root with pericardial tissue valve [employer] model 3300TFX 27 mm serial [id number] and 30 mm Valsalva graft and pulmonary vein MAZE procedure with left-sided pulmonary isolation lesions, placement of 35 mm atrial clip by Dr. Servando Snare on 03/17/2015. He has PMH of coronary artery disease cath 2007, hypertension, prediabetes, hypercholesterolemia, degenerative joint disease, COPD, chronic atrial fibrillation. He denies any concerns today.   Review of Systems:  Constitutional: Negative for fever, chills, diaphoresis.  HENT: Negative for headache, congestion, nasal discharge, difficulty swallowing.   Eyes: Negative for eye pain, blurred vision, double vision and discharge.  Respiratory: Negative for cough, shortness of breath and wheezing.   Cardiovascular: Negative for chest pain, palpitations. Positive for leg swelling.  Gastrointestinal: Negative for heartburn, nausea, vomiting, abdominal pain. Last bowel movement on Friday Genitourinary: Negative for dysuria, flank pain.  Musculoskeletal: Negative for back pain, falls. Skin: Negative for itching, rash.  Neurological: Negative for dizziness, tingling, focal weakness Psychiatric/Behavioral: Negative for depression   Past Medical History  Diagnosis Date  . A-fib ([place])   . Hypertension   . Asthma   . CHF (congestive heart failure) ([place])   . Seasonal allergies   . Cataracts,  bilateral   . History of bronchitis   . Anxiety     Xanax PRN  . Arthritis   . History of stomach ulcers    Past Surgical History  Procedure Laterality Date  . Tee without cardioversion N/A 07/13/2014    Procedure: TRANSESOPHAGEAL ECHOCARDIOGRAM (TEE) WITH CARDIOVERSION;  Surgeon: Dixie Dials, MD;  Location: [place];  Service: Cardiovascular;  Laterality: N/A;  . Cardioversion N/A 07/13/2014    Procedure: CARDIOVERSION;  Surgeon: Dixie Dials, MD;  Location: [facility];  Service: Cardiovascular;  Laterality: N/A;  . Left and right heart catheterization with coronary angiogram N/A 07/14/2014    Procedure: LEFT AND RIGHT HEART CATHETERIZATION WITH CORONARY ANGIOGRAM;  Surgeon: Charolette Forward, MD;  Location: [facility] CATH LAB;  Service: Cardiovascular;  Laterality: N/A;  . Cardioversion N/A 10/01/2014    Procedure: CARDIOVERSION;  Surgeon: Larey Dresser, MD;  Location: [place];  Service: Cardiovascular;  Laterality: N/A;  . Cardiac catheterization    . Hernia repair    . Joint replacement      bilateral knee  . Cholecystectomy    . Colonoscopy    . Esophagogastroduodenoscopy    . [place] procedure N/A 03/17/2015    Procedure: BIOLOGIC BENTALL PROCEDURE WITH 27 TISSUE VALVE AND 30 VALSALVA GRAFT;  Surgeon: Grace Isaac, MD;  Location: [place];  Service: Open Heart Surgery;  Laterality: N/A;  . Clipping of atrial appendage N/A 03/17/2015    Procedure: CLIPPING OF LEFT ATRIAL APPENDAGE;  Surgeon: Grace Isaac, MD;  Location: [place];  Service: Open Heart Surgery;  Laterality: N/A;  . Tee without cardioversion N/A 03/17/2015    Procedure: TRANSESOPHAGEAL ECHOCARDIOGRAM (TEE);  Surgeon: Grace Isaac, MD;  Location: [place];  Service: Open Heart Surgery;  Laterality: N/A;  . Maze N/A 03/17/2015    Procedure: LEFT BIPOLAR MAZE;  Surgeon: Grace Isaac, MD;  Location: [place];  Service: Open Heart Surgery;  Laterality: N/A;   Social History:   reports that he has been passively  smoking.  He has never used smokeless tobacco. He reports that he does not drink alcohol or use illicit drugs.  No family history on file.  Medications:   Medication List       This list is accurate as of: 03/30/15  9:17 AM.  Always use your most recent med list.               ADVAIR DISKUS 250-50 MCG/DOSE Aepb  Generic drug:  Fluticasone-Salmeterol  Inhale 1 puff into the lungs 2 (two) times daily.     ALPRAZolam 0.25 MG tablet  Commonly known as:  XANAX  Take 0.25 mg by mouth 2 (two) times daily as needed for anxiety or sleep.     amiodarone 200 MG tablet  Commonly known as:  PACERONE  Take 1 tablet (200 mg total) by mouth daily.     aspirin 81 MG chewable tablet  Chew 1 tablet (81 mg total) by mouth daily.     atorvastatin 20 MG tablet  Commonly known as:  LIPITOR  Take 20 mg by mouth daily at 6 PM.     carvedilol 6.25 MG tablet  Commonly known as:  COREG  Take 1 tablet (6.25 mg total) by mouth 2 (two) times daily with a meal.     ferrous gluconate 324 MG tablet  Commonly known as:  FERGON  Take 1 tablet (324 mg total) by mouth daily with breakfast. For one month then stop.     folic acid 1 MG tablet  Commonly known as:  FOLVITE  Take 1 tablet (1 mg total) by mouth daily. For one month then stop.     lisinopril 2.5 MG tablet  Commonly known as:  PRINIVIL,ZESTRIL  Take 1 tablet (2.5 mg total) by mouth daily.     PROAIR HFA 108 (90 BASE) MCG/ACT inhaler  Generic drug:  albuterol  Inhale 1 puff into the lungs every 4 (four) hours as needed for wheezing or shortness of breath.     SPIRIVA HANDIHALER 18 MCG inhalation capsule  Generic drug:  tiotropium  Place 18 mcg into inhaler and inhale daily.     spironolactone 25 MG tablet  Commonly known as:  ALDACTONE  Take 0.5 tablets (12.5 mg total) by mouth daily.     traMADol 50 MG tablet  Commonly known as:  ULTRAM  Take 1 tablet (50 mg total) by mouth every 6 (six) hours as needed for moderate pain.          Physical Exam: Filed Vitals:   03/30/15 0916  BP: 113/60  Pulse: 88  Temp: 97 F (36.1 C)  Resp: 18    General- elderly male, thin built, in no acute distress Head- normocephalic, atraumatic Nose- normal nasal mucosa, no maxillary or frontal sinus tenderness, no nasal discharge Throat- moist mucus membrane Eyes- PERRLA, EOMI, no pallor, no icterus, no discharge, normal conjunctiva, normal sclera Neck- no cervical lymphadenopathy Cardiovascular- normal s1,s2, no murmurs, palpable dorsalis pedis and radial pulses, 1+ leg edema Respiratory- bilateral clear to auscultation, no wheeze, no rhonchi, no crackles, no use of accessory muscles Abdomen- bowel sounds present, soft, non tender Musculoskeletal- able to move all 4 extremities, generalized weakness  Neurological- no focal deficit, alert and oriented to  person, place and time Skin- warm and dry, sternal incision healing well, has steri strips on lower chest wall area Psychiatry- normal mood and affect    Labs reviewed: Basic Metabolic Panel:  Recent Labs  03/17/15 2315  03/18/15 0415 03/18/15 1530  03/24/15 0230 03/25/15 0417 03/26/15 0234  NA  --   < > 132* 132*  < > 130* 130* 130*  K  --   < > 4.5 5.0  < > 3.8 3.9 3.9  CL  --   < > 105 98*  < > 96* 98* 99*  CO2  --   --  21*  --   < > 28 26 26   GLUCOSE  --   < > 171* 217*  < > 122* 117* 111*  BUN  --   < > 20 21*  < > 14 11 12   CREATININE 1.06  < > 1.04 1.01  0.90  < > 0.83 0.76 0.81  CALCIUM  --   --  8.0*  --   < > 8.2* 8.1* 8.0*  MG 2.8*  --  2.5* 2.3  --   --   --   --   < > = values in this interval not displayed. Liver Function Tests:  Recent Labs  11/04/14 1259 03/15/15 1341 03/23/15 0241  AST 30 25 43*  ALT 32 26 63  ALKPHOS 76 60 119  BILITOT 0.9 0.6 0.5  PROT 6.5 6.4* 4.6*  ALBUMIN 3.8 3.9 2.4*   No results for input(s): LIPASE, AMYLASE in the last 8760 hours. No results for input(s): AMMONIA in the last 8760 hours. CBC:  Recent  Labs  07/10/14 0150 07/10/14 0928  03/23/15 0241 03/24/15 0230 03/25/15 0417  WBC 5.9 4.6  < > 5.9 6.7 6.8  NEUTROABS 3.8 2.7  --   --   --   --   HGB 13.9 13.9  < > 8.1* 7.7* 7.9*  HCT 41.2 41.8  < > 24.2* 23.5* 23.9*  MCV 90.0 90.1  < > 90.6 92.2 91.6  PLT 163 168  < > 127* 155 200  < > = values in this interval not displayed. Cardiac Enzymes:  Recent Labs  07/10/14 0928 07/10/14 1445 07/10/14 2000  TROPONINI <0.03 <0.03 <0.03   BNP: Invalid input(s): POCBNP CBG:  Recent Labs  03/21/15 0719 03/21/15 0810 03/21/15 0851  GLUCAP 69 55* 66    Radiological Exams: Dg Chest 2 View  03/15/2015  CLINICAL DATA:  Aortic insufficiency, history of CHF, atrial fibrillation, and asthma. EXAM: CHEST  2 VIEW COMPARISON:  Portable chest x-ray dated July 10, 2014, and [place] and lateral chest x-ray of July 06, 2014. There is mild multilevel degenerative disc disease of the thoracic spine. FINDINGS: The lungs remain hyperinflated with hemidiaphragm flattening and increased AP dimension of the thorax. There is no alveolar infiltrate. There is a calcified nodule in the right lower lobe posteriorly. The heart and pulmonary vascularity are normal. The mediastinum is normal in width. There is no pleural effusion. There is tortuosity of the descending thoracic aorta. IMPRESSION: Hyperinflation consistent with reactive airway disease, stable. Stable previous granulomatous infection. There is no CHF, pneumonia, nor other acute cardiopulmonary abnormality. Electronically Signed   By: David  Martinique M.D.   On: 03/15/2015 15:59     Assessment/Plan  Physical deconditioning Will have him work with physical therapy and occupational therapy team to help with gait training and muscle strengthening exercises.fall precautions. Skin care. Encourage to be out of bed.  Aortic insufficiency With dilated aortic root. S/p surgical repair. To work with therapy team. To take tramadol 50 mg q6h prn chest wall  soreness. Has f/u with cardiothroacic surgeon and cardiologist.    Acute blood loss anemia Post op, monitor cbc, continue fergon 324 mg daily x 4 weeks  afib Rate controlled. Continue amiodarone 200 mg daily, coreg 6.25 mg bid and baby aspirin  CAD Remains chest pain free. Continue aspirin 81 mg daily, coreg 6.25 mg bid, lisinopril 2.5 mg daily, lipitor 20 mg daily. Monitor VS   Protein calorie malnutrition Monitor weight and po intake, dietary consult  Hyponatremia Monitor bmp  HTN Stable bp, continue coreg and lisinopril current regimen and check VS q shift x 1 week, then daily  Constipation Add colace 100 mg bid and daily prn miralax and monitor  Systolic chf Continue coreg and lisinopril with aldactone for now, daily weight monitor. Has leg edema. Keep legs elevated at rest and add ted hose for now and monitor  Copd Continue advair bid, spiriva daily and prn proair  GAD Continue xanax 0.25 mg bid prn    Goals of care: short term rehabilitation   Labs/tests ordered: cbc, cmp, daily weight  Family/ staff Communication: reviewed care plan with patient and nursing supervisor    Blanchie Serve, MD  [facility] [phone] (Monday-Friday 8 am - 5 pm) [phone] (afterhours)

## 2015-04-01 ENCOUNTER — Non-Acute Institutional Stay (SKILLED_NURSING_FACILITY): Payer: Medicare Other | Admitting: Internal Medicine

## 2015-04-01 DIAGNOSIS — E871 Hypo-osmolality and hyponatremia: Secondary | ICD-10-CM | POA: Diagnosis not present

## 2015-04-01 DIAGNOSIS — I2510 Atherosclerotic heart disease of native coronary artery without angina pectoris: Secondary | ICD-10-CM | POA: Diagnosis not present

## 2015-04-01 DIAGNOSIS — I351 Nonrheumatic aortic (valve) insufficiency: Secondary | ICD-10-CM | POA: Diagnosis not present

## 2015-04-01 DIAGNOSIS — E43 Unspecified severe protein-calorie malnutrition: Secondary | ICD-10-CM

## 2015-04-01 DIAGNOSIS — K5900 Constipation, unspecified: Secondary | ICD-10-CM

## 2015-04-01 DIAGNOSIS — J449 Chronic obstructive pulmonary disease, unspecified: Secondary | ICD-10-CM | POA: Diagnosis not present

## 2015-04-01 DIAGNOSIS — I482 Chronic atrial fibrillation: Secondary | ICD-10-CM | POA: Diagnosis not present

## 2015-04-01 DIAGNOSIS — Z48812 Encounter for surgical aftercare following surgery on the circulatory system: Secondary | ICD-10-CM | POA: Diagnosis not present

## 2015-04-01 DIAGNOSIS — D62 Acute posthemorrhagic anemia: Secondary | ICD-10-CM | POA: Diagnosis not present

## 2015-04-01 DIAGNOSIS — R278 Other lack of coordination: Secondary | ICD-10-CM | POA: Diagnosis not present

## 2015-04-01 DIAGNOSIS — M6281 Muscle weakness (generalized): Secondary | ICD-10-CM | POA: Diagnosis not present

## 2015-04-01 DIAGNOSIS — I5022 Chronic systolic (congestive) heart failure: Secondary | ICD-10-CM | POA: Diagnosis not present

## 2015-04-01 DIAGNOSIS — R2689 Other abnormalities of gait and mobility: Secondary | ICD-10-CM | POA: Diagnosis not present

## 2015-04-01 NOTE — Progress Notes (Signed)
Patient ID: [name], male   DOB: 09-25-1940, 74 y.o.   MRN: [id number]     Facility: [facility]    PCP: Charolette Forward, MD  Code Status: full code  No Known Allergies  Chief Complaint  Patient presents with  . Acute Visit    anemia, low albumin, constipation     HPI:  74 y.o. patient is seen today with abnormal labs. He complaints of being constipated and not having a bowel movement since last Friday. His lab work shows a drop in his Hb and low albumin level. He is here for short term rehabilitation post aortic root replacement and reimplantation of coronary arteries, replacement of aortic valve and root . He denies any chest pain. He has been working with therapy team. He has PMH of coronary artery disease cath 2007, hypertension, prediabetes, hypercholesterolemia, degenerative joint disease, COPD, chronic atrial fibrillation.   Review of Systems:  Constitutional: Negative for fever, chills, diaphoresis.  HENT: Negative for headache, congestion, difficulty swallowing Eyes: Negative for eye pain, blurred vision, double vision and discharge.  Respiratory: Negative for cough, shortness of breath and wheezing.   Cardiovascular: Negative for chest pain, palpitations. Positive for leg swelling.  Gastrointestinal: Negative for heartburn, nausea, vomiting, abdominal pain. Last bowel movement on Friday. Denies rectal bleed or melena. Genitourinary: Negative for dysuria, hematuria Musculoskeletal: Negative for back pain, falls. Skin: Negative for itching, rash.  Neurological: Negative for dizziness, tingling, focal weakness Psychiatric/Behavioral: Negative for depression   Past Medical History  Diagnosis Date  . A-fib ([place])   . Hypertension   . Asthma   . CHF (congestive heart failure) ([place])   . Seasonal allergies   . Cataracts, bilateral   . History of bronchitis   . Anxiety     Xanax PRN  . Arthritis   . History of stomach ulcers      Medications:   Medication List       This list is accurate as of: 04/01/15 12:23 PM.  Always use your most recent med list.               ADVAIR DISKUS 250-50 MCG/DOSE Aepb  Generic drug:  Fluticasone-Salmeterol  Inhale 1 puff into the lungs 2 (two) times daily.     ALPRAZolam 0.25 MG tablet  Commonly known as:  XANAX  Take 0.25 mg by mouth 2 (two) times daily as needed for anxiety or sleep.     amiodarone 200 MG tablet  Commonly known as:  PACERONE  Take 1 tablet (200 mg total) by mouth daily.     aspirin 81 MG chewable tablet  Chew 1 tablet (81 mg total) by mouth daily.     atorvastatin 20 MG tablet  Commonly known as:  LIPITOR  Take 20 mg by mouth daily at 6 PM.     carvedilol 6.25 MG tablet  Commonly known as:  COREG  Take 1 tablet (6.25 mg total) by mouth 2 (two) times daily with a meal.     docusate sodium 100 MG capsule  Commonly known as:  COLACE  Take 100 mg by mouth 2 (two) times daily.     ferrous gluconate 324 MG tablet  Commonly known as:  FERGON  Take 1 tablet (324 mg total) by mouth daily with breakfast. For one month then stop.     folic acid 1 MG tablet  Commonly known as:  FOLVITE  Take 1 tablet (1 mg total) by mouth daily. For one month  then stop.     lisinopril 2.5 MG tablet  Commonly known as:  PRINIVIL,ZESTRIL  Take 1 tablet (2.5 mg total) by mouth daily.     polyethylene glycol packet  Commonly known as:  MIRALAX / GLYCOLAX  Take 17 g by mouth daily as needed.     PROAIR HFA 108 (90 BASE) MCG/ACT inhaler  Generic drug:  albuterol  Inhale 1 puff into the lungs every 4 (four) hours as needed for wheezing or shortness of breath.     SPIRIVA HANDIHALER 18 MCG inhalation capsule  Generic drug:  tiotropium  Place 18 mcg into inhaler and inhale daily.     spironolactone 25 MG tablet  Commonly known as:  ALDACTONE  Take 0.5 tablets (12.5 mg total) by mouth daily.     traMADol 50 MG tablet  Commonly known as:  ULTRAM  Take 1  tablet (50 mg total) by mouth every 6 (six) hours as needed for moderate pain.         Physical Exam: Filed Vitals:   04/01/15 1222  BP: 128/65  Pulse: 63  Temp: 98.1 F (36.7 C)  Resp: 18  SpO2: 98%    General- elderly male, thin built, in no acute distress Head- normocephalic, atraumatic Nose- normal nasal mucosa Throat- moist mucus membrane Eyes- no pallor, no icterus, no discharge, normal conjunctiva, normal sclera Neck- no cervical lymphadenopathy Cardiovascular- normal s1,s2, no murmurs, palpable dorsalis pedis and radial pulses, 1+ leg edema Respiratory- bilateral clear to auscultation, no wheeze, no rhonchi, no crackles, no use of accessory muscles Abdomen- bowel sounds present, soft, non tender Musculoskeletal- able to move all 4 extremities, generalized weakness  Neurological- no focal deficit, alert and oriented to person, place and time   Labs reviewed: Basic Metabolic Panel:  Recent Labs  03/17/15 2315  03/18/15 0415 03/18/15 1530  03/24/15 0230 03/25/15 0417 03/26/15 0234  NA  --   < > 132* 132*  < > 130* 130* 130*  K  --   < > 4.5 5.0  < > 3.8 3.9 3.9  CL  --   < > 105 98*  < > 96* 98* 99*  CO2  --   --  21*  --   < > 28 26 26   GLUCOSE  --   < > 171* 217*  < > 122* 117* 111*  BUN  --   < > 20 21*  < > 14 11 12   CREATININE 1.06  < > 1.04 1.01  0.90  < > 0.83 0.76 0.81  CALCIUM  --   --  8.0*  --   < > 8.2* 8.1* 8.0*  MG 2.8*  --  2.5* 2.3  --   --   --   --   < > = values in this interval not displayed. Liver Function Tests:  Recent Labs  11/04/14 1259 03/15/15 1341 03/23/15 0241  AST 30 25 43*  ALT 32 26 63  ALKPHOS 76 60 119  BILITOT 0.9 0.6 0.5  PROT 6.5 6.4* 4.6*  ALBUMIN 3.8 3.9 2.4*   No results for input(s): LIPASE, AMYLASE in the last 8760 hours. No results for input(s): AMMONIA in the last 8760 hours. CBC:  Recent Labs  07/10/14 0150 07/10/14 0928  03/23/15 0241 03/24/15 0230 03/25/15 0417  WBC 5.9 4.6  < > 5.9 6.7  6.8  NEUTROABS 3.8 2.7  --   --   --   --   HGB 13.9 13.9  < >  8.1* 7.7* 7.9*  HCT 41.2 41.8  < > 24.2* 23.5* 23.9*  MCV 90.0 90.1  < > 90.6 92.2 91.6  PLT 163 168  < > 127* 155 200  < > = values in this interval not displayed.  03/31/15 wbc 7.1, hb 7.6, hct 23.9, plt 317, na 134, k 4.3, bun 11, cr 0.85, alb 2.92, alp 132, lft otherwise wnl, gfr > 60   Assessment/Plan  Anemia Had surgery and likely had blood loss during surgery. Currently on fergon 324 mg daily. Increase this to 324 mg bid for now. He is currently on aspirin. Denies any symptom of gastritis or bleed. Change aspirin to EC 81 mg daily. do guaiac stool x 3. Start omeprazole 20 mg daily for gi prophylaxis for now. Check cbc in 1 week and monitor VS  Protein calorie malnutrition Monitor weekly weight. Get dietary team involved. Start procel 2 scoop bid for now.   Hyponatremia Stable, monitor clinically  Constipation D/c colace. Start senna s 2 tab qhs with daily miralax for now and reassess.    Labs/tests ordered: cbc 1 week, guaiac stool  Family/ staff Communication: reviewed care plan with patient and nursing supervisor    Blanchie Serve, MD  [facility] [phone] (Monday-Friday 8 am - 5 pm) [phone] (afterhours)

## 2015-04-05 ENCOUNTER — Non-Acute Institutional Stay (SKILLED_NURSING_FACILITY): Payer: Medicare Other | Admitting: Nurse Practitioner

## 2015-04-05 DIAGNOSIS — E43 Unspecified severe protein-calorie malnutrition: Secondary | ICD-10-CM

## 2015-04-05 DIAGNOSIS — E871 Hypo-osmolality and hyponatremia: Secondary | ICD-10-CM

## 2015-04-05 DIAGNOSIS — I351 Nonrheumatic aortic (valve) insufficiency: Secondary | ICD-10-CM | POA: Diagnosis not present

## 2015-04-05 DIAGNOSIS — I5022 Chronic systolic (congestive) heart failure: Secondary | ICD-10-CM | POA: Diagnosis not present

## 2015-04-05 DIAGNOSIS — I2510 Atherosclerotic heart disease of native coronary artery without angina pectoris: Secondary | ICD-10-CM

## 2015-04-05 DIAGNOSIS — K5900 Constipation, unspecified: Secondary | ICD-10-CM | POA: Diagnosis not present

## 2015-04-05 DIAGNOSIS — D62 Acute posthemorrhagic anemia: Secondary | ICD-10-CM | POA: Diagnosis not present

## 2015-04-05 NOTE — Progress Notes (Signed)
Patient ID: [name], male   DOB: 1941-02-11, 74 y.o.   MRN: [id number]    Nursing Home Location:  [place] of Service: SNF (31)  PCP: Charolette Forward, MD  No Known Allergies  Chief Complaint  Patient presents with  . Discharge Note    HPI:  Patient is a 74 y.o. male seen today at [facility] for discharge home. Pt has PMH of coronary artery disease cath 2007, hypertension, prediabetes, hypercholesterolemia, degenerative joint disease, COPD, chronic atrial fibrillation. Pt is at [facility] for short term rehabilitation after hospitalization  from 03/17/15-03/26/15 with severely dilated aortic root, moderate aortic insufficiency and underwent aortic root replacement and reimplantation of coronary arteries, replacement of aortic valve and root with pericardial tissue valve,  pulmonary vein MAZE procedure with left-sided pulmonary isolation lesions on 03/17/2015. He has been doing well without complaints today. Pt with ongoing anemia since admission however without any bleeding noted, no shortness of breath, chest pains. No bloody or dark stools noted. Patient currently doing well with therapy, now stable to discharge home with home health.   Review of Systems:  Review of Systems  Constitutional: Negative for activity change, appetite change, fatigue and unexpected weight change.  HENT: Negative for congestion and hearing loss.   Eyes: Negative.   Respiratory: Negative for cough and shortness of breath.   Cardiovascular: Negative for chest pain, palpitations and leg swelling.  Gastrointestinal: Negative for abdominal pain, diarrhea and constipation.  Genitourinary: Negative for dysuria and difficulty urinating.  Musculoskeletal: Negative for myalgias and arthralgias.  Skin: Negative for color change and wound.  Neurological: Negative for dizziness and weakness.  Psychiatric/Behavioral: Negative for behavioral problems, confusion and  agitation.    Past Medical History  Diagnosis Date  . A-fib ([place])   . Hypertension   . Asthma   . CHF (congestive heart failure) ([place])   . Seasonal allergies   . Cataracts, bilateral   . History of bronchitis   . Anxiety     Xanax PRN  . Arthritis   . History of stomach ulcers    Past Surgical History  Procedure Laterality Date  . Tee without cardioversion N/A 07/13/2014    Procedure: TRANSESOPHAGEAL ECHOCARDIOGRAM (TEE) WITH CARDIOVERSION;  Surgeon: Dixie Dials, MD;  Location: [place];  Service: Cardiovascular;  Laterality: N/A;  . Cardioversion N/A 07/13/2014    Procedure: CARDIOVERSION;  Surgeon: Dixie Dials, MD;  Location: [facility] ENDOSCOPY;  Service: Cardiovascular;  Laterality: N/A;  . Left and right heart catheterization with coronary angiogram N/A 07/14/2014    Procedure: LEFT AND RIGHT HEART CATHETERIZATION WITH CORONARY ANGIOGRAM;  Surgeon: Charolette Forward, MD;  Location: [facility] CATH LAB;  Service: Cardiovascular;  Laterality: N/A;  . Cardioversion N/A 10/01/2014    Procedure: CARDIOVERSION;  Surgeon: Larey Dresser, MD;  Location: [place];  Service: Cardiovascular;  Laterality: N/A;  . Cardiac catheterization    . Hernia repair    . Joint replacement      bilateral knee  . Cholecystectomy    . Colonoscopy    . Esophagogastroduodenoscopy    . Bentall procedure N/A 03/17/2015    Procedure: BIOLOGIC BENTALL PROCEDURE WITH 27 TISSUE VALVE AND 30 VALSALVA GRAFT;  Surgeon: Grace Isaac, MD;  Location: [place];  Service: Open Heart Surgery;  Laterality: N/A;  . Clipping of atrial appendage N/A 03/17/2015    Procedure: CLIPPING OF LEFT ATRIAL APPENDAGE;  Surgeon: Grace Isaac, MD;  Location: [place];  Service: Open Heart Surgery;  Laterality: N/A;  . Tee without cardioversion N/A 03/17/2015    Procedure: TRANSESOPHAGEAL ECHOCARDIOGRAM (TEE);  Surgeon: Grace Isaac, MD;  Location: [place];  Service: Open Heart Surgery;  Laterality: N/A;  . Maze N/A 03/17/2015     Procedure: LEFT BIPOLAR MAZE;  Surgeon: Grace Isaac, MD;  Location: [place];  Service: Open Heart Surgery;  Laterality: N/A;   Social History:   reports that he has been passively smoking.  He has never used smokeless tobacco. He reports that he does not drink alcohol or use illicit drugs.  No family history on file.  Medications: Patient's Medications  New Prescriptions   No medications on file  Previous Medications   ADVAIR DISKUS 250-50 MCG/DOSE AEPB    Inhale 1 puff into the lungs 2 (two) times daily.    ALPRAZOLAM (XANAX) 0.25 MG TABLET    Take 0.25 mg by mouth 2 (two) times daily as needed for anxiety or sleep.    AMIODARONE (PACERONE) 200 MG TABLET    Take 1 tablet (200 mg total) by mouth daily.   ASPIRIN 81 MG CHEWABLE TABLET    Chew 1 tablet (81 mg total) by mouth daily.   ATORVASTATIN (LIPITOR) 20 MG TABLET    Take 20 mg by mouth daily at 6 PM.    CARVEDILOL (COREG) 6.25 MG TABLET    Take 1 tablet (6.25 mg total) by mouth 2 (two) times daily with a meal.   DOCUSATE SODIUM (COLACE) 100 MG CAPSULE    Take 100 mg by mouth 2 (two) times daily.   FERROUS GLUCONATE (FERGON) 324 MG TABLET    Take 1 tablet (324 mg total) by mouth daily with breakfast. For one month then stop.   FOLIC ACID (FOLVITE) 1 MG TABLET    Take 1 tablet (1 mg total) by mouth daily. For one month then stop.   LISINOPRIL (PRINIVIL,ZESTRIL) 2.5 MG TABLET    Take 1 tablet (2.5 mg total) by mouth daily.   OMEPRAZOLE (PRILOSEC) 20 MG CAPSULE    Take 20 mg by mouth daily.   POLYETHYLENE GLYCOL (MIRALAX / GLYCOLAX) PACKET    Take 17 g by mouth daily.    PROAIR HFA 108 (90 BASE) MCG/ACT INHALER    Inhale 1 puff into the lungs every 4 (four) hours as needed for wheezing or shortness of breath.    SENNOSIDES-DOCUSATE SODIUM (SENOKOT-S) 8.6-50 MG TABLET    Take 2 tablets by mouth daily.   SPIRIVA HANDIHALER 18 MCG INHALATION CAPSULE    Place 18 mcg into inhaler and inhale daily.    SPIRONOLACTONE (ALDACTONE) 25 MG TABLET     Take 0.5 tablets (12.5 mg total) by mouth daily.   TRAMADOL (ULTRAM) 50 MG TABLET    Take 1 tablet (50 mg total) by mouth every 6 (six) hours as needed for moderate pain.  Modified Medications   No medications on file  Discontinued Medications   No medications on file     Physical Exam: Filed Vitals:   04/05/15 1623  BP: 113/63  Pulse: 60  Temp: 96.6 F (35.9 C)  Resp: 20    Physical Exam  Constitutional: He is oriented to person, place, and time. No distress.  HENT:  Head: Normocephalic and atraumatic.  Mouth/Throat: Oropharynx is clear and moist. No oropharyngeal exudate.  Eyes: Conjunctivae and EOM are normal. Pupils are equal, round, and reactive to light.  Neck: Normal range of motion. Neck supple.  Cardiovascular: Normal rate,  regular rhythm and normal heart sounds.   Pulmonary/Chest: Effort normal and breath sounds normal.  Abdominal: Soft. Bowel sounds are normal.  Musculoskeletal: He exhibits no edema or tenderness.  Neurological: He is alert and oriented to person, place, and time.  Skin: Skin is warm and dry. He is not diaphoretic.  Psychiatric: He has a normal mood and affect.    Labs reviewed: Basic Metabolic Panel:  Recent Labs  03/17/15 2315  03/18/15 0415 03/18/15 1530  03/24/15 0230 03/25/15 0417 03/26/15 0234  NA  --   < > 132* 132*  < > 130* 130* 130*  K  --   < > 4.5 5.0  < > 3.8 3.9 3.9  CL  --   < > 105 98*  < > 96* 98* 99*  CO2  --   --  21*  --   < > 28 26 26   GLUCOSE  --   < > 171* 217*  < > 122* 117* 111*  BUN  --   < > 20 21*  < > 14 11 12   CREATININE 1.06  < > 1.04 1.01  0.90  < > 0.83 0.76 0.81  CALCIUM  --   --  8.0*  --   < > 8.2* 8.1* 8.0*  MG 2.8*  --  2.5* 2.3  --   --   --   --   < > = values in this interval not displayed. Liver Function Tests:  Recent Labs  11/04/14 1259 03/15/15 1341 03/23/15 0241  AST 30 25 43*  ALT 32 26 63  ALKPHOS 76 60 119  BILITOT 0.9 0.6 0.5  PROT 6.5 6.4* 4.6*  ALBUMIN 3.8 3.9 2.4*    No results for input(s): LIPASE, AMYLASE in the last 8760 hours. No results for input(s): AMMONIA in the last 8760 hours. CBC:  Recent Labs  07/10/14 0150 07/10/14 0928  03/23/15 0241 03/24/15 0230 03/25/15 0417  WBC 5.9 4.6  < > 5.9 6.7 6.8  NEUTROABS 3.8 2.7  --   --   --   --   HGB 13.9 13.9  < > 8.1* 7.7* 7.9*  HCT 41.2 41.8  < > 24.2* 23.5* 23.9*  MCV 90.0 90.1  < > 90.6 92.2 91.6  PLT 163 168  < > 127* 155 200  < > = values in this interval not displayed. TSH:  Recent Labs  09/08/14 1300 11/04/14 1259  TSH 5.188* 2.225   A1C: Lab Results  Component Value Date   HGBA1C 6.1* 03/15/2015   Lipid Panel: No results for input(s): CHOL, HDL, LDLCALC, TRIG, CHOLHDL, LDLDIRECT in the last 8760 hours.    Assessment/Plan 1. Acute blood loss anemia Blood loss from surgery, conts on iron 325 mg BID. ASA changed to EC and omprazole 20 mg daily for GI prophylaxis. No symptoms of anemia, will need follow up of hgb by PCP  2. Constipation, unspecified constipation type Bowels stable, cont current regimen   3. Hyponatremia Remains stable.   4. Protein-calorie malnutrition, severe ([place]) -cont supplements, will need ongoing follow up by PCP.   5. Chronic systolic CHF (congestive heart failure) (HCC) Stable, euvolemic at this time cont coreg, lisinopril, aldactone at this time  6. AI (aortic insufficiency) With dilated aortic root. S/p surgical repair. Ongoing follow up with cardiothroacic surgeon and cardiologist.   7. Coronary artery disease involving native coronary artery of native heart without angina pectoris Without chest pain. Continue aspirin 81 mg daily, coreg 6.25 mg  bid, lisinopril 2.5 mg daily, lipitor 20 mg daily.  pt is stable for discharge-will need PT/OT per home health. No DME needed. Rx written.  will need to follow up with PCP within 1-2 weeks for anemia follow up and follow up with cardiology and cardiothoracic surgeon .     Carlos American. Harle Battiest  [facility] [phone] 8 am - 5 pm) [phone] (after hours)

## 2015-04-09 DIAGNOSIS — D649 Anemia, unspecified: Secondary | ICD-10-CM | POA: Diagnosis not present

## 2015-04-09 DIAGNOSIS — J449 Chronic obstructive pulmonary disease, unspecified: Secondary | ICD-10-CM | POA: Diagnosis not present

## 2015-04-09 DIAGNOSIS — E785 Hyperlipidemia, unspecified: Secondary | ICD-10-CM | POA: Diagnosis not present

## 2015-04-09 DIAGNOSIS — I480 Paroxysmal atrial fibrillation: Secondary | ICD-10-CM | POA: Diagnosis not present

## 2015-04-09 DIAGNOSIS — I119 Hypertensive heart disease without heart failure: Secondary | ICD-10-CM | POA: Diagnosis not present

## 2015-04-09 DIAGNOSIS — Z952 Presence of prosthetic heart valve: Secondary | ICD-10-CM | POA: Diagnosis not present

## 2015-04-11 DIAGNOSIS — R262 Difficulty in walking, not elsewhere classified: Secondary | ICD-10-CM | POA: Diagnosis not present

## 2015-04-11 DIAGNOSIS — I5030 Unspecified diastolic (congestive) heart failure: Secondary | ICD-10-CM | POA: Diagnosis not present

## 2015-04-11 DIAGNOSIS — M6281 Muscle weakness (generalized): Secondary | ICD-10-CM | POA: Diagnosis not present

## 2015-04-11 DIAGNOSIS — I110 Hypertensive heart disease with heart failure: Secondary | ICD-10-CM | POA: Diagnosis not present

## 2015-04-11 DIAGNOSIS — I2510 Atherosclerotic heart disease of native coronary artery without angina pectoris: Secondary | ICD-10-CM | POA: Diagnosis not present

## 2015-04-11 DIAGNOSIS — Z48812 Encounter for surgical aftercare following surgery on the circulatory system: Secondary | ICD-10-CM | POA: Diagnosis not present

## 2015-04-14 DIAGNOSIS — I5030 Unspecified diastolic (congestive) heart failure: Secondary | ICD-10-CM | POA: Diagnosis not present

## 2015-04-14 DIAGNOSIS — I2510 Atherosclerotic heart disease of native coronary artery without angina pectoris: Secondary | ICD-10-CM | POA: Diagnosis not present

## 2015-04-14 DIAGNOSIS — I110 Hypertensive heart disease with heart failure: Secondary | ICD-10-CM | POA: Diagnosis not present

## 2015-04-14 DIAGNOSIS — M6281 Muscle weakness (generalized): Secondary | ICD-10-CM | POA: Diagnosis not present

## 2015-04-14 DIAGNOSIS — R262 Difficulty in walking, not elsewhere classified: Secondary | ICD-10-CM | POA: Diagnosis not present

## 2015-04-14 DIAGNOSIS — Z48812 Encounter for surgical aftercare following surgery on the circulatory system: Secondary | ICD-10-CM | POA: Diagnosis not present

## 2015-04-15 DIAGNOSIS — I2510 Atherosclerotic heart disease of native coronary artery without angina pectoris: Secondary | ICD-10-CM | POA: Diagnosis not present

## 2015-04-15 DIAGNOSIS — I5030 Unspecified diastolic (congestive) heart failure: Secondary | ICD-10-CM | POA: Diagnosis not present

## 2015-04-15 DIAGNOSIS — I110 Hypertensive heart disease with heart failure: Secondary | ICD-10-CM | POA: Diagnosis not present

## 2015-04-15 DIAGNOSIS — M6281 Muscle weakness (generalized): Secondary | ICD-10-CM | POA: Diagnosis not present

## 2015-04-15 DIAGNOSIS — Z48812 Encounter for surgical aftercare following surgery on the circulatory system: Secondary | ICD-10-CM | POA: Diagnosis not present

## 2015-04-15 DIAGNOSIS — R262 Difficulty in walking, not elsewhere classified: Secondary | ICD-10-CM | POA: Diagnosis not present

## 2015-04-16 ENCOUNTER — Inpatient Hospital Stay: Payer: Medicare Other | Admitting: Adult Health

## 2015-04-16 DIAGNOSIS — Z48812 Encounter for surgical aftercare following surgery on the circulatory system: Secondary | ICD-10-CM | POA: Diagnosis not present

## 2015-04-16 DIAGNOSIS — R262 Difficulty in walking, not elsewhere classified: Secondary | ICD-10-CM | POA: Diagnosis not present

## 2015-04-16 DIAGNOSIS — M6281 Muscle weakness (generalized): Secondary | ICD-10-CM | POA: Diagnosis not present

## 2015-04-16 DIAGNOSIS — I110 Hypertensive heart disease with heart failure: Secondary | ICD-10-CM | POA: Diagnosis not present

## 2015-04-16 DIAGNOSIS — I2510 Atherosclerotic heart disease of native coronary artery without angina pectoris: Secondary | ICD-10-CM | POA: Diagnosis not present

## 2015-04-16 DIAGNOSIS — I5030 Unspecified diastolic (congestive) heart failure: Secondary | ICD-10-CM | POA: Diagnosis not present

## 2015-04-19 DIAGNOSIS — I110 Hypertensive heart disease with heart failure: Secondary | ICD-10-CM | POA: Diagnosis not present

## 2015-04-19 DIAGNOSIS — Z48812 Encounter for surgical aftercare following surgery on the circulatory system: Secondary | ICD-10-CM | POA: Diagnosis not present

## 2015-04-19 DIAGNOSIS — I5030 Unspecified diastolic (congestive) heart failure: Secondary | ICD-10-CM | POA: Diagnosis not present

## 2015-04-19 DIAGNOSIS — R262 Difficulty in walking, not elsewhere classified: Secondary | ICD-10-CM | POA: Diagnosis not present

## 2015-04-19 DIAGNOSIS — I2510 Atherosclerotic heart disease of native coronary artery without angina pectoris: Secondary | ICD-10-CM | POA: Diagnosis not present

## 2015-04-19 DIAGNOSIS — M6281 Muscle weakness (generalized): Secondary | ICD-10-CM | POA: Diagnosis not present

## 2015-04-20 ENCOUNTER — Encounter (HOSPITAL_COMMUNITY): Payer: Medicare Other

## 2015-04-21 DIAGNOSIS — I2510 Atherosclerotic heart disease of native coronary artery without angina pectoris: Secondary | ICD-10-CM | POA: Diagnosis not present

## 2015-04-21 DIAGNOSIS — R262 Difficulty in walking, not elsewhere classified: Secondary | ICD-10-CM | POA: Diagnosis not present

## 2015-04-21 DIAGNOSIS — Z48812 Encounter for surgical aftercare following surgery on the circulatory system: Secondary | ICD-10-CM | POA: Diagnosis not present

## 2015-04-21 DIAGNOSIS — I110 Hypertensive heart disease with heart failure: Secondary | ICD-10-CM | POA: Diagnosis not present

## 2015-04-21 DIAGNOSIS — M6281 Muscle weakness (generalized): Secondary | ICD-10-CM | POA: Diagnosis not present

## 2015-04-21 DIAGNOSIS — I5030 Unspecified diastolic (congestive) heart failure: Secondary | ICD-10-CM | POA: Diagnosis not present

## 2015-05-05 ENCOUNTER — Other Ambulatory Visit: Payer: Self-pay | Admitting: Cardiothoracic Surgery

## 2015-05-05 DIAGNOSIS — I719 Aortic aneurysm of unspecified site, without rupture: Secondary | ICD-10-CM

## 2015-05-06 ENCOUNTER — Ambulatory Visit
Admission: RE | Admit: 2015-05-06 | Discharge: 2015-05-06 | Disposition: A | Discharge status: Home / Self Care | Payer: Medicare Other | Source: Ambulatory Visit | Attending: Cardiothoracic Surgery | Admitting: Cardiothoracic Surgery

## 2015-05-06 ENCOUNTER — Encounter: Payer: Self-pay | Admitting: Cardiothoracic Surgery

## 2015-05-06 ENCOUNTER — Ambulatory Visit (INDEPENDENT_AMBULATORY_CARE_PROVIDER_SITE_OTHER): Payer: Self-pay | Admitting: Cardiothoracic Surgery

## 2015-05-06 VITALS — BP 135/83 | HR 67 | Resp 16 | Ht 74.0 in | Wt 150.0 lb

## 2015-05-06 DIAGNOSIS — Z09 Encounter for follow-up examination after completed treatment for conditions other than malignant neoplasm: Secondary | ICD-10-CM

## 2015-05-06 DIAGNOSIS — I351 Nonrheumatic aortic (valve) insufficiency: Secondary | ICD-10-CM

## 2015-05-06 DIAGNOSIS — I509 Heart failure, unspecified: Secondary | ICD-10-CM | POA: Diagnosis not present

## 2015-05-06 DIAGNOSIS — I719 Aortic aneurysm of unspecified site, without rupture: Secondary | ICD-10-CM

## 2015-05-06 DIAGNOSIS — I482 Chronic atrial fibrillation, unspecified: Secondary | ICD-10-CM

## 2015-05-06 DIAGNOSIS — I7121 Aneurysm of the ascending aorta, without rupture: Secondary | ICD-10-CM

## 2015-05-06 NOTE — Progress Notes (Signed)
[address]       [place] [address]             [phone]      [name] [place] Medical Record [id number] Date of Birth: 1941-04-12  Referring: Charolette Forward, MD Primary Care: Charolette Forward, MD  Chief Complaint:   POST OP FOLLOW UP 03/17/2015 OPERATIVE REPORT PREOPERATIVE DIAGNOSES: Severely dilated aortic root with moderate aortic insufficiency and history of paroxysmal atrial fibrillation. 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 SURGICAL PROCEDURE: Biologic Bentall with aortic root replacement and reimplantation of coronary arteries, replacement of aortic valve and root with pericardial tissue valve [employer] model 3300TFX 27 mm serial [id number] and 30 mm Valsalva graft and pulmonary vein MAZE procedure with left-sided pulmonary isolation lesions, placement of 35 mm atrial clip. SURGEON: Lanelle Bal, MD   History of Present Illness:      Patient doing well after surgery, return to near-normal activities without evidence of congestive heart failure. He's unaware of any recurrence of atrial fibrillation. Initially he was discharged to a rehabilitation center but is now living independently at home with help from his neighbors.   Past Medical History  Diagnosis Date  . A-fib ([place])   . Hypertension   . Asthma   . CHF (congestive heart failure) ([place])   . Seasonal allergies   . Cataracts, bilateral   . History of bronchitis   . Anxiety     Xanax PRN  . Arthritis   . History of stomach ulcers      History  Smoking status  . Passive Smoke Exposure - Never Smoker  Smokeless tobacco  . Never Used    History  Alcohol Use No     No Known Allergies  Current Outpatient Prescriptions  Medication Sig Dispense Refill  . ADVAIR DISKUS 250-50 MCG/DOSE AEPB Inhale 1 puff into the lungs 2 (two) times daily.     Marland Kitchen  ALPRAZolam (XANAX) 0.25 MG tablet Take 0.25 mg by mouth 2 (two) times daily as needed for anxiety or sleep.     Marland Kitchen amiodarone (PACERONE) 200 MG tablet Take 1 tablet (200 mg total) by mouth daily. 30 tablet 3  . aspirin 81 MG chewable tablet Chew 1 tablet (81 mg total) by mouth daily.    Marland Kitchen atorvastatin (LIPITOR) 20 MG tablet Take 20 mg by mouth daily at 6 PM.     . carvedilol (COREG) 6.25 MG tablet Take 1 tablet (6.25 mg total) by mouth 2 (two) times daily with a meal. 60 tablet 6  . ferrous gluconate (FERGON) 324 MG tablet Take 1 tablet (324 mg total) by mouth daily with breakfast. For one month then stop. (Patient taking differently: Take 324 mg by mouth 2 (two) times daily with a meal. For one month then stop.) 30 tablet 0  . folic acid (FOLVITE) 1 MG tablet Take 1 tablet (1 mg total) by mouth daily. For one month then stop.    Marland Kitchen lisinopril (PRINIVIL,ZESTRIL) 2.5 MG tablet Take 1 tablet (2.5 mg total) by mouth daily. (Patient taking differently: Take 5 mg by mouth. )    . PROAIR HFA 108 (90 BASE) MCG/ACT inhaler Inhale 1 puff into the lungs every 4 (four) hours as needed for wheezing or shortness of breath.     . SPIRIVA HANDIHALER 18 MCG inhalation capsule Place 18 mcg into inhaler and inhale daily.     Marland Kitchen spironolactone (  ALDACTONE) 25 MG tablet Take 0.5 tablets (12.5 mg total) by mouth daily. 30 tablet 3   No current facility-administered medications for this visit.       Physical Exam: BP 135/83 mmHg  Pulse 67  Resp 16  Ht 6\' 2"  (1.88 m)  Wt 150 lb (68.04 kg)  BMI 19.25 kg/m2  SpO2 97%  General appearance: alert and cooperative Neurologic: intact Heart: regular rate and rhythm, S1, S2 normal, no murmur, click, rub or gallop Lungs: clear to auscultation bilaterally Abdomen: soft, non-tender; bowel sounds normal; no masses,  no organomegaly Extremities: extremities normal, atraumatic, no cyanosis or edema and Homans sign is negative, no sign of DVT Wound: Sternum is stable and  well-healed Patient on rhythm strip in the office today is in sinus rhythm, he has no murmur of aortic insufficiency  Diagnostic Studies & Laboratory data:     Recent Radiology Findings:   Dg Chest 2 View  05/06/2015  CLINICAL DATA: Hypertension. Atrial fibrillation. Congestive heart failure. Prior surgery. EXAM: CHEST  2 VIEW COMPARISON:  03/23/2015. FINDINGS: Mediastinum and hilar structures normal. Prior median sternotomy. Postsurgical changes including prior cardiac valve replacement. Lungs are clear of infiltrates. Changes of COPD. Pleural parenchymal thickening consistent scarring. Degenerative changes thoracic spine. IMPRESSION: 1. Postsurgical changes. Stable cardiomegaly. No evidence for overt congestive heart failure. 2. COPD. No acute pulmonary disease. Bilateral pleural parenchymal thickening noted consistent with scarring . Electronically Signed   By: Marcello Moores  Register   On: 05/06/2015 14:20      Recent Lab Findings: Lab Results  Component Value Date   WBC 6.8 03/25/2015   HGB 7.9* 03/25/2015   HCT 23.9* 03/25/2015   PLT 200 03/25/2015   GLUCOSE 111* 03/26/2015   ALT 63 03/23/2015   AST 43* 03/23/2015   NA 130* 03/26/2015   K 3.9 03/26/2015   CL 99* 03/26/2015   CREATININE 0.81 03/26/2015   BUN 12 03/26/2015   CO2 26 03/26/2015   TSH 2.225 11/04/2014   INR 2.18* 03/17/2015   HGBA1C 6.1* 03/15/2015      Assessment / Plan:     Patient making good progress postoperatively after biologic Bentall and Maze procedure. He appears to be remaining in sinus rhythm without evidence of congestive heart failure. I again reviewed with him the need for antibiotic prophylaxis prior to dental or other invasive procedures We'll plan to see him back in 4 months.    Grace Isaac MD      [address] [place] [address] Office [phone]   Beeper [phone]  05/06/2015 2:57 PM

## 2015-05-06 NOTE — Patient Instructions (Signed)

## 2015-06-10 ENCOUNTER — Encounter (HOSPITAL_COMMUNITY): Payer: Self-pay

## 2015-06-10 ENCOUNTER — Ambulatory Visit (HOSPITAL_COMMUNITY)
Admission: RE | Admit: 2015-06-10 | Discharge: 2015-06-10 | Disposition: A | Discharge status: Home / Self Care | Payer: Medicare Other | Source: Ambulatory Visit | Attending: Cardiology | Admitting: Cardiology

## 2015-06-10 VITALS — BP 110/60 | HR 98 | Wt 156.8 lb

## 2015-06-10 DIAGNOSIS — E785 Hyperlipidemia, unspecified: Secondary | ICD-10-CM | POA: Insufficient documentation

## 2015-06-10 DIAGNOSIS — I7121 Aneurysm of the ascending aorta, without rupture: Secondary | ICD-10-CM

## 2015-06-10 DIAGNOSIS — Z87891 Personal history of nicotine dependence: Secondary | ICD-10-CM | POA: Diagnosis not present

## 2015-06-10 DIAGNOSIS — I110 Hypertensive heart disease with heart failure: Secondary | ICD-10-CM | POA: Diagnosis not present

## 2015-06-10 DIAGNOSIS — Z953 Presence of xenogenic heart valve: Secondary | ICD-10-CM | POA: Diagnosis not present

## 2015-06-10 DIAGNOSIS — I428 Other cardiomyopathies: Secondary | ICD-10-CM | POA: Insufficient documentation

## 2015-06-10 DIAGNOSIS — Z95828 Presence of other vascular implants and grafts: Secondary | ICD-10-CM | POA: Diagnosis not present

## 2015-06-10 DIAGNOSIS — J449 Chronic obstructive pulmonary disease, unspecified: Secondary | ICD-10-CM | POA: Diagnosis not present

## 2015-06-10 DIAGNOSIS — I719 Aortic aneurysm of unspecified site, without rupture: Secondary | ICD-10-CM | POA: Diagnosis not present

## 2015-06-10 DIAGNOSIS — Z79899 Other long term (current) drug therapy: Secondary | ICD-10-CM | POA: Diagnosis not present

## 2015-06-10 DIAGNOSIS — I480 Paroxysmal atrial fibrillation: Secondary | ICD-10-CM | POA: Diagnosis not present

## 2015-06-10 DIAGNOSIS — I5022 Chronic systolic (congestive) heart failure: Secondary | ICD-10-CM | POA: Insufficient documentation

## 2015-06-10 DIAGNOSIS — Z8249 Family history of ischemic heart disease and other diseases of the circulatory system: Secondary | ICD-10-CM | POA: Insufficient documentation

## 2015-06-10 LAB — CBC
HEMATOCRIT: 36.2 % — AB (ref 39.0–52.0)
HEMOGLOBIN: 11.6 g/dL — AB (ref 13.0–17.0)
MCH: 28.9 pg (ref 26.0–34.0)
MCHC: 32 g/dL (ref 30.0–36.0)
MCV: 90 fL (ref 78.0–100.0)
Platelets: 183 10*3/uL (ref 150–400)
RBC: 4.02 MIL/uL — AB (ref 4.22–5.81)
RDW: 13.2 % (ref 11.5–15.5)
WBC: 7.2 10*3/uL (ref 4.0–10.5)

## 2015-06-10 LAB — COMPREHENSIVE METABOLIC PANEL
ALBUMIN: 3.9 g/dL (ref 3.5–5.0)
ALK PHOS: 78 U/L (ref 38–126)
ALT: 27 U/L (ref 17–63)
ANION GAP: 11 (ref 5–15)
AST: 25 U/L (ref 15–41)
BILIRUBIN TOTAL: 0.4 mg/dL (ref 0.3–1.2)
BUN: 15 mg/dL (ref 6–20)
CALCIUM: 9.3 mg/dL (ref 8.9–10.3)
CO2: 22 mmol/L (ref 22–32)
Chloride: 101 mmol/L (ref 101–111)
Creatinine, Ser: 0.9 mg/dL (ref 0.61–1.24)
GFR calc non Af Amer: >60 mL/min (ref >60)
Glucose, Bld: 128 mg/dL — ABNORMAL HIGH (ref 65–99)
POTASSIUM: 4 mmol/L (ref 3.5–5.1)
SODIUM: 134 mmol/L — AB (ref 135–145)
TOTAL PROTEIN: 6.5 g/dL (ref 6.5–8.1)

## 2015-06-10 LAB — TSH: TSH: 2.631 u[IU]/mL (ref 0.350–4.500)

## 2015-06-10 NOTE — Patient Instructions (Signed)
Follow up in 6 months with Dr.Mclean (please call the office to schedule appointment)

## 2015-06-11 NOTE — Progress Notes (Signed)
Patient ID: [name], male   DOB: 1940-11-16, 75 y.o.   MRN: [id number] Cardiologist: Dr. Aundra Dubin  75 yo with history of HTN and COPD was admitted in 3/16 with atrial fibrillation/RVR and dyspnea.  Echo showed low EF (15-20% range).  He had TEE-guided DCCV to NSR and cardiac catheterization.  Cath showed no obstructive coronary disease (nonischemic cardiomyopathy).  Echo showed dilated aortic root.  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  He has seen Dr Servando Snare for evaluation for aortic root replacement.   At initial appointment, patient was noted to be back in atrial fibrillation.  He was put on amiodarone and I took him for DCCV in 6/16.  DCCV was unsuccessful, but I left him on amiodarone for the time being.  He later converted back to NSR and remains in NSR today.  Cardiac MRI/MRA chest in 9/16 showed EF improved to 51% with moderate AI and 5.6 cm aortic root.   In 11/16, he had Bentall procedure with bioprosthetic aortic valve, LAA clipping, and Maze procedure.  He has done well since then.  Post-op echo also in 11/16 showed a normally functioning bioprosthetic aortic valve and EF 50%.  No exertional dyspnea.  No chest pain.  No orthopnea/PND.  Able to do pretty much what he wants.    Labs (3/16): HCT 43.4, K 4, creatinine 0.88 Labs (08/2014): K 5.1 => 4.6, Creatinine 1.12 => 1.36, Hgb 15.0  Labs (7/16): K 4.6, creatinine 1.07, TSH normal, free T4/free T3 normal, LFTs normal, HCT 35.9 Labs (9/16): creatinine 1.21 Labs (11/16): K 3.9, creatinine 0.81, hgb 7.9  ECG: NSR, RBBB, LAFB  PMH: 1. HTN 2. Hyperlipidemia 3. Osteoarthritis 4. Atrial fibrillation: Paroxysmal.  Atrial fibrillation first noted in 3/16.  TEE-guided DCCV in 3/16.  Noted to be back in atrial fibrillation 5/16 CHF appt. DCCV 6/16 was unsuccessful but he was in NSR at 7/16 appt. He had LA appendage clipping and Maze procedure with Bentall in 11/16.   5. Right upper lobe lung nodule: PET negative.  6. COPD: PFTs (3/16) with FVC 75%, FEV1 57%, ratio 75%, TLC 101%, DLCO 67%.  Moderate to severe obstructive disease.  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  CTA chest (3/16) with 5.7 cm aortic root with effacement of STJ, no dissection. The aortic valve is trileaflet. MRA chest (9/16) with 5.6 cm aortic root.  S/p Bentall procedure for aortic root replacement in 11/16. 8. Nonischemic cardiomyopathy: Echo (4/16) with EF 15-20%, moderate LV dilation, mild AI.  RHC/LHC (3/16) with mean RA 3, PA 24/7, mean PCWP 16, CI 4.1, EF 20-25%, no significant CAD.  Cardiac MRI (9/16) with EF 51%, diffuse mild HK, mildly dilated RV with normal systolic function, moderate AI with trileaflet aortic valve, no delayed enhancement.  Echo (11/16) with EF 50%, mild LVH, normal bioprosthetic aortic valve, normal RV size with mildly decreased systolic function, PASP 38 mmHg. Marland Kitchen  9. 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  Moderate by TEE in 3/16, mild by TTE in 4/16.  Moderate by MRI 9/16. Bioprosthetic AVR with Bentall in 11/16.    SH: Separated from wife, lives in [place], has not smoked for many years but has been around second hand smoke a lot, no ETOH.  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   FH: Father with MI at 75, mother with "heart trouble."  ROS: All systems reviewed and negative except as per HPI.   Current Outpatient Prescriptions  Medication Sig Dispense Refill  . ADVAIR DISKUS 250-50 MCG/DOSE AEPB Inhale 1 puff into the lungs 2 (two) times daily.     Marland Kitchen ALPRAZolam (XANAX) 0.25 MG tablet Take 0.25 mg by mouth 2 (two) times daily as needed for anxiety or sleep.     Marland Kitchen amiodarone (PACERONE) 200 MG tablet Take 1 tablet (200 mg total) by mouth daily. 30 tablet 3  . aspirin 81 MG chewable tablet Chew 1 tablet (81 mg total) by mouth daily.    Marland Kitchen atorvastatin (LIPITOR) 20 MG tablet Take 20  mg by mouth daily at 6 PM.     . carvedilol (COREG) 6.25 MG tablet Take 1 tablet (6.25 mg total) by mouth 2 (two) times daily with a meal. 60 tablet 6  . ferrous gluconate (FERGON) 324 MG tablet Take 1 tablet (324 mg total) by mouth daily with breakfast. For one month then stop. (Patient taking differently: Take 324 mg by mouth 2 (two) times daily with a meal. For one month then stop.) 30 tablet 0  . folic acid (FOLVITE) 1 MG tablet Take 1 tablet (1 mg total) by mouth daily. For one month then stop.    Marland Kitchen lisinopril (PRINIVIL,ZESTRIL) 2.5 MG tablet Take 1 tablet (2.5 mg total) by mouth daily. (Patient taking differently: Take 5 mg by mouth. )    . PROAIR HFA 108 (90 BASE) MCG/ACT inhaler Inhale 1 puff into the lungs every 4 (four) hours as needed for wheezing or shortness of breath.     . SPIRIVA HANDIHALER 18 MCG inhalation capsule Place 18 mcg into inhaler and inhale daily.     Marland Kitchen spironolactone (ALDACTONE) 25 MG tablet Take 0.5 tablets (12.5 mg total) by mouth daily. 30 tablet 3   No current facility-administered medications for this encounter.   BP 110/60 mmHg  Pulse 98  Wt 156 lb 12.8 oz (71.124 kg)  SpO2 66%  General: NAD Neck: No JVD, no thyromegaly or thyroid nodule.  Lungs: Mildly prolonged expiratory phase.  CV: Distant heart sounds.  Heart regular, bradycardic, S1/S2, no S3/S4, 1/6 SEM RUSB.  No peripheral edema.  No carotid bruit.  Normal pedal pulses.  Abdomen: Soft, nontender, no hepatosplenomegaly, no distention.  Skin: Intact without lesions or rashes.  Neurologic: Alert and oriented x 3.  Psych: Normal affect. Extremities: No clubbing or cyanosis.  HEENT: Normal.  MSK: Joints not hypermobile.   Assessment/Plan: 1. Chronic systolic CHF: Nonischemic cardiomyopathy.  4/16 echo with EF 15-20%.  Possible tachycardia-mediated cardiomyopathy, cardiac MRI in 9/16 while in NSR showed EF up to 51%.  Coronary angiography 3/16 without significant CAD. TEE in 3/16 and cardiac MRI in  9/16 showed moderate AI but doubt AI contributes significantly to cardiomyopathy. Repeat echo post-op with EF 50% . Euvolemic on exam, NYHA class I-II. - He remains in NSR today.  - Continue spironolactone 12.5 mg daily, Coreg 6.25 mg bid, and lisinopril 2.5 mg bid.   - EF out of range for ICD.  - BMET today.  2. Atrial fibrillation: Paroxysmal.  First identified in 3/16.  He is now on Eliquis and amiodarone 200 mg daily.  He is in NSR today.  He had LA appendage clipping and Maze with Bentall in 11/16. He is in NSR today.   - Continue amiodarone 200 mg daily.  LFTs/TSH to be checked today.  Continue regular eye doctor followup given amiodarone use. - Ideally, he would  have continued Eliquis.  However, he really wanted to stop anticoagulation . We had an extended discussion about this.  He has had LA appendage ligation, but there is no data that this successfully prevents CVA.  I recommended that he continue anticoagulation, but he has wanted to avoid anticoagulation.  Therefore, he was started back on ASA 81 daily.  3. Aortic root dilation: Status post Bentall with ascending aorta/aortic root replacement.  4. Aortic insufficiency: He now has a bioprosthetic aortic valve.  This was well-seated on last echo. 5. COPD: Moderately severe obstructive lung disease by PFTs.  Has not smoked for years.  Had second hand smoke exposure.    [name] 06/11/2015

## 2015-06-30 DIAGNOSIS — I480 Paroxysmal atrial fibrillation: Secondary | ICD-10-CM | POA: Diagnosis not present

## 2015-06-30 DIAGNOSIS — E785 Hyperlipidemia, unspecified: Secondary | ICD-10-CM | POA: Diagnosis not present

## 2015-06-30 DIAGNOSIS — J449 Chronic obstructive pulmonary disease, unspecified: Secondary | ICD-10-CM | POA: Diagnosis not present

## 2015-06-30 DIAGNOSIS — Z952 Presence of prosthetic heart valve: Secondary | ICD-10-CM | POA: Diagnosis not present

## 2015-06-30 DIAGNOSIS — I119 Hypertensive heart disease without heart failure: Secondary | ICD-10-CM | POA: Diagnosis not present

## 2015-08-26 ENCOUNTER — Ambulatory Visit (INDEPENDENT_AMBULATORY_CARE_PROVIDER_SITE_OTHER): Payer: Medicare Other | Admitting: Pulmonary Disease

## 2015-08-26 ENCOUNTER — Encounter: Payer: Self-pay | Admitting: Pulmonary Disease

## 2015-08-26 VITALS — BP 112/64 | HR 55 | Ht 74.0 in | Wt 144.0 lb

## 2015-08-26 DIAGNOSIS — J309 Allergic rhinitis, unspecified: Secondary | ICD-10-CM | POA: Diagnosis not present

## 2015-08-26 DIAGNOSIS — J438 Other emphysema: Secondary | ICD-10-CM | POA: Diagnosis not present

## 2015-08-26 NOTE — Patient Instructions (Signed)
Continue taking your medications as you are doing Stay active, exercise regularly  For the sinus congestoin and cough Stage 1: Use Neil Med rinses with distilled water at least twice per day using the instructions on the package. Stage 2 (if no better with stage one) 1/2 hour after using the [facility] Med rinse, use Nasacort two puffs in each nostril once per day.  Remember that the Nasacort can take 1-2 weeks to work after regular use. Stage 3 (if no better with stage 3) Use generic zyrtec (cetirizine) every day.  If this doesn't help, then stop taking it and use chlorpheniramine-phenylephrine combination tablets.   F/u with me in one year

## 2015-08-26 NOTE — Assessment & Plan Note (Signed)
This has been more of a problem for him lately.  I recommended the following: Use Neil Med rinses with distilled water at least twice per day using the instructions on the package. 1/2 hour after using the [facility] Med rinse, use Nasacort two puffs in each nostril once per day.  Remember that the Nasacort can take 1-2 weeks to work after regular use. Use generic zyrtec (cetirizine) every day.  If this doesn't help, then stop taking it and use chlorpheniramine-phenylephrine combination tablets.

## 2015-08-26 NOTE — Progress Notes (Signed)
Subjective:    Patient ID: [name], male    DOB: 1940/08/08, 74 y.o.   MRN: [id number]  Synopsis: Referred in 2016 for evaluation of COPD. Has congestive heart failure. March 2016 pulmonary function testing ratio 61%, FEV1 2.48 L (66% predicted, 15% change with bronchodilation her), total lung capacity 7.96 L (101% predicted), residual volume 139% predicted), DLCO 25.46 (67% predicted) March 2016 CT chest images personally reviewed showing scattered blebs and very mild occasional paraseptal emphysema but otherwise normal pulmonary parenchyma, cardiomegaly was noted In 11/16, he had Bentall procedure with bioprosthetic aortic valve, LAA clipping, and Maze procedure. He has done well since then. Post-op echo also in 11/16 showed a normally functioning bioprosthetic aortic valve and EF 50%.   HPI Chief Complaint  Patient presents with  . Follow-up    Pt has been struggling with spring allergies- PND, clearing throat, sometimes prod cough with gray/white mucus.     [name] has done well since his Bentall procedure in November 2016.  He has lost about 65 pounds in the last year or two.   He has done well since his cardiac surgery.   He has been having trouble with sinus congesiton.  He has trouble with taste.  He has been having more allergy symptoms, hoarseness.  He has an occasional runny nose.  He does need to clear his throat a lot.   He is coughing up mucus, but he thinks it is more from his throat than from his lungs.    Past Medical History  Diagnosis Date  . A-fib ([place])   . Hypertension   . Asthma   . CHF (congestive heart failure) ([place])   . Seasonal allergies   . Cataracts, bilateral   . History of bronchitis   . Anxiety     Xanax PRN  . Arthritis   . History of stomach ulcers       Review of Systems     Objective:   Physical Exam  Filed Vitals:   08/26/15 1020  BP: 112/64  Pulse: 55  Height: 6\' 2"  (1.88 m)  Weight: 144 lb (65.318 kg)  SpO2: 100%    RA  Gen: tall, thin, well appearing HENT: OP clear, TM's clear, neck supple PULM: CTA B, normal percussion CV: RRR, no mgr, trace edema GI: BS+, soft, nontender Derm: no cyanosis or rash, midline scar well healed Psyche: normal mood and affect       Assessment & Plan:  COPD (chronic obstructive pulmonary disease) ([place]) This has been a fairly stable interval for him in terms of his COPD.  He has not had any recent exacerbations.  He has done well since his chest surgery without difficulty.  He has done OK since stopping the Spiriva but he continues to take the Advair which is fine.  Plan: Continue Advair Exercise regulalry F/u one year  Allergic rhinitis This has been more of a problem for him lately.  I recommended the following: Use Neil Med rinses with distilled water at least twice per day using the instructions on the package. 1/2 hour after using the [facility] Med rinse, use Nasacort two puffs in each nostril once per day.  Remember that the Nasacort can take 1-2 weeks to work after regular use. Use generic zyrtec (cetirizine) every day.  If this doesn't help, then stop taking it and use chlorpheniramine-phenylephrine combination tablets.       Current outpatient prescriptions:  .  ADVAIR DISKUS 250-50 MCG/DOSE AEPB, Inhale 1 puff  into the lungs 2 (two) times daily. , Disp: , Rfl:  .  ALPRAZolam (XANAX) 0.25 MG tablet, Take 0.25 mg by mouth 2 (two) times daily as needed for anxiety or sleep. , Disp: , Rfl:  .  amiodarone (PACERONE) 200 MG tablet, Take 1 tablet (200 mg total) by mouth daily., Disp: 30 tablet, Rfl: 3 .  aspirin 81 MG chewable tablet, Chew 1 tablet (81 mg total) by mouth daily., Disp: , Rfl:  .  atorvastatin (LIPITOR) 20 MG tablet, Take 20 mg by mouth daily at 6 PM. , Disp: , Rfl:  .  carvedilol (COREG) 6.25 MG tablet, Take 1 tablet (6.25 mg total) by mouth 2 (two) times daily with a meal., Disp: 60 tablet, Rfl: 6 .  lisinopril (PRINIVIL,ZESTRIL) 2.5 MG  tablet, Take 1 tablet (2.5 mg total) by mouth daily. (Patient taking differently: Take 2.5 mg by mouth 2 (two) times daily. ), Disp: , Rfl:  .  PROAIR HFA 108 (90 BASE) MCG/ACT inhaler, Inhale 1 puff into the lungs every 4 (four) hours as needed for wheezing or shortness of breath. , Disp: , Rfl:  .  spironolactone (ALDACTONE) 25 MG tablet, Take 0.5 tablets (12.5 mg total) by mouth daily., Disp: 30 tablet, Rfl: 3

## 2015-08-26 NOTE — Assessment & Plan Note (Signed)
This has been a fairly stable interval for him in terms of his COPD.  He has not had any recent exacerbations.  He has done well since his chest surgery without difficulty.  He has done OK since stopping the Spiriva but he continues to take the Advair which is fine.  Plan: Continue Advair Exercise regulalry F/u one year

## 2015-09-09 ENCOUNTER — Ambulatory Visit (INDEPENDENT_AMBULATORY_CARE_PROVIDER_SITE_OTHER): Payer: Medicare Other | Admitting: Cardiothoracic Surgery

## 2015-09-09 ENCOUNTER — Encounter: Payer: Self-pay | Admitting: Cardiothoracic Surgery

## 2015-09-09 VITALS — BP 101/56 | HR 61 | Resp 16 | Ht 74.0 in | Wt 144.0 lb

## 2015-09-09 DIAGNOSIS — Z8679 Personal history of other diseases of the circulatory system: Secondary | ICD-10-CM

## 2015-09-09 DIAGNOSIS — Z952 Presence of prosthetic heart valve: Secondary | ICD-10-CM

## 2015-09-09 DIAGNOSIS — Z954 Presence of other heart-valve replacement: Secondary | ICD-10-CM

## 2015-09-09 DIAGNOSIS — Z9889 Other specified postprocedural states: Secondary | ICD-10-CM

## 2015-09-10 NOTE — Progress Notes (Signed)
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 SURGICAL PROCEDURE: Biologic Bentall with aortic root replacement and reimplantation of coronary arteries, replacement of aortic valve and root with pericardial tissue valve [employer] model 3300TFX 27 mm serial [id number] and 30 mm Valsalva graft and pulmonary vein MAZE procedure with left-sided pulmonary isolation lesions, placement of 35 mm atrial clip. SURGEON: Lanelle Bal, MD   History of Present Illness:      Patient doing well after surgery, back to normal activities without evidence of congestive heart failure. He's unaware of any recurrence of atrial fibrillation. He is  living independently at home .   Past Medical History  Diagnosis Date  . A-fib ([place])   . Hypertension   . Asthma   . CHF (congestive heart failure) ([place])   . Seasonal allergies   . Cataracts, bilateral   . History of bronchitis   . Anxiety     Xanax PRN  . Arthritis   . History of stomach ulcers      History  Smoking status  . Passive Smoke Exposure - Never Smoker  Smokeless tobacco  . Never Used    History  Alcohol Use No     No Known Allergies  Current Outpatient Prescriptions  Medication Sig Dispense Refill  . ADVAIR DISKUS 250-50 MCG/DOSE AEPB Inhale 1 puff into the lungs 2 (two) times daily.     Marland Kitchen ALPRAZolam (XANAX) 0.25 MG tablet Take 0.25 mg by mouth 2 (two) times daily as needed for  anxiety or sleep.     Marland Kitchen amiodarone (PACERONE) 200 MG tablet Take 1 tablet (200 mg total) by mouth daily. 30 tablet 3  . aspirin 81 MG chewable tablet Chew 1 tablet (81 mg total) by mouth daily.    Marland Kitchen atorvastatin (LIPITOR) 20 MG tablet Take 20 mg by mouth daily at 6 PM.     . carvedilol (COREG) 6.25 MG tablet Take 1 tablet (6.25 mg total) by mouth 2 (two) times daily with a meal. 60 tablet 6  . lisinopril (PRINIVIL,ZESTRIL) 2.5 MG tablet Take 1 tablet (2.5 mg total) by mouth daily. (Patient taking differently: Take 2.5 mg by mouth 2 (two) times daily. )    . PROAIR HFA 108 (90 BASE) MCG/ACT inhaler Inhale 1 puff into the lungs every 4 (four) hours as needed for wheezing or shortness of breath.     . spironolactone (ALDACTONE) 25 MG tablet Take 0.5 tablets (12.5 mg total) by mouth daily. 30 tablet 3   No current facility-administered medications for this visit.       Physical Exam: BP 101/56 mmHg  Pulse 61  Resp 16  Ht 6\' 2"  (1.88 m)  Wt 144 lb (65.318 kg)  BMI 18.48 kg/m2  SpO2 99%  General appearance: alert and cooperative Neurologic: intact Heart: regular rate and rhythm, S1, S2 normal, no murmur, click, rub or gallop Lungs: clear to auscultation bilaterally Abdomen: soft, non-tender; bowel sounds normal; no masses,  no  organomegaly Extremities: extremities normal, atraumatic, no cyanosis or edema and Homans sign is negative, no sign of DVT Wound: Sternum is stable and well-healed he has no murmur of aortic insufficiency  Diagnostic Studies & Laboratory data:     Recent Radiology Findings:   No results found.    Recent Lab Findings: Lab Results  Component Value Date   WBC 7.2 06/10/2015   HGB 11.6* 06/10/2015   HCT 36.2* 06/10/2015   PLT 183 06/10/2015   GLUCOSE 128* 06/10/2015   ALT 27 06/10/2015   AST 25 06/10/2015   NA 134* 06/10/2015   K 4.0 06/10/2015   CL 101 06/10/2015   CREATININE 0.90 06/10/2015   BUN 15 06/10/2015   CO2 22 06/10/2015   TSH 2.631  06/10/2015   INR 2.18* 03/17/2015   HGBA1C 6.1* 03/15/2015      Assessment / Plan:    Patient making good progress postoperatively after biologic Bentall and Maze procedure. He appears to be remaining in sinus rhythm without evidence of congestive heart failure. I again reviewed with him the need for antibiotic prophylaxis prior to dental or other invasive procedures We'll plan to see him back in 6  Months and get cta of chest one year follow from surgery    Grace Isaac MD      [address] [place] [address] Office [phone]   Beeper [phone]  09/10/2015 7:14 AM

## 2015-09-15 ENCOUNTER — Other Ambulatory Visit: Payer: Self-pay | Admitting: Cardiology

## 2015-10-04 DIAGNOSIS — J449 Chronic obstructive pulmonary disease, unspecified: Secondary | ICD-10-CM | POA: Diagnosis not present

## 2015-10-04 DIAGNOSIS — R42 Dizziness and giddiness: Secondary | ICD-10-CM | POA: Diagnosis not present

## 2015-10-04 DIAGNOSIS — Z952 Presence of prosthetic heart valve: Secondary | ICD-10-CM | POA: Diagnosis not present

## 2015-10-04 DIAGNOSIS — I480 Paroxysmal atrial fibrillation: Secondary | ICD-10-CM | POA: Diagnosis not present

## 2015-10-04 DIAGNOSIS — E785 Hyperlipidemia, unspecified: Secondary | ICD-10-CM | POA: Diagnosis not present

## 2015-10-04 DIAGNOSIS — D649 Anemia, unspecified: Secondary | ICD-10-CM | POA: Diagnosis not present

## 2015-10-04 DIAGNOSIS — I119 Hypertensive heart disease without heart failure: Secondary | ICD-10-CM | POA: Diagnosis not present

## 2015-12-21 DIAGNOSIS — E785 Hyperlipidemia, unspecified: Secondary | ICD-10-CM | POA: Diagnosis not present

## 2015-12-21 DIAGNOSIS — I119 Hypertensive heart disease without heart failure: Secondary | ICD-10-CM | POA: Diagnosis not present

## 2015-12-21 DIAGNOSIS — Z952 Presence of prosthetic heart valve: Secondary | ICD-10-CM | POA: Diagnosis not present

## 2015-12-21 DIAGNOSIS — J449 Chronic obstructive pulmonary disease, unspecified: Secondary | ICD-10-CM | POA: Diagnosis not present

## 2015-12-21 DIAGNOSIS — I480 Paroxysmal atrial fibrillation: Secondary | ICD-10-CM | POA: Diagnosis not present

## 2015-12-23 DIAGNOSIS — I480 Paroxysmal atrial fibrillation: Secondary | ICD-10-CM | POA: Diagnosis not present

## 2015-12-23 DIAGNOSIS — Z952 Presence of prosthetic heart valve: Secondary | ICD-10-CM | POA: Diagnosis not present

## 2015-12-23 DIAGNOSIS — D649 Anemia, unspecified: Secondary | ICD-10-CM | POA: Diagnosis not present

## 2015-12-23 DIAGNOSIS — I119 Hypertensive heart disease without heart failure: Secondary | ICD-10-CM | POA: Diagnosis not present

## 2015-12-23 DIAGNOSIS — E785 Hyperlipidemia, unspecified: Secondary | ICD-10-CM | POA: Diagnosis not present

## 2016-02-02 DIAGNOSIS — Z23 Encounter for immunization: Secondary | ICD-10-CM | POA: Diagnosis not present

## 2016-02-08 DIAGNOSIS — D649 Anemia, unspecified: Secondary | ICD-10-CM | POA: Diagnosis not present

## 2016-02-08 DIAGNOSIS — R0789 Other chest pain: Secondary | ICD-10-CM | POA: Diagnosis not present

## 2016-02-08 DIAGNOSIS — E785 Hyperlipidemia, unspecified: Secondary | ICD-10-CM | POA: Diagnosis not present

## 2016-02-08 DIAGNOSIS — I480 Paroxysmal atrial fibrillation: Secondary | ICD-10-CM | POA: Diagnosis not present

## 2016-02-08 DIAGNOSIS — I119 Hypertensive heart disease without heart failure: Secondary | ICD-10-CM | POA: Diagnosis not present

## 2016-02-08 DIAGNOSIS — J449 Chronic obstructive pulmonary disease, unspecified: Secondary | ICD-10-CM | POA: Diagnosis not present

## 2016-02-24 ENCOUNTER — Other Ambulatory Visit: Payer: Self-pay | Admitting: Cardiothoracic Surgery

## 2016-02-24 DIAGNOSIS — I7121 Aneurysm of the ascending aorta, without rupture: Secondary | ICD-10-CM

## 2016-02-24 DIAGNOSIS — I719 Aortic aneurysm of unspecified site, without rupture: Secondary | ICD-10-CM

## 2016-03-27 ENCOUNTER — Other Ambulatory Visit: Payer: Self-pay | Admitting: Cardiothoracic Surgery

## 2016-03-30 ENCOUNTER — Encounter: Payer: Self-pay | Admitting: Cardiothoracic Surgery

## 2016-03-30 ENCOUNTER — Ambulatory Visit (INDEPENDENT_AMBULATORY_CARE_PROVIDER_SITE_OTHER): Payer: Medicare Other | Admitting: Cardiothoracic Surgery

## 2016-03-30 ENCOUNTER — Ambulatory Visit
Admission: RE | Admit: 2016-03-30 | Discharge: 2016-03-30 | Disposition: A | Discharge status: Home / Self Care | Payer: Medicare Other | Source: Ambulatory Visit | Attending: Cardiothoracic Surgery | Admitting: Cardiothoracic Surgery

## 2016-03-30 VITALS — BP 139/80 | HR 67 | Resp 20 | Ht 74.0 in | Wt 140.0 lb

## 2016-03-30 DIAGNOSIS — Z952 Presence of prosthetic heart valve: Secondary | ICD-10-CM | POA: Diagnosis not present

## 2016-03-30 DIAGNOSIS — I719 Aortic aneurysm of unspecified site, without rupture: Secondary | ICD-10-CM

## 2016-03-30 DIAGNOSIS — I712 Thoracic aortic aneurysm, without rupture: Secondary | ICD-10-CM | POA: Diagnosis not present

## 2016-03-30 DIAGNOSIS — Z8679 Personal history of other diseases of the circulatory system: Secondary | ICD-10-CM

## 2016-03-30 DIAGNOSIS — I7121 Aneurysm of the ascending aorta, without rupture: Secondary | ICD-10-CM

## 2016-03-30 DIAGNOSIS — Z9889 Other specified postprocedural states: Secondary | ICD-10-CM

## 2016-03-30 MED ORDER — IOPAMIDOL (ISOVUE-370) INJECTION 76%
75.0000 mL | Freq: Once | INTRAVENOUS | Status: AC | PRN
  Administered 2016-03-30: 75 mL via INTRAVENOUS

## 2016-03-30 NOTE — Progress Notes (Signed)
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 SURGICAL PROCEDURE: Biologic Bentall with aortic root replacement and reimplantation of coronary arteries, replacement of aortic valve and root with pericardial tissue valve [employer] model 3300TFX 27 mm serial [id number] and 30 mm Valsalva graft and pulmonary vein MAZE procedure with left-sided pulmonary isolation lesions, placement of 35 mm atrial clip. SURGEON: Lanelle Bal, MD   History of Present Illness:      Patient doing well after surgery, back to normal activities without evidence of congestive heart failure. He's unaware of any recurrence of atrial fibrillation. He is  living independently at home . Notes that his weight has been stable at 140 pounds. He walks 5 miles every day without difficulty    Past Medical History:  Diagnosis Date  . A-fib ([place])   . Anxiety    Xanax PRN  . Arthritis   . Asthma   . Cataracts, bilateral   . CHF (congestive heart failure) ([place])   . History of bronchitis   . History of stomach ulcers   . Hypertension   . Seasonal allergies      History  Smoking Status  . Passive Smoke Exposure - Never Smoker  Smokeless Tobacco  . Never Used    History  Alcohol Use No     No Known Allergies  Current Outpatient Prescriptions  Medication Sig Dispense Refill  . ADVAIR DISKUS 250-50 MCG/DOSE AEPB Inhale 1 puff into the lungs 2 (two) times daily.      Marland Kitchen ALPRAZolam (XANAX) 0.25 MG tablet Take 0.25 mg by mouth 2 (two) times daily as needed for anxiety or sleep.     Marland Kitchen amiodarone (PACERONE) 200 MG tablet Take 1 tablet (200 mg total) by mouth daily. (Patient taking differently: Take 100 mg by mouth daily. ) 30 tablet 3  . aspirin 81 MG chewable tablet Chew 1 tablet (81 mg total) by mouth daily.    Marland Kitchen atorvastatin (LIPITOR) 20 MG tablet Take 20 mg by mouth daily at 6 PM.     . carvedilol (COREG) 6.25 MG tablet Take 1 tablet (6.25 mg total) by mouth 2 (two) times daily with a meal. 60 tablet 6  . lisinopril (PRINIVIL,ZESTRIL) 5 MG tablet Take 0.5 tablets (2.5 mg total) by mouth 2 (two) times daily. 30 tablet 3  . PROAIR HFA 108 (90 BASE) MCG/ACT inhaler Inhale 1 puff into the lungs every 4 (four) hours as needed for wheezing or shortness of breath.     . spironolactone (ALDACTONE) 25 MG tablet Take 0.5 tablets (12.5 mg total) by mouth daily. (Patient taking differently: Take 12.5 mg by mouth daily as needed. ) 30 tablet 3   No current facility-administered medications for this visit.        Physical Exam: BP 139/80   Pulse 67   Resp 20   Ht 6\' 2"  (1.88 m)   Wt 140 lb (63.5 kg) Comment: this am at home  SpO2 97% Comment:  RA  BMI 17.97 kg/m   General appearance: alert and cooperative Neurologic: intact Heart: regular rate and rhythm, S1, S2 normal, no murmur, click, rub or gallop Lungs: clear to auscultation bilaterally Abdomen: soft, non-tender; bowel sounds normal; no masses,  no organomegaly Extremities: extremities normal, atraumatic, no cyanosis or edema and Homans sign is negative, no sign of DVT Wound: Sternum is stable and well-healed he has no murmur of aortic insufficiency  Diagnostic Studies & Laboratory data:     Recent Radiology Findings:   Ct Angio Chest Aorta W/cm &/or Wo/cm  Result Date: 03/30/2016 CLINICAL DATA:  75 year old male with a history of thoracic aortic disease with ascending aneurysm, dilated root,  aortic insufficiency and atrial fibrillation. He is 1 year status Bentall procedure with root replacement, aortic valve replacement, Maze procedure, and left atrial amputation, performed 03/17/2015. EXAM: CT ANGIOGRAPHY CHEST WITH CONTRAST TECHNIQUE: Multidetector CT imaging of the chest was performed using the standard protocol during bolus administration of intravenous contrast. Multiplanar CT image reconstructions and MIPs were obtained to evaluate the vascular anatomy. CONTRAST:  75 cc Isovue 370 COMPARISON:  MRI chest 01/06/2015, chest CT 07/10/2014 FINDINGS: Cardiovascular: Postoperative changes of median sternotomy, aortic root replacement, aortic valve replacement with re- implantation, and left atrial amputation with a clip. Unremarkable appearance of the surgical graft site. Cardiac: Calcifications of the native coronary vasculature involving left main, left anterior descending coronary arteries. Note that there is a tented appearance of the anterior surface of the right ventricle, which is intimately adjacent to surgical changes of median sternotomy at the lower sternum. No pericardial fluid/thickening. Status post repair of the ascending aorta, with a diameter of native aorta just beyond the surgical anastomosis measuring 3.5 cm. Calcifications of the aortic arch. Three vessel arch. Calcifications of descending thoracic aorta. Greatest diameter of descending thoracic aorta measures approximately 2.8 cm. Pulmonary arterial tree not evaluated given the timing of the contrast bolus. Mediastinum/Nodes: Unremarkable appearance of the thoracic inlet and thyroid. No adenopathy of the mediastinum. Central airways are clear. Calcifications of mediastinal lymph nodes is unchanged. Lungs/Pleura: Similar appearance of centrilobular/ paraseptal emphysema with mild architectural distortion. Calcified granuloma of the right lung base is unchanged. No confluent airspace disease, pneumothorax, or pleural effusion. No  endotracheal debris. Previous identified right upper lobe nodule is not appreciated on today's CT scan. Upper Abdomen: Coarsened calcifications of spleen, compatible with prior granulomatous disease. Musculoskeletal: No displaced fracture. Degenerative changes of the visualized spine. No bony canal narrowing. Review of the MIP images confirms the above findings. IMPRESSION: Interval postsurgical changes of median sternotomy, aortic replacement, aortic valve replacement with reimplantation, and left atrial amputation. Note that the free wall of the right ventricle is tented towards the median sternotomy surgical changes, which may be a consideration if redo sternotomy is ever warranted. Native coronary calcifications. Re- demonstration of paraseptal and centrilobular emphysema. Changes compatible with prior granulomatous disease, including calcified granuloma of the right lower lobe. No other lung nodules are identified on today's CT which would prompt surveillance. Signed, Dulcy Fanny. Earleen Newport, DO [facility] Radiology Electronically Signed   By: Corrie Mckusick D.O.   On: 03/30/2016 13:16    I have independently reviewed the above radiology studies  and reviewed the findings with the patient.   Recent Lab Findings: Lab Results  Component Value Date   WBC 7.2 06/10/2015   HGB 11.6 (L) 06/10/2015   HCT 36.2 (L) 06/10/2015   PLT 183 06/10/2015   GLUCOSE 128 (H) 06/10/2015  ALT 27 06/10/2015   AST 25 06/10/2015   NA 134 (L) 06/10/2015   K 4.0 06/10/2015   CL 101 06/10/2015   CREATININE 0.90 06/10/2015   BUN 15 06/10/2015   CO2 22 06/10/2015   TSH 2.631 06/10/2015   INR 2.18 (H) 03/17/2015   HGBA1C 6.1 (H) 03/15/2015      Assessment / Plan:    Patient making good progress postoperatively after biologic Bentall and Maze procedure. He appears to be remaining in sinus rhythm without evidence of congestive heart failure. I again reviewed with him the  need for antibiotic prophylaxis prior to dental or other invasive procedures Ct today Interval postsurgical changes of median sternotomy, aortic replacement, aortic valve replacement with reimplantation, and left atrial amputation CT today: Changes compatible with prior granulomatous disease, including calcified granuloma of the right lower lobe. No other lung nodules are identified on today's CT which would prompt surveillance Patient doing well following previous biologic Bentall and Maze procedure without evidence of congestive heart failure, CTA of the chest shows his previous descending aortic replacement intact, there is been no changes in any lung nodules. I have not made him a return appointment to see me as he's closely followed by cardiology  Grace Isaac MD      [address] [place] [address] Office [phone]   Beeper [phone]  03/30/2016 1:36 PM

## 2016-04-11 DIAGNOSIS — H35373 Puckering of macula, bilateral: Secondary | ICD-10-CM | POA: Diagnosis not present

## 2016-04-11 DIAGNOSIS — H2513 Age-related nuclear cataract, bilateral: Secondary | ICD-10-CM | POA: Diagnosis not present

## 2016-05-09 IMAGING — CR DG CHEST 2V
1 series · 2 of 2 positions shown · non-contrast
Comparison: 04/15/2012

CLINICAL DATA: Cough, congestion, chest pain, shortness of breath
for 4 days.

EXAM:
CHEST  2 VIEW

[Series 1: w chest pa · 0.14mm/px · 2 of 2 slices shown]
[im 1/2]
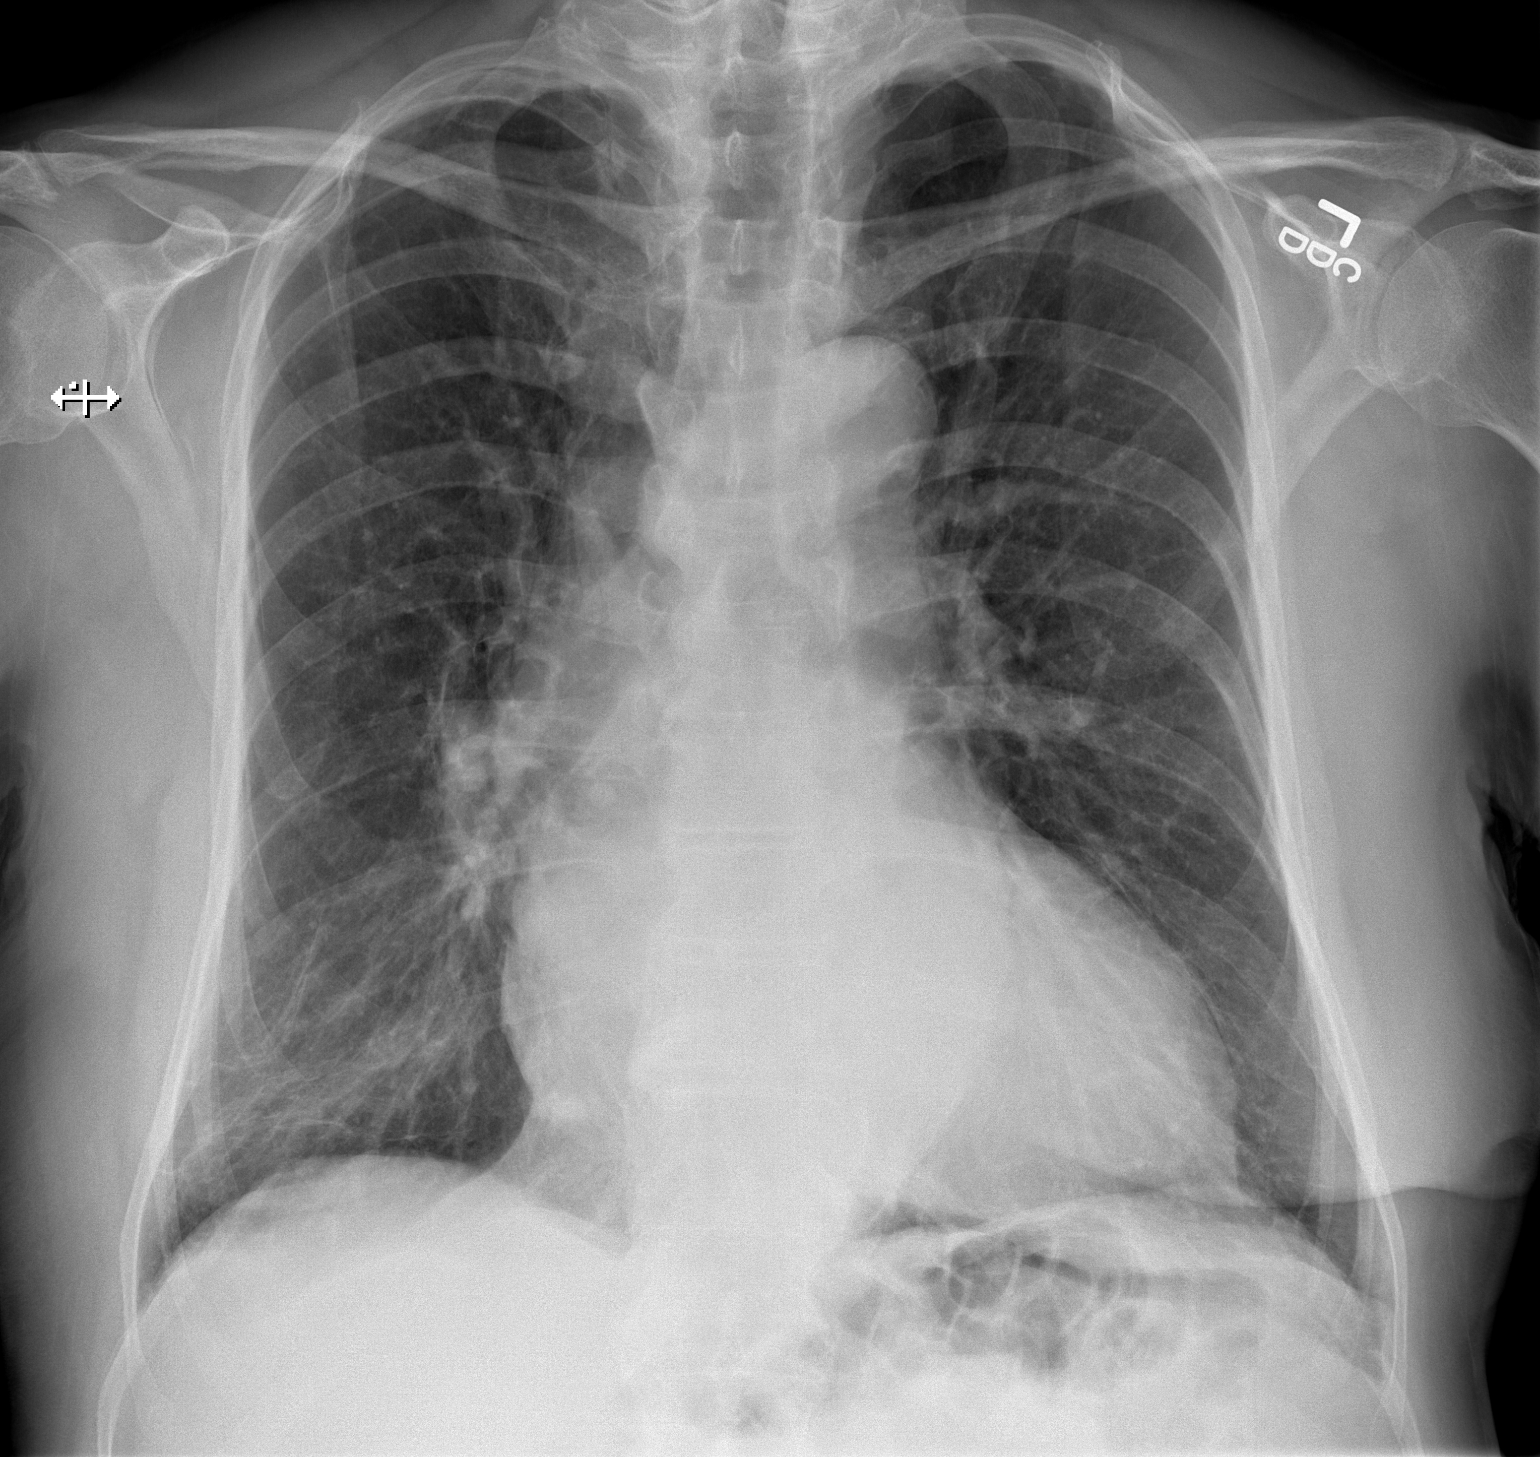
[im 2/2]
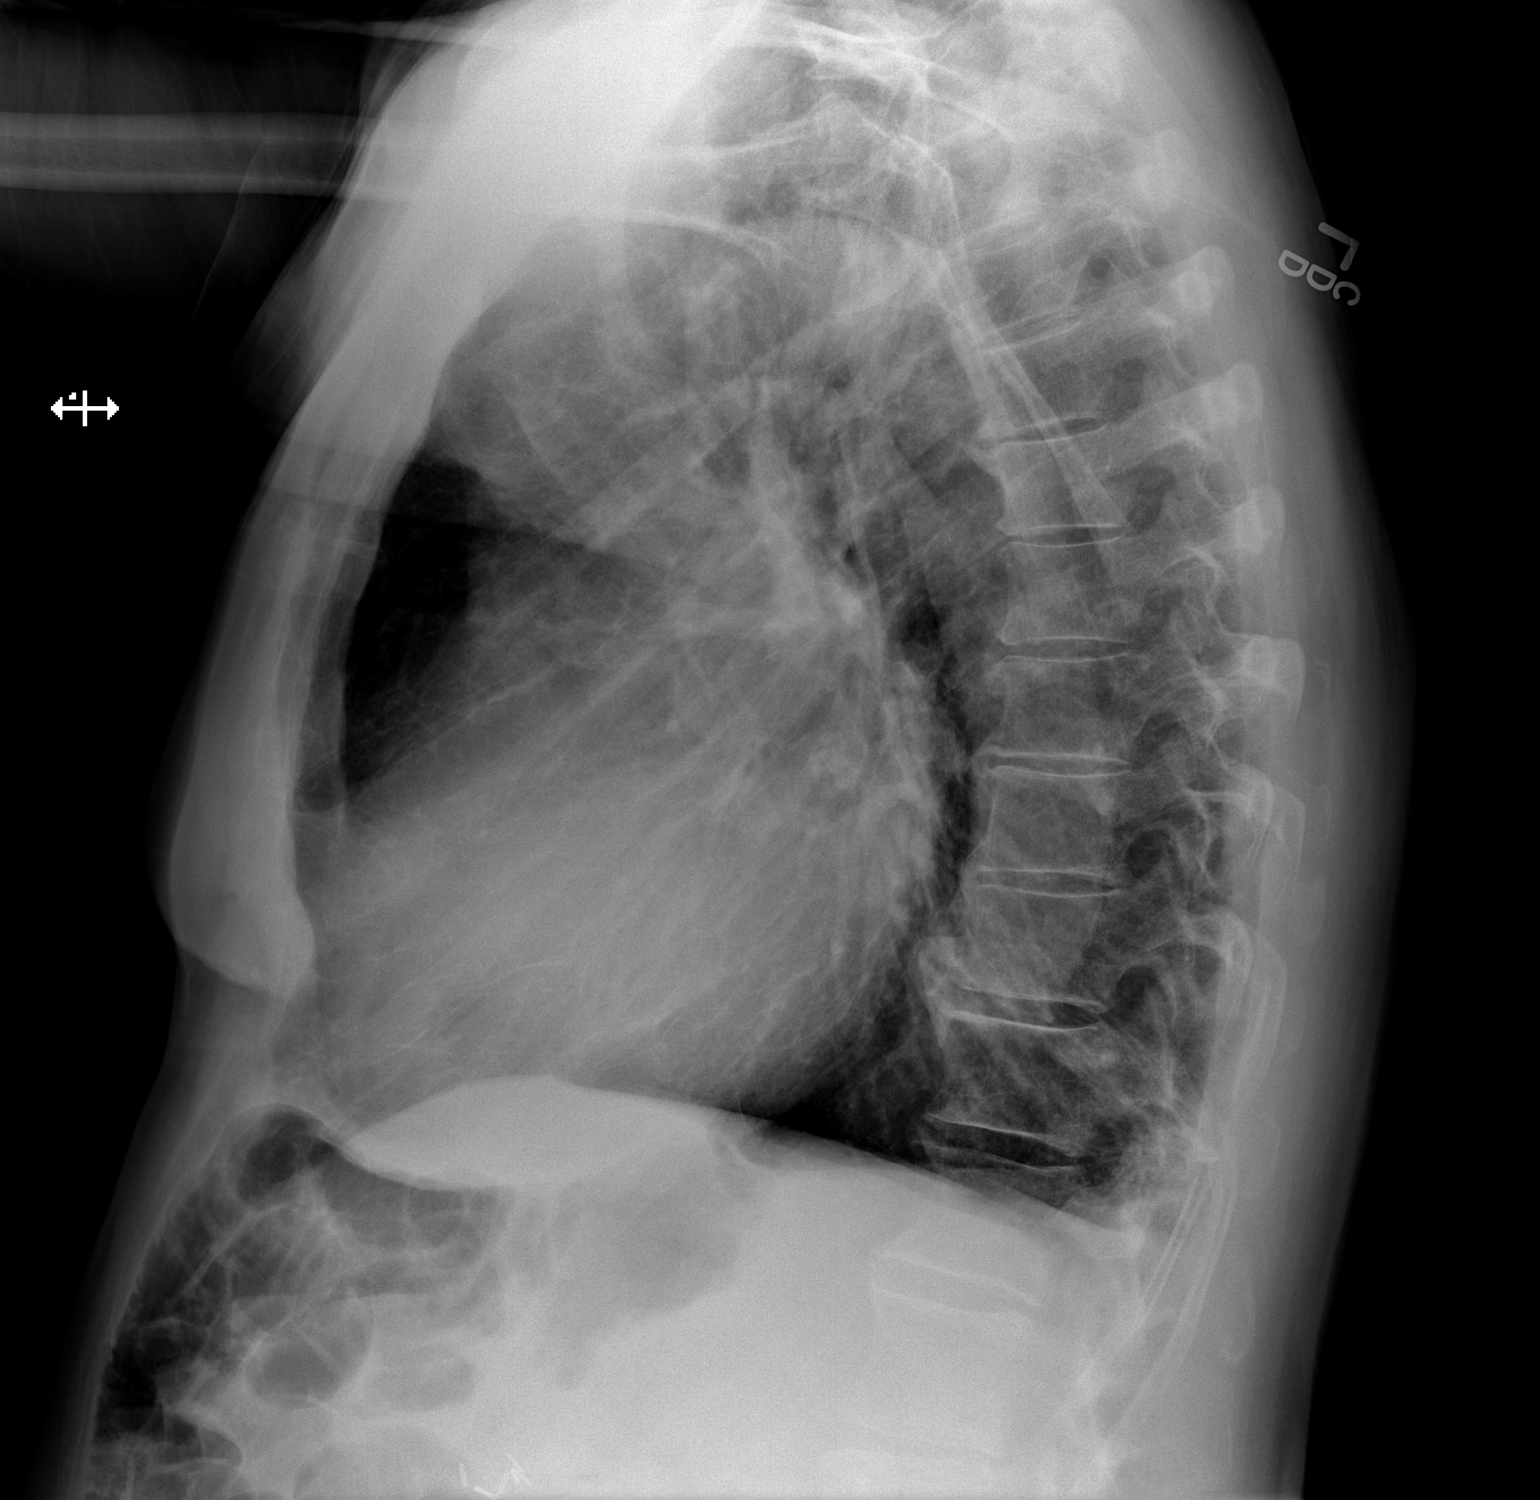

[2 of 2 positions shown; findings below may reference images not displayed]

FINDINGS: There is hyperinflation of the lungs compatible with COPD.
Cardiomegaly. Chronic scarring in the apices and lung bases. No
acute airspace opacities or effusions. No acute bony abnormality.
IMPRESSION: COPD/chronic changes.  No active disease.

## 2016-05-13 IMAGING — CR DG CHEST 1V PORT
1 series · 1 of 1 positions shown · non-contrast
Comparison: Remote frontal and lateral views 10/23/2007

CLINICAL DATA: Shortness of breath and chest discomfort.

EXAM:
PORTABLE CHEST - 1 VIEW

[AP]
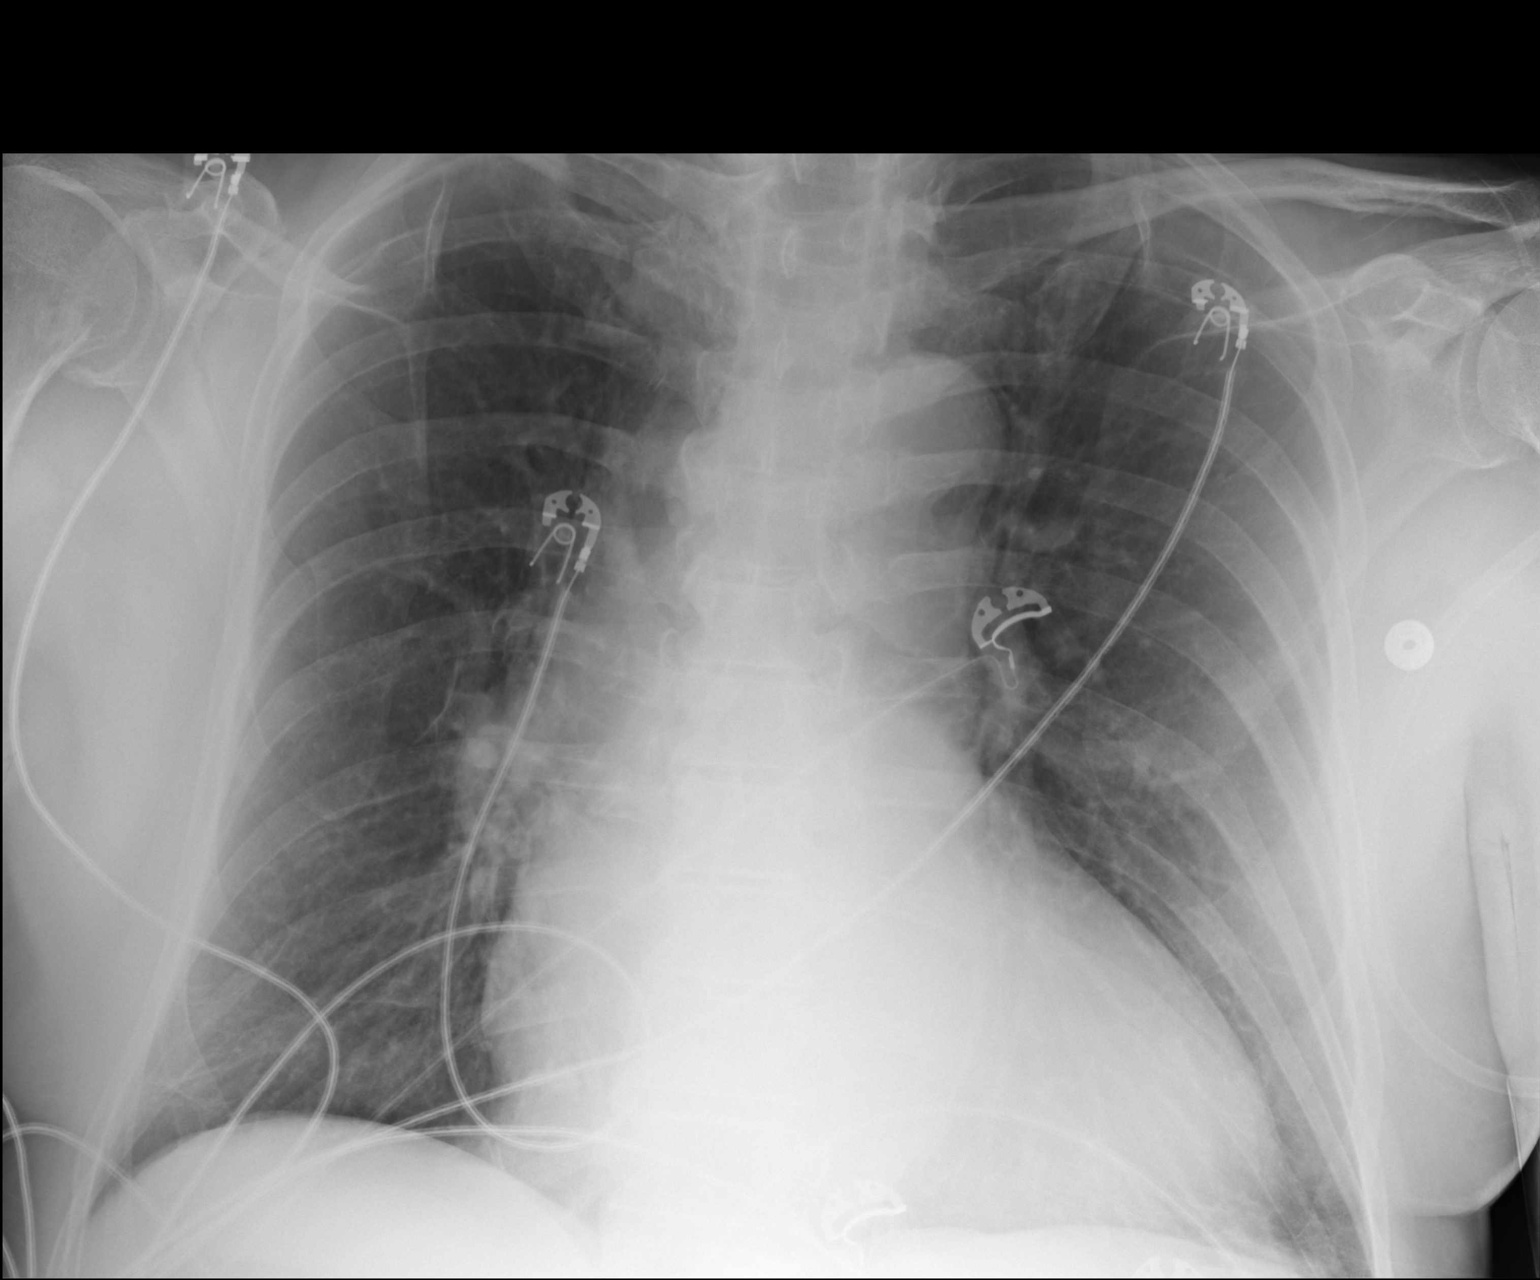

[1 of 1 positions shown; findings below may reference images not displayed]

FINDINGS: The heart is enlarged. Mild prominence of central pulmonary
vasculature without pulmonary edema. Lungs remain hyperinflated. No
consolidation, pleural effusion, or pneumothorax. Minimal
atelectasis or scarring at the left lung base.
IMPRESSION: 1. Mild cardiomegaly.
2. Hyperinflation with left basilar atelectasis.

## 2016-05-22 DIAGNOSIS — Z952 Presence of prosthetic heart valve: Secondary | ICD-10-CM | POA: Diagnosis not present

## 2016-05-22 DIAGNOSIS — I480 Paroxysmal atrial fibrillation: Secondary | ICD-10-CM | POA: Diagnosis not present

## 2016-05-22 DIAGNOSIS — E785 Hyperlipidemia, unspecified: Secondary | ICD-10-CM | POA: Diagnosis not present

## 2016-05-22 DIAGNOSIS — I119 Hypertensive heart disease without heart failure: Secondary | ICD-10-CM | POA: Diagnosis not present

## 2016-05-22 DIAGNOSIS — D649 Anemia, unspecified: Secondary | ICD-10-CM | POA: Diagnosis not present

## 2016-08-21 DIAGNOSIS — J449 Chronic obstructive pulmonary disease, unspecified: Secondary | ICD-10-CM | POA: Diagnosis not present

## 2016-08-21 DIAGNOSIS — I480 Paroxysmal atrial fibrillation: Secondary | ICD-10-CM | POA: Diagnosis not present

## 2016-08-21 DIAGNOSIS — E785 Hyperlipidemia, unspecified: Secondary | ICD-10-CM | POA: Diagnosis not present

## 2016-08-21 DIAGNOSIS — Z952 Presence of prosthetic heart valve: Secondary | ICD-10-CM | POA: Diagnosis not present

## 2016-08-21 DIAGNOSIS — I119 Hypertensive heart disease without heart failure: Secondary | ICD-10-CM | POA: Diagnosis not present

## 2016-08-24 DIAGNOSIS — E785 Hyperlipidemia, unspecified: Secondary | ICD-10-CM | POA: Diagnosis not present

## 2016-08-24 DIAGNOSIS — I10 Essential (primary) hypertension: Secondary | ICD-10-CM | POA: Diagnosis not present

## 2016-09-14 DIAGNOSIS — H2513 Age-related nuclear cataract, bilateral: Secondary | ICD-10-CM | POA: Diagnosis not present

## 2016-09-26 DIAGNOSIS — H2513 Age-related nuclear cataract, bilateral: Secondary | ICD-10-CM | POA: Diagnosis not present

## 2016-09-27 ENCOUNTER — Encounter: Payer: Self-pay | Admitting: *Deleted

## 2016-09-28 NOTE — Discharge Instructions (Signed)

## 2016-10-04 ENCOUNTER — Ambulatory Visit: Payer: Medicare Other | Admitting: Anesthesiology

## 2016-10-04 ENCOUNTER — Ambulatory Visit
Admission: RE | Admit: 2016-10-04 | Discharge: 2016-10-04 | Disposition: A | Discharge status: Home / Self Care | Payer: Medicare Other | Source: Ambulatory Visit | Attending: Ophthalmology | Admitting: Ophthalmology

## 2016-10-04 ENCOUNTER — Encounter
Admission: RE | Disposition: A | Discharge status: Home / Self Care | Payer: Self-pay | Source: Ambulatory Visit | Attending: Ophthalmology

## 2016-10-04 DIAGNOSIS — E039 Hypothyroidism, unspecified: Secondary | ICD-10-CM | POA: Diagnosis not present

## 2016-10-04 DIAGNOSIS — I110 Hypertensive heart disease with heart failure: Secondary | ICD-10-CM | POA: Diagnosis not present

## 2016-10-04 DIAGNOSIS — H2513 Age-related nuclear cataract, bilateral: Secondary | ICD-10-CM | POA: Diagnosis not present

## 2016-10-04 DIAGNOSIS — Z952 Presence of prosthetic heart valve: Secondary | ICD-10-CM | POA: Diagnosis not present

## 2016-10-04 DIAGNOSIS — F419 Anxiety disorder, unspecified: Secondary | ICD-10-CM | POA: Insufficient documentation

## 2016-10-04 DIAGNOSIS — E7800 Pure hypercholesterolemia, unspecified: Secondary | ICD-10-CM | POA: Diagnosis not present

## 2016-10-04 DIAGNOSIS — I509 Heart failure, unspecified: Secondary | ICD-10-CM | POA: Insufficient documentation

## 2016-10-04 DIAGNOSIS — M1990 Unspecified osteoarthritis, unspecified site: Secondary | ICD-10-CM | POA: Diagnosis not present

## 2016-10-04 DIAGNOSIS — I2510 Atherosclerotic heart disease of native coronary artery without angina pectoris: Secondary | ICD-10-CM | POA: Diagnosis not present

## 2016-10-04 DIAGNOSIS — J449 Chronic obstructive pulmonary disease, unspecified: Secondary | ICD-10-CM | POA: Insufficient documentation

## 2016-10-04 DIAGNOSIS — H2511 Age-related nuclear cataract, right eye: Secondary | ICD-10-CM | POA: Diagnosis not present

## 2016-10-04 DIAGNOSIS — I4891 Unspecified atrial fibrillation: Secondary | ICD-10-CM | POA: Diagnosis not present

## 2016-10-04 DIAGNOSIS — G4730 Sleep apnea, unspecified: Secondary | ICD-10-CM | POA: Insufficient documentation

## 2016-10-04 HISTORY — DX: Hypothyroidism, unspecified: E03.9

## 2016-10-04 HISTORY — DX: Presence of dental prosthetic device (complete) (partial): Z97.2

## 2016-10-04 HISTORY — PX: CATARACT EXTRACTION W/PHACO: SHX586

## 2016-10-04 SURGERY — PHACOEMULSIFICATION, CATARACT, WITH IOL INSERTION
Anesthesia: Monitor Anesthesia Care | Site: Eye | Laterality: Right | Wound class: Clean

## 2016-10-04 MED ORDER — MIDAZOLAM HCL 2 MG/2ML IJ SOLN
INTRAMUSCULAR | Status: DC | PRN
  Administered 2016-10-04: 2 mg via INTRAVENOUS

## 2016-10-04 MED ORDER — LIDOCAINE HCL (PF) 2 % IJ SOLN
INTRAOCULAR | Status: DC | PRN
  Administered 2016-10-04: 1 mL via INTRAOCULAR

## 2016-10-04 MED ORDER — FENTANYL CITRATE (PF) 100 MCG/2ML IJ SOLN
INTRAMUSCULAR | Status: DC | PRN
  Administered 2016-10-04: 50 ug via INTRAVENOUS

## 2016-10-04 MED ORDER — BRIMONIDINE TARTRATE-TIMOLOL 0.2-0.5 % OP SOLN
OPHTHALMIC | Status: DC | PRN
  Administered 2016-10-04: 1 [drp] via OPHTHALMIC

## 2016-10-04 MED ORDER — ARMC OPHTHALMIC DILATING DROPS
1.0000 "application " | OPHTHALMIC | Status: DC | PRN
  Administered 2016-10-04 (×3): 1 via OPHTHALMIC

## 2016-10-04 MED ORDER — EPINEPHRINE PF 1 MG/ML IJ SOLN
INTRAOCULAR | Status: DC | PRN
  Administered 2016-10-04: 90 mL via OPHTHALMIC

## 2016-10-04 MED ORDER — NA HYALUR & NA CHOND-NA HYALUR 0.4-0.35 ML IO KIT
PACK | INTRAOCULAR | Status: DC | PRN
  Administered 2016-10-04: 1 mL via INTRAOCULAR

## 2016-10-04 MED ORDER — CEFUROXIME OPHTHALMIC INJECTION 1 MG/0.1 ML
INJECTION | OPHTHALMIC | Status: DC | PRN
  Administered 2016-10-04: .3 mL via OPHTHALMIC

## 2016-10-04 MED ORDER — MOXIFLOXACIN HCL 0.5 % OP SOLN
1.0000 [drp] | OPHTHALMIC | Status: DC | PRN
  Administered 2016-10-04 (×3): 1 [drp] via OPHTHALMIC

## 2016-10-04 SURGICAL SUPPLY — 25 items
CANNULA ANT/CHMB 27GA (MISCELLANEOUS) ×3 IMPLANT
CARTRIDGE ABBOTT (MISCELLANEOUS) IMPLANT
GLOVE SURG LX 7.5 STRW (GLOVE) ×2
GLOVE SURG LX STRL 7.5 STRW (GLOVE) ×1 IMPLANT
GLOVE SURG TRIUMPH 8.0 PF LTX (GLOVE) ×3 IMPLANT
GOWN STRL REUS W/ TWL LRG LVL3 (GOWN DISPOSABLE) ×2 IMPLANT
GOWN STRL REUS W/TWL LRG LVL3 (GOWN DISPOSABLE) ×4
LENS IOL TECNIS ITEC 21.0 (Intraocular Lens) ×3 IMPLANT
MARKER SKIN DUAL TIP RULER LAB (MISCELLANEOUS) ×3 IMPLANT
NDL RETROBULBAR .5 NSTRL (NEEDLE) IMPLANT
NEEDLE FILTER BLUNT 18X 1/2SAF (NEEDLE) ×2
NEEDLE FILTER BLUNT 18X1 1/2 (NEEDLE) ×1 IMPLANT
PACK CATARACT BRASINGTON (MISCELLANEOUS) ×3 IMPLANT
PACK EYE AFTER SURG (MISCELLANEOUS) ×3 IMPLANT
PACK OPTHALMIC (MISCELLANEOUS) ×3 IMPLANT
RING MALYGIN 7.0 (MISCELLANEOUS) IMPLANT
SUT ETHILON 10-0 CS-B-6CS-B-6 (SUTURE)
SUT VICRYL  9 0 (SUTURE)
SUT VICRYL 9 0 (SUTURE) IMPLANT
SUTURE EHLN 10-0 CS-B-6CS-B-6 (SUTURE) IMPLANT
SYR 3ML LL SCALE MARK (SYRINGE) ×3 IMPLANT
SYR 5ML LL (SYRINGE) ×3 IMPLANT
SYR TB 1ML LUER SLIP (SYRINGE) ×3 IMPLANT
WATER STERILE IRR 250ML POUR (IV SOLUTION) ×3 IMPLANT
WIPE NON LINTING 3.25X3.25 (MISCELLANEOUS) ×3 IMPLANT

## 2016-10-04 NOTE — Op Note (Signed)
LOCATION:  [facility]   PREOPERATIVE DIAGNOSIS:    Nuclear sclerotic cataract right eye. H25.11   POSTOPERATIVE DIAGNOSIS:  Nuclear sclerotic cataract right eye.     PROCEDURE:  Phacoemusification with posterior chamber intraocular lens placement of the right eye   LENS:   Implant Name Type Inv. Item Serial No. Manufacturer Lot No. LRB No. Used  LENS IOL DIOP 21.0 - [id number] Intraocular Lens LENS IOL DIOP 21.0 [id number] AMO   Right 1        ULTRASOUND TIME: 24 % of 2 minutes, 25 seconds.  CDE 34.7   SURGEON:  Wyonia Hough, MD   ANESTHESIA:  Topical with tetracaine drops and 2% Xylocaine jelly, augmented with 1% preservative-free intracameral lidocaine.    COMPLICATIONS:  None.   DESCRIPTION OF PROCEDURE:  The patient was identified in the holding room and transported to the operating room and placed in the supine position under the operating microscope.  The right eye was identified as the operative eye and it was prepped and draped in the usual sterile ophthalmic fashion.   A 1 millimeter clear-corneal paracentesis was made at the 12:00 position.  0.5 ml of preservative-free 1% lidocaine was injected into the anterior chamber. The anterior chamber was filled with Viscoat viscoelastic.  A 2.4 millimeter keratome was used to make a near-clear corneal incision at the 9:00 position.  A curvilinear capsulorrhexis was made with a cystotome and capsulorrhexis forceps.  Balanced salt solution was used to hydrodissect and hydrodelineate the nucleus.   Phacoemulsification was then used in stop and chop fashion to remove the lens nucleus and epinucleus.  The remaining cortex was then removed using the irrigation and aspiration handpiece. Provisc was then placed into the capsular bag to distend it for lens placement.  A lens was then injected into the capsular bag.  The remaining viscoelastic was aspirated.   Wounds were hydrated with balanced salt solution.  The anterior  chamber was inflated to a physiologic pressure with balanced salt solution.  No wound leaks were noted. Cefuroxime 0.1 ml of a 10mg /ml solution was injected into the anterior chamber for a dose of 1 mg of intracameral antibiotic at the completion of the case.   Timolol and Brimonidine drops were applied to the eye.  The patient was taken to the recovery room in stable condition without complications of anesthesia or surgery.   Avin Upperman 10/04/2016, 8:38 AM

## 2016-10-04 NOTE — Transfer of Care (Signed)
Immediate Anesthesia Transfer of Care Note  Patient: [name]  Procedure(s) Performed: Procedure(s): CATARACT EXTRACTION PHACO AND INTRAOCULAR LENS PLACEMENT (IOC)  right (Right)  Patient Location: PACU  Anesthesia Type: MAC  Level of Consciousness: awake, alert  and patient cooperative  Airway and Oxygen Therapy: Patient Spontanous Breathing and Patient connected to supplemental oxygen  Post-op Assessment: Post-op Vital signs reviewed, Patient's Cardiovascular Status Stable, Respiratory Function Stable, Patent Airway and No signs of Nausea or vomiting  Post-op Vital Signs: Reviewed and stable  Complications: No apparent anesthesia complications

## 2016-10-04 NOTE — Anesthesia Preprocedure Evaluation (Addendum)
Anesthesia Evaluation    Airway Mallampati: II  TM Distance: <3 FB     Dental  (+) Upper Dentures, Lower Dentures   Pulmonary COPD,    breath sounds clear to auscultation       Cardiovascular hypertension, + CAD and +CHF  + dysrhythmias (afib, s/p cardioversion)  Rhythm:regular Rate:Normal  S/p Bentall procedure 2016   Neuro/Psych Anxiety    GI/Hepatic   Endo/Other  Hypothyroidism   Renal/GU      Musculoskeletal  (+) Arthritis ,   Abdominal   Peds  Hematology   Anesthesia Other Findings   Reproductive/Obstetrics                           Anesthesia Physical Anesthesia Plan  ASA: III  Anesthesia Plan: MAC   Post-op Pain Management:    Induction:   PONV Risk Score and Plan:   Airway Management Planned:   Additional Equipment:   Intra-op Plan:   Post-operative Plan:   Informed Consent: I have reviewed the patients History and Physical, chart, labs and discussed the procedure including the risks, benefits and alternatives for the proposed anesthesia with the patient or authorized representative who has indicated his/her understanding and acceptance.     Plan Discussed with: CRNA  Anesthesia Plan Comments:         Anesthesia Quick Evaluation

## 2016-10-04 NOTE — Anesthesia Postprocedure Evaluation (Signed)
Anesthesia Post Note  Patient: [name]  Procedure(s) Performed: Procedure(s) (LRB): CATARACT EXTRACTION PHACO AND INTRAOCULAR LENS PLACEMENT (IOC)  right (Right)  Patient location during evaluation: PACU Anesthesia Type: MAC Level of consciousness: awake and alert Pain management: pain level controlled Vital Signs Assessment: post-procedure vital signs reviewed and stable Respiratory status: spontaneous breathing, nonlabored ventilation and respiratory function stable Cardiovascular status: stable Anesthetic complications: no    Veda Canning

## 2016-10-04 NOTE — H&P (Signed)
The History and Physical notes are on paper, have been signed, and are to be scanned. The patient remains stable and unchanged from the H&P.   Previous H&P reviewed, patient examined, and there are no changes.  [name] 10/04/2016 7:41 AM   

## 2016-10-04 NOTE — Anesthesia Procedure Notes (Signed)
Procedure Name: MAC Performed by: Younique Casad Pre-anesthesia Checklist: Patient identified, Emergency Drugs available, Suction available, Timeout performed and Patient being monitored Patient Re-evaluated:Patient Re-evaluated prior to inductionOxygen Delivery Method: Nasal cannula Placement Confirmation: positive ETCO2     

## 2016-10-04 NOTE — Addendum Note (Signed)
Addendum  created 10/04/16 0459 by Veda Canning, MD   Sign clinical note

## 2016-10-05 ENCOUNTER — Encounter: Payer: Self-pay | Admitting: Ophthalmology

## 2016-10-18 DIAGNOSIS — H2512 Age-related nuclear cataract, left eye: Secondary | ICD-10-CM | POA: Diagnosis not present

## 2016-10-23 ENCOUNTER — Encounter: Payer: Self-pay | Admitting: *Deleted

## 2016-10-25 NOTE — Discharge Instructions (Signed)

## 2016-10-27 MED ORDER — ACETAMINOPHEN 325 MG PO TABS: 325.0000 mg | ORAL_TABLET | ORAL | Status: DC | PRN

## 2016-10-27 MED ORDER — ACETAMINOPHEN 160 MG/5ML PO SOLN: 325.0000 mg | ORAL | Status: DC | PRN

## 2016-10-27 MED ORDER — ONDANSETRON HCL 4 MG/2ML IJ SOLN: 4.0000 mg | Freq: Once | INTRAMUSCULAR | Status: DC | PRN

## 2016-10-30 ENCOUNTER — Ambulatory Visit
Admission: RE | Admit: 2016-10-30 | Discharge: 2016-10-30 | Disposition: A | Discharge status: Home / Self Care | Payer: Medicare Other | Source: Ambulatory Visit | Attending: Ophthalmology | Admitting: Ophthalmology

## 2016-10-30 ENCOUNTER — Ambulatory Visit: Payer: Medicare Other | Admitting: Anesthesiology

## 2016-10-30 ENCOUNTER — Encounter
Admission: RE | Disposition: A | Discharge status: Home / Self Care | Payer: Self-pay | Source: Ambulatory Visit | Attending: Ophthalmology

## 2016-10-30 DIAGNOSIS — Z952 Presence of prosthetic heart valve: Secondary | ICD-10-CM | POA: Diagnosis not present

## 2016-10-30 DIAGNOSIS — H2512 Age-related nuclear cataract, left eye: Secondary | ICD-10-CM | POA: Diagnosis not present

## 2016-10-30 DIAGNOSIS — I4891 Unspecified atrial fibrillation: Secondary | ICD-10-CM | POA: Diagnosis not present

## 2016-10-30 DIAGNOSIS — Z96653 Presence of artificial knee joint, bilateral: Secondary | ICD-10-CM | POA: Insufficient documentation

## 2016-10-30 DIAGNOSIS — I110 Hypertensive heart disease with heart failure: Secondary | ICD-10-CM | POA: Insufficient documentation

## 2016-10-30 DIAGNOSIS — J449 Chronic obstructive pulmonary disease, unspecified: Secondary | ICD-10-CM | POA: Insufficient documentation

## 2016-10-30 DIAGNOSIS — M1990 Unspecified osteoarthritis, unspecified site: Secondary | ICD-10-CM | POA: Insufficient documentation

## 2016-10-30 DIAGNOSIS — I509 Heart failure, unspecified: Secondary | ICD-10-CM | POA: Insufficient documentation

## 2016-10-30 DIAGNOSIS — E039 Hypothyroidism, unspecified: Secondary | ICD-10-CM | POA: Insufficient documentation

## 2016-10-30 DIAGNOSIS — E7800 Pure hypercholesterolemia, unspecified: Secondary | ICD-10-CM | POA: Diagnosis not present

## 2016-10-30 DIAGNOSIS — I2510 Atherosclerotic heart disease of native coronary artery without angina pectoris: Secondary | ICD-10-CM | POA: Insufficient documentation

## 2016-10-30 DIAGNOSIS — G4730 Sleep apnea, unspecified: Secondary | ICD-10-CM | POA: Diagnosis not present

## 2016-10-30 HISTORY — PX: CATARACT EXTRACTION W/PHACO: SHX586

## 2016-10-30 SURGERY — PHACOEMULSIFICATION, CATARACT, WITH IOL INSERTION
Anesthesia: Monitor Anesthesia Care | Site: Eye | Laterality: Left | Wound class: Clean

## 2016-10-30 MED ORDER — LACTATED RINGERS IV SOLN: INTRAVENOUS | Status: DC

## 2016-10-30 MED ORDER — FENTANYL CITRATE (PF) 100 MCG/2ML IJ SOLN
INTRAMUSCULAR | Status: DC | PRN
  Administered 2016-10-30: 50 ug via INTRAVENOUS

## 2016-10-30 MED ORDER — LIDOCAINE HCL (PF) 2 % IJ SOLN
INTRAOCULAR | Status: DC | PRN
  Administered 2016-10-30: 1 mL via INTRAOCULAR

## 2016-10-30 MED ORDER — MOXIFLOXACIN HCL 0.5 % OP SOLN
1.0000 [drp] | OPHTHALMIC | Status: DC | PRN
Start: 2016-10-30 — End: 2016-10-30
  Administered 2016-10-30 (×3): 1 [drp] via OPHTHALMIC

## 2016-10-30 MED ORDER — ARMC OPHTHALMIC DILATING DROPS
1.0000 "application " | OPHTHALMIC | Status: DC | PRN
  Administered 2016-10-30 (×3): 1 via OPHTHALMIC

## 2016-10-30 MED ORDER — NA HYALUR & NA CHOND-NA HYALUR 0.4-0.35 ML IO KIT
PACK | INTRAOCULAR | Status: DC | PRN
  Administered 2016-10-30: 1 mL via INTRAOCULAR

## 2016-10-30 MED ORDER — CEFUROXIME OPHTHALMIC INJECTION 1 MG/0.1 ML
INJECTION | OPHTHALMIC | Status: DC | PRN
  Administered 2016-10-30: 0.1 mL via OPHTHALMIC

## 2016-10-30 MED ORDER — EPINEPHRINE PF 1 MG/ML IJ SOLN
INTRAOCULAR | Status: DC | PRN
  Administered 2016-10-30: 75 mL via OPHTHALMIC

## 2016-10-30 MED ORDER — MIDAZOLAM HCL 2 MG/2ML IJ SOLN
INTRAMUSCULAR | Status: DC | PRN
  Administered 2016-10-30: 2 mg via INTRAVENOUS

## 2016-10-30 MED ORDER — BRIMONIDINE TARTRATE-TIMOLOL 0.2-0.5 % OP SOLN
OPHTHALMIC | Status: DC | PRN
  Administered 2016-10-30: 1 [drp] via OPHTHALMIC

## 2016-10-30 SURGICAL SUPPLY — 25 items
CANNULA ANT/CHMB 27GA (MISCELLANEOUS) ×3 IMPLANT
CARTRIDGE ABBOTT (MISCELLANEOUS) IMPLANT
GLOVE SURG LX 7.5 STRW (GLOVE) ×4
GLOVE SURG LX STRL 7.5 STRW (GLOVE) ×2 IMPLANT
GLOVE SURG TRIUMPH 8.0 PF LTX (GLOVE) ×3 IMPLANT
GOWN STRL REUS W/ TWL LRG LVL3 (GOWN DISPOSABLE) ×2 IMPLANT
GOWN STRL REUS W/TWL LRG LVL3 (GOWN DISPOSABLE) ×4
LENS IOL TECNIS ITEC 21.0 (Intraocular Lens) ×3 IMPLANT
MARKER SKIN DUAL TIP RULER LAB (MISCELLANEOUS) ×3 IMPLANT
NDL RETROBULBAR .5 NSTRL (NEEDLE) IMPLANT
NEEDLE FILTER BLUNT 18X 1/2SAF (NEEDLE) ×2
NEEDLE FILTER BLUNT 18X1 1/2 (NEEDLE) ×1 IMPLANT
PACK CATARACT BRASINGTON (MISCELLANEOUS) ×3 IMPLANT
PACK EYE AFTER SURG (MISCELLANEOUS) ×3 IMPLANT
PACK OPTHALMIC (MISCELLANEOUS) ×3 IMPLANT
RING MALYGIN 7.0 (MISCELLANEOUS) IMPLANT
SUT ETHILON 10-0 CS-B-6CS-B-6 (SUTURE)
SUT VICRYL  9 0 (SUTURE)
SUT VICRYL 9 0 (SUTURE) IMPLANT
SUTURE EHLN 10-0 CS-B-6CS-B-6 (SUTURE) IMPLANT
SYR 3ML LL SCALE MARK (SYRINGE) ×3 IMPLANT
SYR 5ML LL (SYRINGE) ×3 IMPLANT
SYR TB 1ML LUER SLIP (SYRINGE) ×3 IMPLANT
WATER STERILE IRR 250ML POUR (IV SOLUTION) ×3 IMPLANT
WIPE NON LINTING 3.25X3.25 (MISCELLANEOUS) ×3 IMPLANT

## 2016-10-30 NOTE — Anesthesia Procedure Notes (Signed)
Procedure Name: MAC Performed by: Mayme Genta Pre-anesthesia Checklist: Patient identified, Emergency Drugs available, Suction available, Timeout performed and Patient being monitored Patient Re-evaluated:Patient Re-evaluated prior to inductionOxygen Delivery Method: Nasal cannula Placement Confirmation: positive ETCO2

## 2016-10-30 NOTE — Transfer of Care (Signed)
Immediate Anesthesia Transfer of Care Note  Patient: [name]  Procedure(s) Performed: Procedure(s) with comments: CATARACT EXTRACTION PHACO AND INTRAOCULAR LENS PLACEMENT (IOC)  Left (Left) - Requests early  Patient Location: PACU  Anesthesia Type: MAC  Level of Consciousness: awake, alert  and patient cooperative  Airway and Oxygen Therapy: Patient Spontanous Breathing and Patient connected to supplemental oxygen  Post-op Assessment: Post-op Vital signs reviewed, Patient's Cardiovascular Status Stable, Respiratory Function Stable, Patent Airway and No signs of Nausea or vomiting  Post-op Vital Signs: Reviewed and stable  Complications: No apparent anesthesia complications

## 2016-10-30 NOTE — Anesthesia Preprocedure Evaluation (Signed)
Anesthesia Evaluation    Airway Mallampati: II  TM Distance: <3 FB     Dental  (+) Upper Dentures, Lower Dentures   Pulmonary COPD,    breath sounds clear to auscultation       Cardiovascular hypertension, + CAD and +CHF  + dysrhythmias (afib, s/p cardioversion)  Rhythm:regular Rate:Normal  S/p Bentall procedure 2016   Neuro/Psych    GI/Hepatic   Endo/Other  Hypothyroidism   Renal/GU      Musculoskeletal  (+) Arthritis ,   Abdominal   Peds  Hematology   Anesthesia Other Findings   Reproductive/Obstetrics                             Anesthesia Physical  Anesthesia Plan  ASA: III  Anesthesia Plan: MAC   Post-op Pain Management:    Induction:   PONV Risk Score and Plan:   Airway Management Planned:   Additional Equipment:   Intra-op Plan:   Post-operative Plan:   Informed Consent: I have reviewed the patients History and Physical, chart, labs and discussed the procedure including the risks, benefits and alternatives for the proposed anesthesia with the patient or authorized representative who has indicated his/her understanding and acceptance.     Plan Discussed with: CRNA  Anesthesia Plan Comments:         Anesthesia Quick Evaluation

## 2016-10-30 NOTE — Op Note (Signed)
OPERATIVE NOTE  CHANNING YEAGER [id number] 10/30/2016   PREOPERATIVE DIAGNOSIS:  Nuclear sclerotic cataract left eye. H25.12   POSTOPERATIVE DIAGNOSIS:    Nuclear sclerotic cataract left eye.     PROCEDURE:  Phacoemusification with posterior chamber intraocular lens placement of the left eye   LENS:   Implant Name Type Inv. Item Serial No. Manufacturer Lot No. LRB No. Used  LENS IOL DIOP 21.0 - [id number] Intraocular Lens LENS IOL DIOP 21.0 [id number]   Left 1        ULTRASOUND TIME: 22  % of 2 minutes 6 seconds, CDE 27.7  SURGEON:  Wyonia Hough, MD   ANESTHESIA:  Topical with tetracaine drops and 2% Xylocaine jelly, augmented with 1% preservative-free intracameral lidocaine.    COMPLICATIONS:  None.   DESCRIPTION OF PROCEDURE:  The patient was identified in the holding room and transported to the operating room and placed in the supine position under the operating microscope.  The left eye was identified as the operative eye and it was prepped and draped in the usual sterile ophthalmic fashion.   A 1 millimeter clear-corneal paracentesis was made at the 1:30 position.  0.5 ml of preservative-free 1% lidocaine was injected into the anterior chamber.  The anterior chamber was filled with Viscoat viscoelastic.  A 2.4 millimeter keratome was used to make a near-clear corneal incision at the 10:30 position.  .  A curvilinear capsulorrhexis was made with a cystotome and capsulorrhexis forceps.  Balanced salt solution was used to hydrodissect and hydrodelineate the nucleus.   Phacoemulsification was then used in stop and chop fashion to remove the lens nucleus and epinucleus.  The remaining cortex was then removed using the irrigation and aspiration handpiece. Provisc was then placed into the capsular bag to distend it for lens placement.  A lens was then injected into the capsular bag.  The remaining viscoelastic was aspirated.   Wounds were hydrated with balanced salt  solution.  The anterior chamber was inflated to a physiologic pressure with balanced salt solution.  No wound leaks were noted. Cefuroxime 0.1 ml of a 10mg /ml solution was injected into the anterior chamber for a dose of 1 mg of intracameral antibiotic at the completion of the case.   Timolol and Brimonidine drops were applied to the eye.  The patient was taken to the recovery room in stable condition without complications of anesthesia or surgery.  Torris House 10/30/2016, 10:05 AM

## 2016-10-30 NOTE — Anesthesia Postprocedure Evaluation (Signed)
Anesthesia Post Note  Patient: [name]  Procedure(s) Performed: Procedure(s) (LRB): CATARACT EXTRACTION PHACO AND INTRAOCULAR LENS PLACEMENT (IOC)  Left (Left)  Patient location during evaluation: PACU Anesthesia Type: MAC Level of consciousness: awake and alert and oriented Pain management: satisfactory to patient Vital Signs Assessment: post-procedure vital signs reviewed and stable Respiratory status: spontaneous breathing, nonlabored ventilation and respiratory function stable Cardiovascular status: blood pressure returned to baseline and stable Postop Assessment: Adequate PO intake and No signs of nausea or vomiting Anesthetic complications: no    Raliegh Ip

## 2016-10-30 NOTE — H&P (Signed)
The History and Physical notes are on paper, have been signed, and are to be scanned. The patient remains stable and unchanged from the H&P.   Previous H&P reviewed, patient examined, and there are no changes.  [name] 10/30/2016 8:46 AM

## 2016-10-31 ENCOUNTER — Encounter: Payer: Self-pay | Admitting: Ophthalmology

## 2016-11-10 DIAGNOSIS — S70361A Insect bite (nonvenomous), right thigh, initial encounter: Secondary | ICD-10-CM | POA: Diagnosis not present

## 2016-11-10 DIAGNOSIS — J449 Chronic obstructive pulmonary disease, unspecified: Secondary | ICD-10-CM | POA: Diagnosis not present

## 2016-11-10 DIAGNOSIS — I119 Hypertensive heart disease without heart failure: Secondary | ICD-10-CM | POA: Diagnosis not present

## 2016-11-10 DIAGNOSIS — E785 Hyperlipidemia, unspecified: Secondary | ICD-10-CM | POA: Diagnosis not present

## 2016-11-10 DIAGNOSIS — I480 Paroxysmal atrial fibrillation: Secondary | ICD-10-CM | POA: Diagnosis not present

## 2017-01-16 IMAGING — CR DG CHEST 2V
2 series · 2 of 2 positions shown · non-contrast
Comparison: Portable chest x-ray dated July 10, 2014, and PA and
lateral chest x-ray July 06, 2014. There is mild multilevel
degenerative disc disease of the thoracic spine.

CLINICAL DATA: Aortic insufficiency, history of CHF, atrial
fibrillation, and asthma.

EXAM:
CHEST  2 VIEW

[w chest pa]
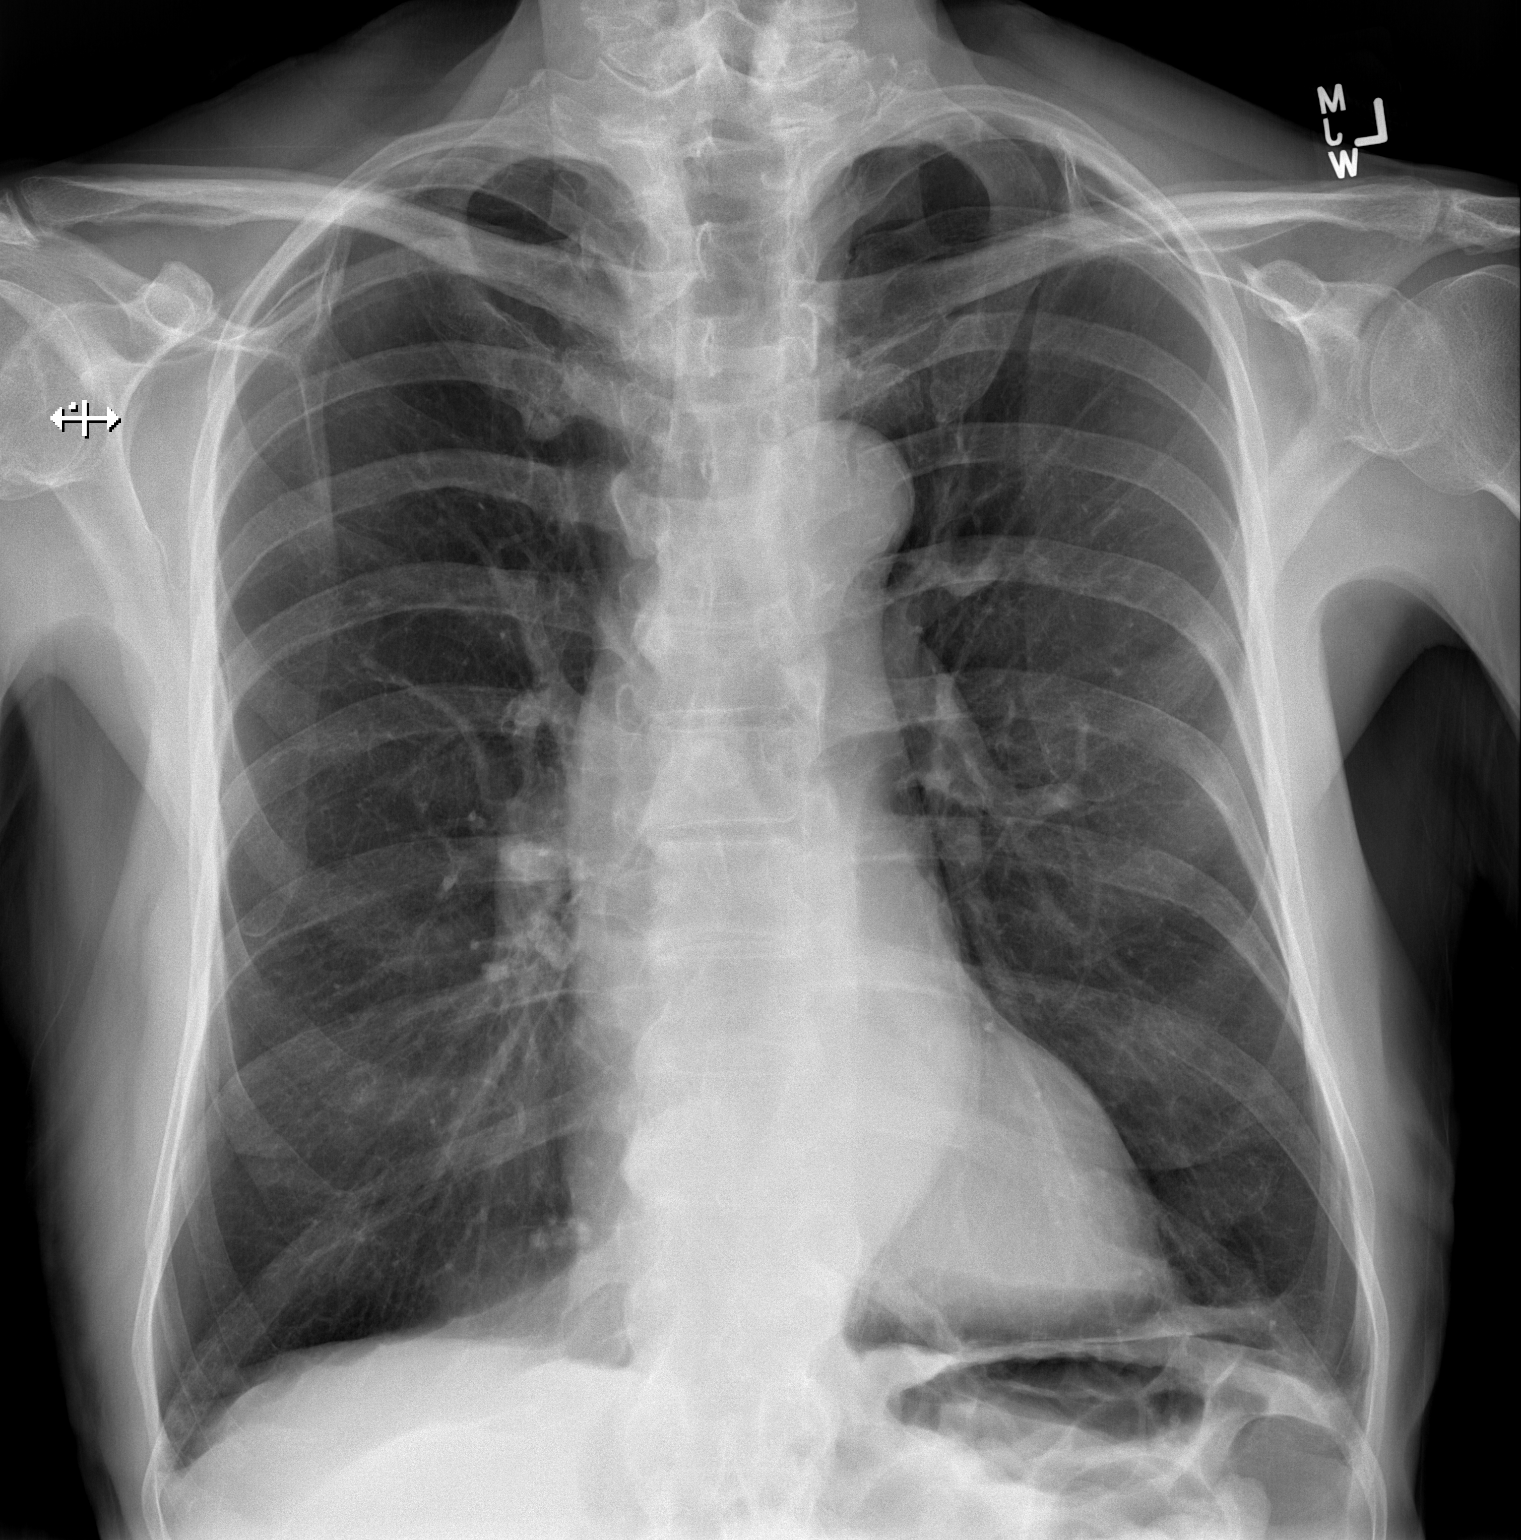

[w chest lat]
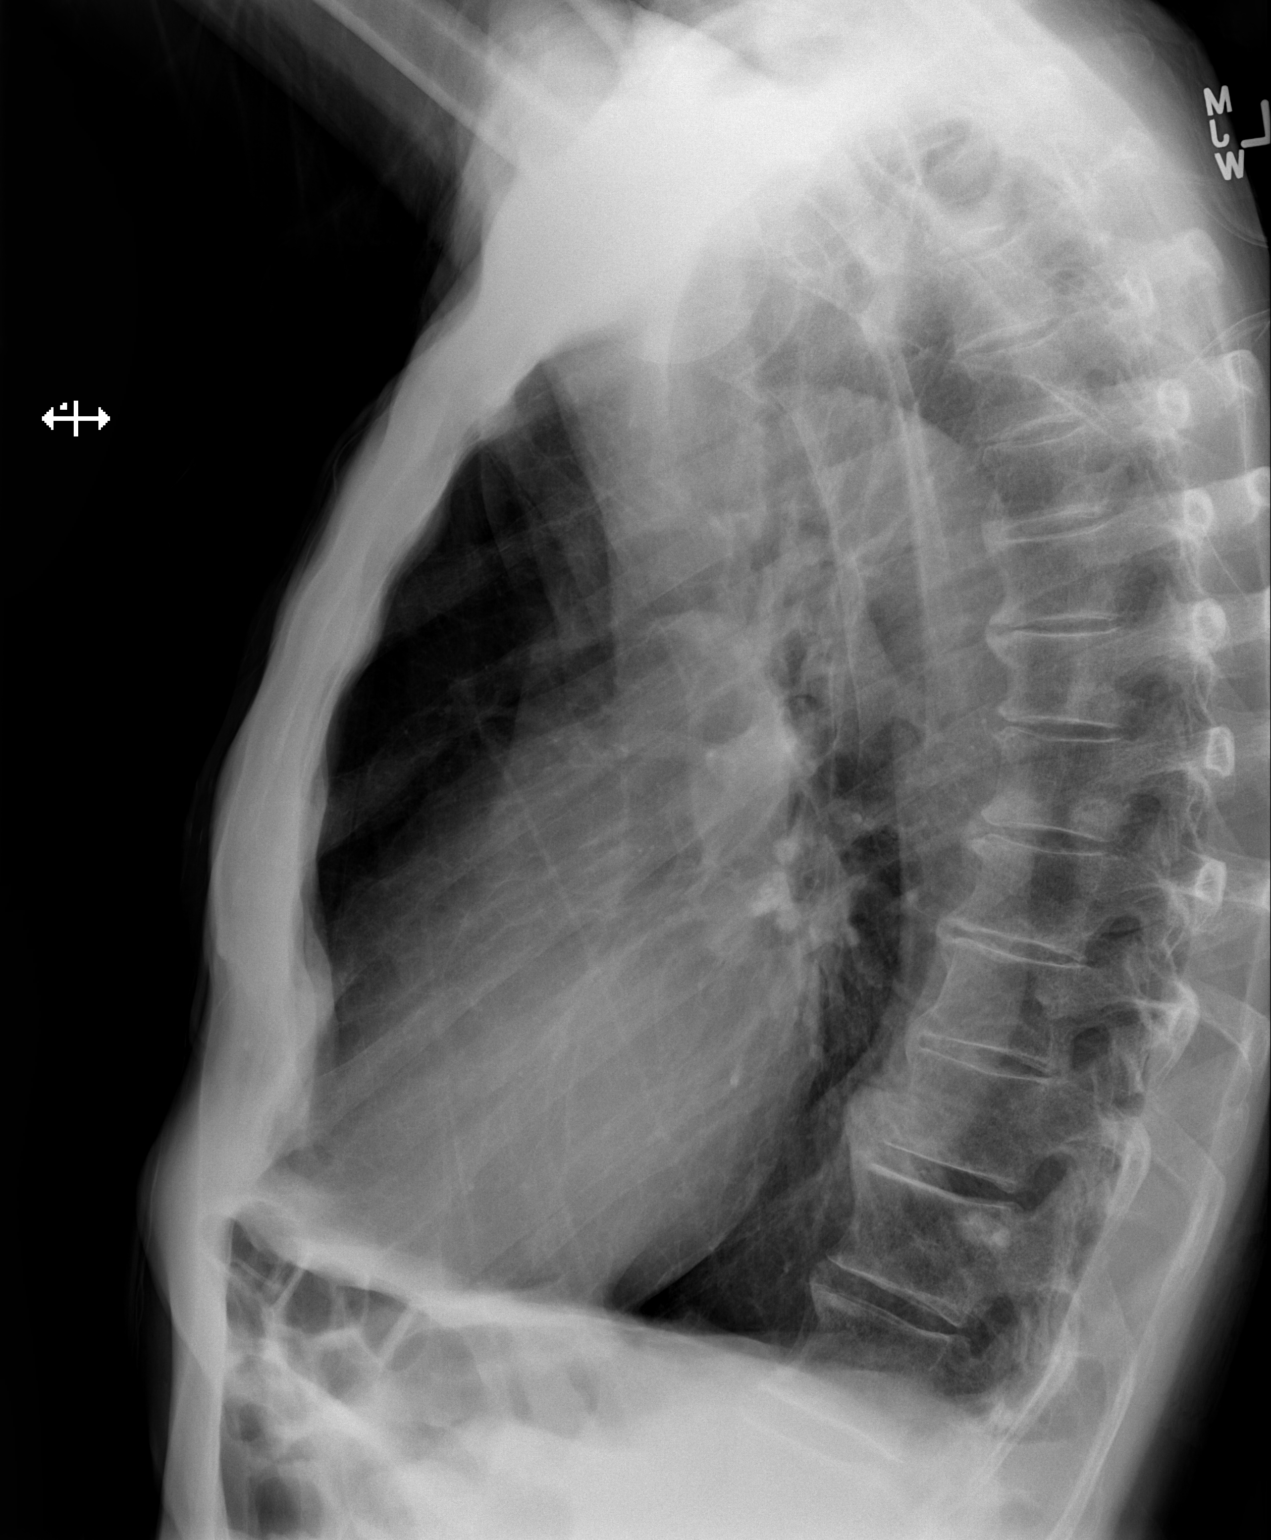

[2 of 2 positions shown; findings below may reference images not displayed]

FINDINGS: The lungs remain hyperinflated with hemidiaphragm flattening and
increased AP dimension of the thorax. There is no alveolar
infiltrate. There is a calcified nodule in the right lower lobe
posteriorly. The heart and pulmonary vascularity are normal. The
mediastinum is normal in width. There is no pleural effusion. There
is tortuosity of the descending thoracic aorta.
IMPRESSION: Hyperinflation consistent with reactive airway disease, stable.
Stable previous granulomatous infection. There is no CHF, pneumonia,
nor other acute cardiopulmonary abnormality.

## 2017-01-18 IMAGING — CR DG CHEST 1V PORT
1 series · 1 of 1 positions shown · non-contrast
Comparison: 03/15/2015 radiographs.

CLINICAL DATA: Postop aortic valve replacement.  Initial encounter.

EXAM:
PORTABLE CHEST 1 VIEW

[AP]
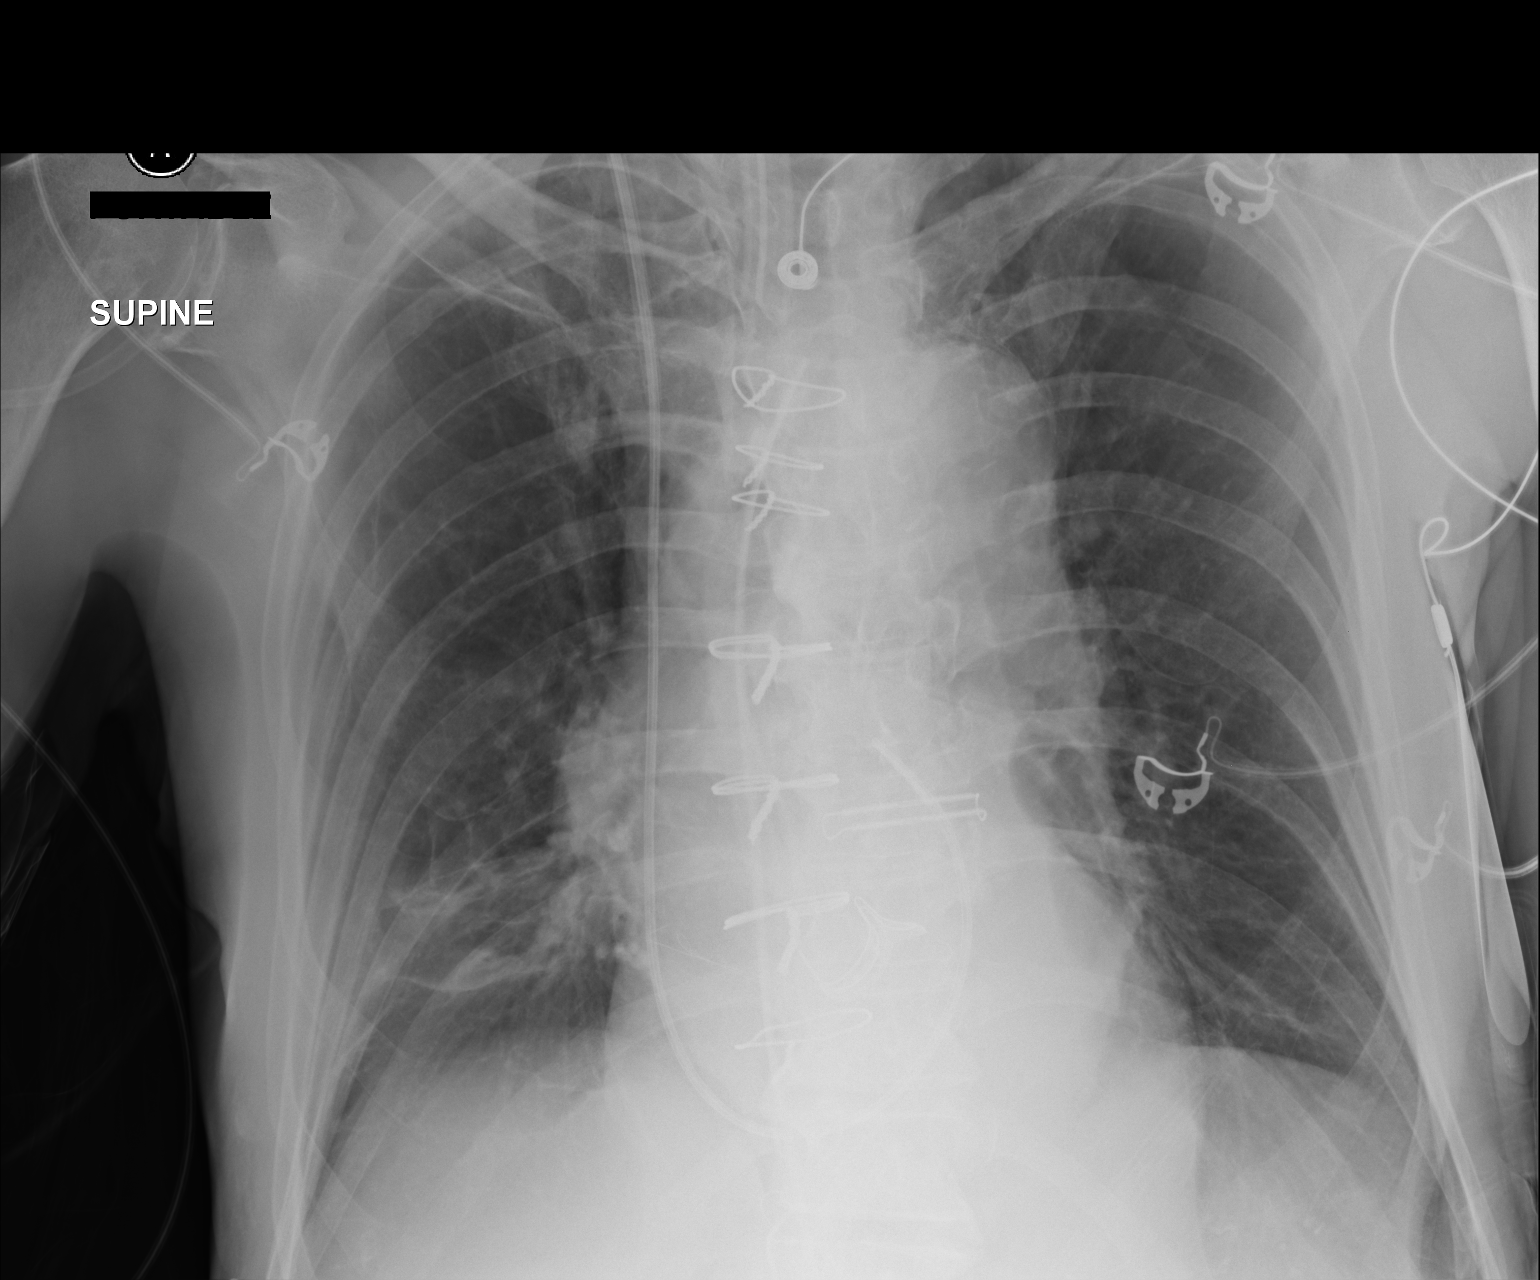

[1 of 1 positions shown; findings below may reference images not displayed]

FINDINGS: 7865 hours. Endotracheal tube tip is in the midtrachea. Right IJ
Swan-Ganz catheter tip is in the main pulmonary artery. There is a
probable mediastinal drain or medial right-sided chest tube. The
heart size is stable status post median sternotomy and aortic valve
replacement. There is mild atelectasis at both lung bases. No
pneumothorax or significant pleural effusion identified.
IMPRESSION: No demonstrated complication following aortic valve replacement.
Mild bibasilar atelectasis.

## 2017-02-05 DIAGNOSIS — Z23 Encounter for immunization: Secondary | ICD-10-CM | POA: Diagnosis not present

## 2017-02-07 DIAGNOSIS — I5020 Unspecified systolic (congestive) heart failure: Secondary | ICD-10-CM | POA: Diagnosis not present

## 2017-02-07 DIAGNOSIS — Z952 Presence of prosthetic heart valve: Secondary | ICD-10-CM | POA: Diagnosis not present

## 2017-02-07 DIAGNOSIS — J449 Chronic obstructive pulmonary disease, unspecified: Secondary | ICD-10-CM | POA: Diagnosis not present

## 2017-02-07 DIAGNOSIS — I480 Paroxysmal atrial fibrillation: Secondary | ICD-10-CM | POA: Diagnosis not present

## 2017-02-07 DIAGNOSIS — I119 Hypertensive heart disease without heart failure: Secondary | ICD-10-CM | POA: Diagnosis not present

## 2017-02-14 DIAGNOSIS — I480 Paroxysmal atrial fibrillation: Secondary | ICD-10-CM | POA: Diagnosis not present

## 2017-02-14 DIAGNOSIS — E785 Hyperlipidemia, unspecified: Secondary | ICD-10-CM | POA: Diagnosis not present

## 2017-02-14 DIAGNOSIS — J449 Chronic obstructive pulmonary disease, unspecified: Secondary | ICD-10-CM | POA: Diagnosis not present

## 2017-02-14 DIAGNOSIS — Z952 Presence of prosthetic heart valve: Secondary | ICD-10-CM | POA: Diagnosis not present

## 2017-02-14 DIAGNOSIS — I119 Hypertensive heart disease without heart failure: Secondary | ICD-10-CM | POA: Diagnosis not present

## 2017-02-14 DIAGNOSIS — I5020 Unspecified systolic (congestive) heart failure: Secondary | ICD-10-CM | POA: Diagnosis not present

## 2017-04-25 DIAGNOSIS — I509 Heart failure, unspecified: Secondary | ICD-10-CM | POA: Diagnosis not present

## 2017-04-25 DIAGNOSIS — Z952 Presence of prosthetic heart valve: Secondary | ICD-10-CM | POA: Diagnosis not present

## 2017-04-25 DIAGNOSIS — I119 Hypertensive heart disease without heart failure: Secondary | ICD-10-CM | POA: Diagnosis not present

## 2017-04-25 DIAGNOSIS — J449 Chronic obstructive pulmonary disease, unspecified: Secondary | ICD-10-CM | POA: Diagnosis not present

## 2017-04-25 DIAGNOSIS — E785 Hyperlipidemia, unspecified: Secondary | ICD-10-CM | POA: Diagnosis not present

## 2017-04-25 DIAGNOSIS — I480 Paroxysmal atrial fibrillation: Secondary | ICD-10-CM | POA: Diagnosis not present

## 2017-08-14 ENCOUNTER — Other Ambulatory Visit
Admission: RE | Admit: 2017-08-14 | Discharge: 2017-08-14 | Disposition: A | Discharge status: Home / Self Care | Payer: Medicare Other | Source: Ambulatory Visit | Attending: Cardiology | Admitting: Cardiology

## 2017-08-14 DIAGNOSIS — E785 Hyperlipidemia, unspecified: Secondary | ICD-10-CM | POA: Insufficient documentation

## 2017-08-14 DIAGNOSIS — I10 Essential (primary) hypertension: Secondary | ICD-10-CM | POA: Insufficient documentation

## 2017-08-14 DIAGNOSIS — R7303 Prediabetes: Secondary | ICD-10-CM | POA: Diagnosis present

## 2017-08-14 DIAGNOSIS — I480 Paroxysmal atrial fibrillation: Secondary | ICD-10-CM | POA: Insufficient documentation

## 2017-08-14 LAB — HEPATIC FUNCTION PANEL
ALBUMIN: 4.5 g/dL (ref 3.5–5.0)
ALT: 18 U/L (ref 17–63)
AST: 24 U/L (ref 15–41)
Alkaline Phosphatase: 66 U/L (ref 38–126)
Bilirubin, Direct: 0.2 mg/dL (ref 0.1–0.5)
Indirect Bilirubin: 0.7 mg/dL (ref 0.3–0.9)
TOTAL PROTEIN: 7.4 g/dL (ref 6.5–8.1)
Total Bilirubin: 0.9 mg/dL (ref 0.3–1.2)

## 2017-08-14 LAB — BASIC METABOLIC PANEL
ANION GAP: 6 (ref 5–15)
BUN: 16 mg/dL (ref 6–20)
CHLORIDE: 96 mmol/L — AB (ref 101–111)
CO2: 28 mmol/L (ref 22–32)
Calcium: 9.1 mg/dL (ref 8.9–10.3)
Creatinine, Ser: 0.94 mg/dL (ref 0.61–1.24)
GFR calc non Af Amer: >60 mL/min (ref >60)
Glucose, Bld: 118 mg/dL — ABNORMAL HIGH (ref 65–99)
POTASSIUM: 4.4 mmol/L (ref 3.5–5.1)
SODIUM: 130 mmol/L — AB (ref 135–145)

## 2017-08-14 LAB — LIPID PANEL
CHOL/HDL RATIO: 2.3 ratio
CHOLESTEROL: 119 mg/dL (ref 0–200)
HDL: 51 mg/dL (ref >40)
LDL Cholesterol: 60 mg/dL (ref 0–99)
Triglycerides: 40 mg/dL (ref <150)
VLDL: 8 mg/dL (ref 0–40)

## 2017-08-14 LAB — HEMOGLOBIN A1C
HEMOGLOBIN A1C: 5.3 % (ref 4.8–5.6)
MEAN PLASMA GLUCOSE: 105.41 mg/dL

## 2017-08-14 LAB — TSH: TSH: 3.832 u[IU]/mL (ref 0.350–4.500)

## 2019-02-03 ENCOUNTER — Emergency Department: Payer: Medicare Other

## 2019-02-03 ENCOUNTER — Other Ambulatory Visit: Payer: Self-pay

## 2019-02-03 ENCOUNTER — Emergency Department
Admission: EM | Admit: 2019-02-03 | Discharge: 2019-02-03 | Disposition: A | Discharge status: Home / Self Care | Payer: Medicare Other | Attending: Emergency Medicine | Admitting: Emergency Medicine

## 2019-02-03 ENCOUNTER — Encounter: Payer: Self-pay | Admitting: *Deleted

## 2019-02-03 DIAGNOSIS — E039 Hypothyroidism, unspecified: Secondary | ICD-10-CM | POA: Diagnosis not present

## 2019-02-03 DIAGNOSIS — Z7982 Long term (current) use of aspirin: Secondary | ICD-10-CM | POA: Insufficient documentation

## 2019-02-03 DIAGNOSIS — Z7722 Contact with and (suspected) exposure to environmental tobacco smoke (acute) (chronic): Secondary | ICD-10-CM | POA: Insufficient documentation

## 2019-02-03 DIAGNOSIS — Z96653 Presence of artificial knee joint, bilateral: Secondary | ICD-10-CM | POA: Insufficient documentation

## 2019-02-03 DIAGNOSIS — R0602 Shortness of breath: Secondary | ICD-10-CM | POA: Diagnosis present

## 2019-02-03 DIAGNOSIS — I110 Hypertensive heart disease with heart failure: Secondary | ICD-10-CM | POA: Insufficient documentation

## 2019-02-03 DIAGNOSIS — J40 Bronchitis, not specified as acute or chronic: Secondary | ICD-10-CM | POA: Diagnosis not present

## 2019-02-03 DIAGNOSIS — Z79899 Other long term (current) drug therapy: Secondary | ICD-10-CM | POA: Insufficient documentation

## 2019-02-03 DIAGNOSIS — I5022 Chronic systolic (congestive) heart failure: Secondary | ICD-10-CM | POA: Insufficient documentation

## 2019-02-03 DIAGNOSIS — I2510 Atherosclerotic heart disease of native coronary artery without angina pectoris: Secondary | ICD-10-CM | POA: Diagnosis not present

## 2019-02-03 DIAGNOSIS — J449 Chronic obstructive pulmonary disease, unspecified: Secondary | ICD-10-CM | POA: Insufficient documentation

## 2019-02-03 LAB — BASIC METABOLIC PANEL
Anion gap: 10 (ref 5–15)
BUN: 26 mg/dL — ABNORMAL HIGH (ref 8–23)
CO2: 24 mmol/L (ref 22–32)
Calcium: 9.5 mg/dL (ref 8.9–10.3)
Chloride: 99 mmol/L (ref 98–111)
Creatinine, Ser: 1.09 mg/dL (ref 0.61–1.24)
GFR calc Af Amer: >60 mL/min (ref >60)
GFR calc non Af Amer: >60 mL/min (ref >60)
Glucose, Bld: 110 mg/dL — ABNORMAL HIGH (ref 70–99)
Potassium: 4.5 mmol/L (ref 3.5–5.1)
Sodium: 133 mmol/L — ABNORMAL LOW (ref 135–145)

## 2019-02-03 LAB — CBC
HCT: 35.4 % — ABNORMAL LOW (ref 39.0–52.0)
Hemoglobin: 11.7 g/dL — ABNORMAL LOW (ref 13.0–17.0)
MCH: 30.7 pg (ref 26.0–34.0)
MCHC: 33.1 g/dL (ref 30.0–36.0)
MCV: 92.9 fL (ref 80.0–100.0)
Platelets: 200 10*3/uL (ref 150–400)
RBC: 3.81 MIL/uL — ABNORMAL LOW (ref 4.22–5.81)
RDW: 12 % (ref 11.5–15.5)
WBC: 5.2 10*3/uL (ref 4.0–10.5)
nRBC: 0 % (ref 0.0–0.2)

## 2019-02-03 MED ORDER — AZITHROMYCIN 500 MG PO TABS
500.0000 mg | ORAL_TABLET | Freq: Once | ORAL | Status: AC
  Administered 2019-02-03: 500 mg via ORAL
  Filled 2019-02-03: qty 1

## 2019-02-03 MED ORDER — AZITHROMYCIN 250 MG PO TABS: 250.0000 mg | ORAL_TABLET | Freq: Every day | ORAL | 0 refills | Status: DC

## 2019-02-03 NOTE — ED Triage Notes (Addendum)
SOB x 1 week with a horse voice. Pt reporting he has had a cough but more to clear his throat. PT was checked by neighbor today who told him his oxygen saturation was 75% on RA. Pt is 100% upon arrival to ED. PT also reporting he is waking throughout the night to urinate frequently but denies hx of diabetes.   Pt also reports feeling increased weakness over the past week.

## 2019-02-03 NOTE — ED Provider Notes (Signed)
[facility] Emergency Department Provider Note  Time seen: 7:42 PM  I have reviewed the triage vital signs and the nursing notes.   HISTORY  Chief Complaint Shortness of Breath  HPI [name] is a 78 y.o. male with a past medical history of anxiety, CHF, COPD, hypertension, presents to the emergency department for shortness of breath.  According to the patient for the past 1 week he has had a hoarse voice and feels like he needs to clear his throat with occasional cough although denies any significant cough at this time.  Denies any chest pain.  Overall the patient appears well, no acute distress.  Patient is very talkative, speaking full sentences without any issues.  No reported fever.  Past Medical History:  Diagnosis Date  . A-fib ([place])   . Anxiety    Xanax PRN  . Arthritis   . Asthma   . Cataracts, bilateral   . CHF (congestive heart failure) ([place])   . History of bronchitis   . History of stomach ulcers   . Hypertension   . Hypothyroidism   . Seasonal allergies   . Wears dentures    full upper, lower sub-periostal    Patient Active Problem List   Diagnosis Date Noted  . Allergic rhinitis 08/26/2015  . AI (aortic insufficiency) 03/17/2015  . Paroxysmal atrial fibrillation ([place]) 12/18/2014  . COPD (chronic obstructive pulmonary disease) ([place]) 11/26/2014  . Coronary artery disease involving native coronary artery of native heart without angina pectoris 11/05/2014  . Chronic systolic CHF (congestive heart failure) ([place]) 09/09/2014  . Persistent atrial fibrillation ([place]) 09/09/2014  . 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 Emphysema of lung ([place]) 09/09/2014  . CHF exacerbation ([place]) 07/10/2014  . Acute systolic heart failure ([place]) 07/10/2014    Past Surgical History:  Procedure Laterality Date  . BENTALL PROCEDURE N/A 03/17/2015   Procedure: BIOLOGIC BENTALL PROCEDURE WITH 27 TISSUE VALVE AND 30 VALSALVA GRAFT;  Surgeon: Grace Isaac, MD;  Location: [place];  Service: Open Heart Surgery;  Laterality: N/A;  . CARDIAC CATHETERIZATION    . CARDIOVERSION N/A 07/13/2014   Procedure: CARDIOVERSION;  Surgeon: Dixie Dials, MD;  Location: [place] [facility];  Service: Cardiovascular;  Laterality: N/A;  . CARDIOVERSION N/A 10/01/2014   Procedure: CARDIOVERSION;  Surgeon: Larey Dresser, MD;  Location: [place];  Service: Cardiovascular;  Laterality: N/A;  . CATARACT EXTRACTION W/PHACO Right 10/04/2016   Procedure: CATARACT EXTRACTION PHACO AND INTRAOCULAR LENS PLACEMENT ([place])  right;  Surgeon: Leandrew Koyanagi, MD;  Location: [place];  Service: Ophthalmology;  Laterality: Right;  . CATARACT EXTRACTION W/PHACO Left 10/30/2016   Procedure: CATARACT EXTRACTION PHACO AND INTRAOCULAR LENS PLACEMENT ([place])  Left;  Surgeon: Leandrew Koyanagi, MD;  Location: [place];  Service: Ophthalmology;  Laterality: Left;  Requests early  . CHOLECYSTECTOMY    . CLIPPING OF ATRIAL APPENDAGE N/A 03/17/2015   Procedure: CLIPPING OF LEFT ATRIAL APPENDAGE;  Surgeon: Grace Isaac, MD;  Location: [place];  Service: Open Heart Surgery;  Laterality: N/A;  . COLONOSCOPY    . ESOPHAGOGASTRODUODENOSCOPY    . HERNIA REPAIR    . JOINT REPLACEMENT     bilateral knee, 1/09, 6/09  . LEFT AND RIGHT HEART CATHETERIZATION WITH CORONARY ANGIOGRAM N/A 07/14/2014   Procedure: LEFT AND RIGHT HEART CATHETERIZATION WITH CORONARY ANGIOGRAM;  Surgeon: Charolette Forward, MD;  Location: [facility];  Service: Cardiovascular;  Laterality: N/A;  . MAZE N/A 03/17/2015   Procedure: LEFT  BIPOLAR MAZE;  Surgeon: Grace Isaac, MD;  Location: [place];  Service: Open Heart Surgery;  Laterality: N/A;  . TEE WITHOUT CARDIOVERSION N/A 07/13/2014   Procedure: TRANSESOPHAGEAL ECHOCARDIOGRAM (TEE) WITH CARDIOVERSION;  Surgeon: Dixie Dials, MD;  Location: [place];  Service: Cardiovascular;  Laterality: N/A;  . TEE WITHOUT CARDIOVERSION N/A 03/17/2015    Procedure: TRANSESOPHAGEAL ECHOCARDIOGRAM (TEE);  Surgeon: Grace Isaac, MD;  Location: [place];  Service: Open Heart Surgery;  Laterality: N/A;    Prior to Admission medications   Medication Sig Start Date End Date Taking? Authorizing Provider  ADVAIR DISKUS 250-50 MCG/DOSE AEPB Inhale 1 puff into the lungs 2 (two) times daily.  06/16/14   [provider]  ALPRAZolam Duanne Moron) 0.25 MG tablet Take 0.25 mg by mouth 2 (two) times daily as needed for anxiety or sleep.  08/10/14   [provider]  amiodarone (PACERONE) 200 MG tablet Take 1 tablet (200 mg total) by mouth daily. Patient taking differently: Take 100 mg by mouth daily.  07/15/14   Charolette Forward, MD  aspirin 81 MG chewable tablet Chew 1 tablet (81 mg total) by mouth daily. 03/26/15   Nani Skillern, PA-C  atorvastatin (LIPITOR) 20 MG tablet Take 20 mg by mouth daily at 6 PM.  06/16/14   [provider]  bisacodyl (DULCOLAX) 5 MG EC tablet Take 5 mg by mouth daily as needed for moderate constipation.    [provider]  carvedilol (COREG) 6.25 MG tablet Take 1 tablet (6.25 mg total) by mouth 2 (two) times daily with a meal. 01/28/15   Larey Dresser, MD  levothyroxine (SYNTHROID, LEVOTHROID) 25 MCG tablet Take 25 mcg by mouth daily before breakfast.    [provider]  lisinopril (PRINIVIL,ZESTRIL) 5 MG tablet Take 0.5 tablets (2.5 mg total) by mouth 2 (two) times daily. 09/20/15   Larey Dresser, MD  PROAIR HFA 108 [phone] BASE) MCG/ACT inhaler Inhale 1 puff into the lungs every 4 (four) hours as needed for wheezing or shortness of breath.  07/07/14   [provider]  spironolactone (ALDACTONE) 25 MG tablet Take 0.5 tablets (12.5 mg total) by mouth daily. Patient taking differently: Take 12.5 mg by mouth daily as needed.  01/28/15   Larey Dresser, MD    No Known Allergies  History reviewed. No pertinent family history.  Social History Social History   Tobacco Use  . Smoking  status: Passive Smoke Exposure - Never Smoker  . Smokeless tobacco: Never Used  Substance Use Topics  . Alcohol use: No    Alcohol/week: 0.0 standard drinks  . Drug use: No    Review of Systems Constitutional: Negative for fever. Cardiovascular: Negative for chest pain. Respiratory: Positive for shortness of breath.  Mild cough. Gastrointestinal: Negative for abdominal pain Musculoskeletal: Negative for musculoskeletal complaints Neurological: Negative for headache All other ROS negative  ____________________________________________   PHYSICAL EXAM:  VITAL SIGNS: ED Triage Vitals  Enc Vitals Group     BP 02/03/19 1706 (!) 113/57     Pulse Rate 02/03/19 1706 74     Resp 02/03/19 1706 17     Temp 02/03/19 1706 98 F (36.7 C)     Temp Source 02/03/19 1706 Oral     SpO2 02/03/19 1706 99 %     Weight 02/03/19 1707 140 lb (63.5 kg)     Height 02/03/19 1707 6\' 2"  (1.88 m)     Head Circumference --      Peak Flow --  Pain Score 02/03/19 1712 0     Pain Loc --      Pain Edu? --      Excl. in [place]? --    Constitutional: Alert and oriented. Well appearing and in no distress. Eyes: Normal exam ENT      Head: Normocephalic and atraumatic.      Mouth/Throat: Mucous membranes are moist. Cardiovascular: Normal rate, regular rhythm.  Respiratory: Normal respiratory effort without tachypnea nor retractions. Breath sounds are clear  Gastrointestinal: Soft and nontender. No distention.   Musculoskeletal: Nontender with normal range of motion in all extremities. Neurologic:  Normal speech and language. No gross focal neurologic deficits  Skin:  Skin is warm, dry and intact.  Psychiatric: Mood and affect are normal.   ____________________________________________    EKG  EKG viewed and interpreted by myself shows a right bundle branch block, widened QRS rate around 69 bpm.  Nonspecific ST changes without ST elevation.  ____________________________________________     RADIOLOGY  Chest x-ray is negative  ____________________________________________   INITIAL IMPRESSION / ASSESSMENT AND PLAN / ED COURSE  Pertinent labs & imaging results that were available during my care of the patient were reviewed by me and considered in my medical decision making (see chart for details).   Patient presents to the emergency department for shortness of breath and hoarse voice over the past 1 week.  States slight cough.  Overall the patient appears well, denies any fever.  No chest pain.  Reassuring vitals currently 100% on room air during my evaluation.  Overall clear lung sounds no obvious wheeze rales or rhonchi.  Patient states occasional cough at home and feeling like he needs to clear his throat.  We will cover for acute bronchitis.  Chest x-ray is negative for pneumonia.  Lab work largely [place].  [name] was evaluated in Emergency Department on 02/03/2019 for the symptoms described in the history of present illness. He was evaluated in the context of the global COVID-19 pandemic, which necessitated consideration that the patient might be at risk for infection with the SARS-CoV-2 virus that causes COVID-19. Institutional protocols and algorithms that pertain to the evaluation of patients at risk for COVID-19 are in a state of rapid change based on information released by regulatory bodies including the CDC and federal and state organizations. These policies and algorithms were followed during the patient's care in the ED.  ____________________________________________   FINAL CLINICAL IMPRESSION(S) / ED DIAGNOSES  Bronchitis   Harvest Dark, MD 02/03/19 1945

## 2019-03-24 ENCOUNTER — Encounter: Payer: Self-pay | Admitting: Emergency Medicine

## 2019-03-24 ENCOUNTER — Emergency Department
Admission: EM | Admit: 2019-03-24 | Discharge: 2019-03-24 | Disposition: A | Discharge status: Home / Self Care | Payer: Medicare Other | Attending: Emergency Medicine | Admitting: Emergency Medicine

## 2019-03-24 ENCOUNTER — Emergency Department: Payer: Medicare Other

## 2019-03-24 ENCOUNTER — Other Ambulatory Visit: Payer: Self-pay

## 2019-03-24 DIAGNOSIS — Z8709 Personal history of other diseases of the respiratory system: Secondary | ICD-10-CM

## 2019-03-24 DIAGNOSIS — J449 Chronic obstructive pulmonary disease, unspecified: Secondary | ICD-10-CM | POA: Diagnosis not present

## 2019-03-24 DIAGNOSIS — Z7722 Contact with and (suspected) exposure to environmental tobacco smoke (acute) (chronic): Secondary | ICD-10-CM | POA: Insufficient documentation

## 2019-03-24 DIAGNOSIS — I110 Hypertensive heart disease with heart failure: Secondary | ICD-10-CM | POA: Diagnosis not present

## 2019-03-24 DIAGNOSIS — R05 Cough: Secondary | ICD-10-CM | POA: Diagnosis not present

## 2019-03-24 DIAGNOSIS — J40 Bronchitis, not specified as acute or chronic: Secondary | ICD-10-CM

## 2019-03-24 DIAGNOSIS — E039 Hypothyroidism, unspecified: Secondary | ICD-10-CM | POA: Diagnosis not present

## 2019-03-24 DIAGNOSIS — Z96652 Presence of left artificial knee joint: Secondary | ICD-10-CM | POA: Insufficient documentation

## 2019-03-24 DIAGNOSIS — Z96651 Presence of right artificial knee joint: Secondary | ICD-10-CM | POA: Insufficient documentation

## 2019-03-24 DIAGNOSIS — R0602 Shortness of breath: Secondary | ICD-10-CM | POA: Diagnosis present

## 2019-03-24 DIAGNOSIS — Z7982 Long term (current) use of aspirin: Secondary | ICD-10-CM | POA: Diagnosis not present

## 2019-03-24 DIAGNOSIS — Z79899 Other long term (current) drug therapy: Secondary | ICD-10-CM | POA: Insufficient documentation

## 2019-03-24 DIAGNOSIS — I5022 Chronic systolic (congestive) heart failure: Secondary | ICD-10-CM | POA: Diagnosis not present

## 2019-03-24 LAB — CBC
HCT: 35.7 % — ABNORMAL LOW (ref 39.0–52.0)
Hemoglobin: 12.2 g/dL — ABNORMAL LOW (ref 13.0–17.0)
MCH: 30.1 pg (ref 26.0–34.0)
MCHC: 34.2 g/dL (ref 30.0–36.0)
MCV: 88.1 fL (ref 80.0–100.0)
Platelets: 147 10*3/uL — ABNORMAL LOW (ref 150–400)
RBC: 4.05 MIL/uL — ABNORMAL LOW (ref 4.22–5.81)
RDW: 12 % (ref 11.5–15.5)
WBC: 3.3 10*3/uL — ABNORMAL LOW (ref 4.0–10.5)
nRBC: 0 % (ref 0.0–0.2)

## 2019-03-24 LAB — BASIC METABOLIC PANEL
Anion gap: 12 (ref 5–15)
BUN: 18 mg/dL (ref 8–23)
CO2: 24 mmol/L (ref 22–32)
Calcium: 9.2 mg/dL (ref 8.9–10.3)
Chloride: 100 mmol/L (ref 98–111)
Creatinine, Ser: 1.1 mg/dL (ref 0.61–1.24)
GFR calc Af Amer: >60 mL/min (ref >60)
GFR calc non Af Amer: >60 mL/min (ref >60)
Glucose, Bld: 115 mg/dL — ABNORMAL HIGH (ref 70–99)
Potassium: 4.3 mmol/L (ref 3.5–5.1)
Sodium: 136 mmol/L (ref 135–145)

## 2019-03-24 LAB — BRAIN NATRIURETIC PEPTIDE: B Natriuretic Peptide: 108 pg/mL — ABNORMAL HIGH (ref 0.0–100.0)

## 2019-03-24 LAB — TROPONIN I (HIGH SENSITIVITY): Troponin I (High Sensitivity): 12 ng/L (ref <18)

## 2019-03-24 MED ORDER — DOXYCYCLINE HYCLATE 100 MG PO CAPS: 100.0000 mg | ORAL_CAPSULE | Freq: Two times a day (BID) | ORAL | 0 refills | Status: AC

## 2019-03-24 MED ORDER — LACTATED RINGERS IV BOLUS: 1000.0000 mL | Freq: Once | INTRAVENOUS | Status: DC

## 2019-03-24 NOTE — ED Triage Notes (Signed)
Pt reports has felt weak over the last few weeks and SOB.

## 2019-03-24 NOTE — ED Provider Notes (Signed)
[facility] Emergency Department Provider Note   ____________________________________________   First MD Initiated Contact with Patient 03/24/19 1345     (approximate)  I have reviewed the triage vital signs and the nursing notes.   HISTORY  Chief Complaint Weakness and Shortness of Breath    HPI [name] is a 78 y.o. male with past medical history of atrial fibrillation, CHF, CAD, and COPD presents to the ED complaining of shortness of breath.  Patient reports that over the past couple of days he has felt short of breath at times and has had a cough productive of yellowish sputum.  He denies any fevers or chest pain and is not aware of any sick contacts.  He states he spoke with his cardiologist over the phone, who recommended he come to the ED for further evaluation.  He states he was recently treated last month for similar symptoms, when he was diagnosed with bronchitis.  He was feeling well after completing a Z-Pak, but now has worsening symptoms.        Past Medical History:  Diagnosis Date  . A-fib ([place])   . Anxiety    Xanax PRN  . Arthritis   . Asthma   . Cataracts, bilateral   . CHF (congestive heart failure) ([place])   . History of bronchitis   . History of stomach ulcers   . Hypertension   . Hypothyroidism   . Seasonal allergies   . Wears dentures    full upper, lower sub-periostal    Patient Active Problem List   Diagnosis Date Noted  . Allergic rhinitis 08/26/2015  . AI (aortic insufficiency) 03/17/2015  . Paroxysmal atrial fibrillation ([place]) 12/18/2014  . COPD (chronic obstructive pulmonary disease) ([place]) 11/26/2014  . Coronary artery disease involving native coronary artery of native heart without angina pectoris 11/05/2014  . Chronic systolic CHF (congestive heart failure) ([place]) 09/09/2014  . Persistent atrial fibrillation ([place]) 09/09/2014  . 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 Emphysema of lung ([place]) 09/09/2014  .  CHF exacerbation ([place]) 07/10/2014  . Acute systolic heart failure ([place]) 07/10/2014    Past Surgical History:  Procedure Laterality Date  . BENTALL PROCEDURE N/A 03/17/2015   Procedure: BIOLOGIC BENTALL PROCEDURE WITH 27 TISSUE VALVE AND 30 VALSALVA GRAFT;  Surgeon: Grace Isaac, MD;  Location: [place];  Service: Open Heart Surgery;  Laterality: N/A;  . CARDIAC CATHETERIZATION    . CARDIOVERSION N/A 07/13/2014   Procedure: CARDIOVERSION;  Surgeon: Dixie Dials, MD;  Location: [facility];  Service: Cardiovascular;  Laterality: N/A;  . CARDIOVERSION N/A 10/01/2014   Procedure: CARDIOVERSION;  Surgeon: Larey Dresser, MD;  Location: [place];  Service: Cardiovascular;  Laterality: N/A;  . CATARACT EXTRACTION W/PHACO Right 10/04/2016   Procedure: CATARACT EXTRACTION PHACO AND INTRAOCULAR LENS PLACEMENT ([place])  right;  Surgeon: Leandrew Koyanagi, MD;  Location: [place];  Service: Ophthalmology;  Laterality: Right;  . CATARACT EXTRACTION W/PHACO Left 10/30/2016   Procedure: CATARACT EXTRACTION PHACO AND INTRAOCULAR LENS PLACEMENT ([place])  Left;  Surgeon: Leandrew Koyanagi, MD;  Location: [place];  Service: Ophthalmology;  Laterality: Left;  Requests early  . CHOLECYSTECTOMY    . CLIPPING OF ATRIAL APPENDAGE N/A 03/17/2015   Procedure: CLIPPING OF LEFT ATRIAL APPENDAGE;  Surgeon: Grace Isaac, MD;  Location: [place];  Service: Open Heart Surgery;  Laterality: N/A;  . COLONOSCOPY    . ESOPHAGOGASTRODUODENOSCOPY    . HERNIA REPAIR    . JOINT REPLACEMENT  bilateral knee, 1/09, 6/09  . LEFT AND RIGHT HEART CATHETERIZATION WITH CORONARY ANGIOGRAM N/A 07/14/2014   Procedure: LEFT AND RIGHT HEART CATHETERIZATION WITH CORONARY ANGIOGRAM;  Surgeon: Charolette Forward, MD;  Location: [facility] CATH LAB;  Service: Cardiovascular;  Laterality: N/A;  . MAZE N/A 03/17/2015   Procedure: LEFT BIPOLAR MAZE;  Surgeon: Grace Isaac, MD;  Location: [place];  Service: Open Heart Surgery;   Laterality: N/A;  . TEE WITHOUT CARDIOVERSION N/A 07/13/2014   Procedure: TRANSESOPHAGEAL ECHOCARDIOGRAM (TEE) WITH CARDIOVERSION;  Surgeon: Dixie Dials, MD;  Location: [place];  Service: Cardiovascular;  Laterality: N/A;  . TEE WITHOUT CARDIOVERSION N/A 03/17/2015   Procedure: TRANSESOPHAGEAL ECHOCARDIOGRAM (TEE);  Surgeon: Grace Isaac, MD;  Location: [place];  Service: Open Heart Surgery;  Laterality: N/A;    Prior to Admission medications   Medication Sig Start Date End Date Taking? Authorizing Provider  ADVAIR DISKUS 250-50 MCG/DOSE AEPB Inhale 1 puff into the lungs 2 (two) times daily.  06/16/14   [provider]  ALPRAZolam Duanne Moron) 0.25 MG tablet Take 0.25 mg by mouth 2 (two) times daily as needed for anxiety or sleep.  08/10/14   [provider]  amiodarone (PACERONE) 200 MG tablet Take 1 tablet (200 mg total) by mouth daily. Patient taking differently: Take 100 mg by mouth daily.  07/15/14   Charolette Forward, MD  aspirin 81 MG chewable tablet Chew 1 tablet (81 mg total) by mouth daily. 03/26/15   Nani Skillern, PA-C  atorvastatin (LIPITOR) 20 MG tablet Take 20 mg by mouth daily at 6 PM.  06/16/14   [provider]  azithromycin (ZITHROMAX) 250 MG tablet Take 1 tablet (250 mg total) by mouth daily. 02/03/19   Harvest Dark, MD  bisacodyl (DULCOLAX) 5 MG EC tablet Take 5 mg by mouth daily as needed for moderate constipation.    [provider]  carvedilol (COREG) 6.25 MG tablet Take 1 tablet (6.25 mg total) by mouth 2 (two) times daily with a meal. 01/28/15   Larey Dresser, MD  doxycycline (VIBRAMYCIN) 100 MG capsule Take 1 capsule (100 mg total) by mouth 2 (two) times daily for 7 days. 03/24/19 03/31/19  Blake Divine, MD  levothyroxine (SYNTHROID, LEVOTHROID) 25 MCG tablet Take 25 mcg by mouth daily before breakfast.    [provider]  lisinopril (PRINIVIL,ZESTRIL) 5 MG tablet Take 0.5 tablets (2.5 mg total) by mouth 2 (two)  times daily. 09/20/15   Larey Dresser, MD  PROAIR HFA 108 [phone] BASE) MCG/ACT inhaler Inhale 1 puff into the lungs every 4 (four) hours as needed for wheezing or shortness of breath.  07/07/14   [provider]  spironolactone (ALDACTONE) 25 MG tablet Take 0.5 tablets (12.5 mg total) by mouth daily. Patient taking differently: Take 12.5 mg by mouth daily as needed.  01/28/15   Larey Dresser, MD    Allergies Patient has no known allergies.  No family history on file.  Social History Social History   Tobacco Use  . Smoking status: Passive Smoke Exposure - Never Smoker  . Smokeless tobacco: Never Used  Substance Use Topics  . Alcohol use: No    Alcohol/week: 0.0 standard drinks  . Drug use: No    Review of Systems  Constitutional: No fever/chills Eyes: No visual changes. ENT: No sore throat. Cardiovascular: Denies chest pain. Respiratory: Positive for cough and shortness of breath. Gastrointestinal: No abdominal pain.  No nausea, no vomiting.  No diarrhea.  No constipation. Genitourinary: Negative  for dysuria. Musculoskeletal: Negative for back pain. Skin: Negative for rash. Neurological: Negative for headaches, focal weakness or numbness.  ____________________________________________   PHYSICAL EXAM:  VITAL SIGNS: ED Triage Vitals  Enc Vitals Group     BP 03/24/19 1007 (!) 89/59     Pulse Rate 03/24/19 1007 (!) 52     Resp 03/24/19 1007 18     Temp 03/24/19 1007 98.1 F (36.7 C)     Temp Source 03/24/19 1007 Oral     SpO2 03/24/19 1007 99 %     Weight 03/24/19 1012 135 lb (61.2 kg)     Height 03/24/19 1012 6\' 1"  (1.854 m)     Head Circumference --      Peak Flow --      Pain Score 03/24/19 1011 0     Pain Loc --      Pain Edu? --      Excl. in [place]? --     Constitutional: Alert and oriented. Eyes: Conjunctivae are normal. Head: Atraumatic. Nose: No congestion/rhinnorhea. Mouth/Throat: Mucous membranes are moist. Neck: Normal ROM Cardiovascular:  Normal rate, regular rhythm. Grossly normal heart sounds. Respiratory: Normal respiratory effort.  No retractions. Lungs CTAB. Gastrointestinal: Soft and nontender. No distention. Genitourinary: deferred Musculoskeletal: No lower extremity tenderness nor edema. Neurologic:  Normal speech and language. No gross focal neurologic deficits are appreciated. Skin:  Skin is warm, dry and intact. No rash noted. Psychiatric: Mood and affect are normal. Speech and behavior are normal.  ____________________________________________   LABS (all labs ordered are listed, but only abnormal results are displayed)  Labs Reviewed  BASIC METABOLIC PANEL - Abnormal; Notable for the following components:      Result Value   Glucose, Bld 115 (*)    All other components within normal limits  CBC - Abnormal; Notable for the following components:   WBC 3.3 (*)    RBC 4.05 (*)    Hemoglobin 12.2 (*)    HCT 35.7 (*)    Platelets 147 (*)    All other components within normal limits  BRAIN NATRIURETIC PEPTIDE - Abnormal; Notable for the following components:   B Natriuretic Peptide 108.0 (*)    All other components within normal limits  TROPONIN I (HIGH SENSITIVITY)  TROPONIN I (HIGH SENSITIVITY)   ____________________________________________  EKG  ED ECG REPORT I, Blake Divine, the attending physician, personally viewed and interpreted this ECG.   Date: 03/24/2019  EKG Time: 10:22  Rate: 58  Rhythm: normal sinus rhythm  Axis: LAD  Intervals:right bundle branch block  ST&T Change: None   PROCEDURES  Procedure(s) performed (including Critical Care):  Procedures   ____________________________________________   INITIAL IMPRESSION / ASSESSMENT AND PLAN / ED COURSE       78 year old male with history of COPD and CHF presents to the ED with increasing productive cough as well as shortness of breath over the past couple of days.  He has not any respiratory distress on initial evaluation,  is able to speak in long sentences without any difficulty breathing and maintains O2 sats on room air.  His EKG shows old right bundle branch block with no acute ischemic changes, troponin within normal limits, but ACS.  He has no signs of heart failure on his chest x-ray and BNP is unremarkable.  Chest x-ray also negative for focal pneumonia.  Presentation appears most consistent with bronchitis and given his purulent sputum production in the setting of COPD, will treat with antibiotics.  Do not feel steroids  are indicated at this time, counseled patient to continue using his inhalers as needed.  Counseled patient to follow-up with his PCP and otherwise return to the ED for new or worsening symptoms.  Patient agrees with plan.      ____________________________________________   FINAL CLINICAL IMPRESSION(S) / ED DIAGNOSES  Final diagnoses:  Bronchitis  History of COPD     ED Discharge Orders         Ordered    doxycycline (VIBRAMYCIN) 100 MG capsule  2 times daily     03/24/19 1505           Note:  This document was prepared using Dragon voice recognition software and may include unintentional dictation errors.   Blake Divine, MD 03/24/19 [id number]

## 2019-03-31 ENCOUNTER — Ambulatory Visit: Payer: Medicare Other | Admitting: Family Medicine

## 2019-04-03 ENCOUNTER — Other Ambulatory Visit: Payer: Self-pay

## 2019-04-03 ENCOUNTER — Ambulatory Visit (INDEPENDENT_AMBULATORY_CARE_PROVIDER_SITE_OTHER): Payer: Medicare Other | Admitting: Family Medicine

## 2019-04-03 ENCOUNTER — Encounter: Payer: Self-pay | Admitting: Family Medicine

## 2019-04-03 VITALS — BP 98/58 | HR 61 | Temp 99.1°F | Resp 12 | Ht 73.0 in | Wt 142.0 lb

## 2019-04-03 DIAGNOSIS — E039 Hypothyroidism, unspecified: Secondary | ICD-10-CM

## 2019-04-03 DIAGNOSIS — J438 Other emphysema: Secondary | ICD-10-CM | POA: Diagnosis not present

## 2019-04-03 DIAGNOSIS — I5022 Chronic systolic (congestive) heart failure: Secondary | ICD-10-CM

## 2019-04-03 DIAGNOSIS — F419 Anxiety disorder, unspecified: Secondary | ICD-10-CM

## 2019-04-03 DIAGNOSIS — I4819 Other persistent atrial fibrillation: Secondary | ICD-10-CM | POA: Diagnosis not present

## 2019-04-03 DIAGNOSIS — I480 Paroxysmal atrial fibrillation: Secondary | ICD-10-CM

## 2019-04-03 HISTORY — DX: Hypothyroidism, unspecified: E03.9

## 2019-04-03 NOTE — Assessment & Plan Note (Signed)
Pt notes he has only been taking for 6 months. Highest TSH was ~5, unclear why this is prescribed. Discussed trial of stopping medication and repeat TSH and monitor for symptoms.

## 2019-04-03 NOTE — Assessment & Plan Note (Signed)
No signs of fluid overload. Stable per patient since getting valve surgery. Cont lisinopril, carvedilol.

## 2019-04-03 NOTE — Progress Notes (Signed)
Subjective:     [name] is a 78 y.o. male presenting for Establish Care     HPI  #hypothyroidism - has been taking for ~6 months - taking it less often - no hx of fatigue - told he had abnormal blood work  #a fib - on amiodarone - did not want blood thinners - told he didn't need blood thinners    Review of Systems  Constitutional: Positive for fatigue (since bronchitis).  Eyes: Positive for visual disturbance (cataract issue, chronic).  Respiratory: Negative for shortness of breath and wheezing.   Cardiovascular: Negative for chest pain.  Neurological: Negative for headaches.     Social History   Tobacco Use  Smoking Status Former Smoker  . Years: 4.00  Smokeless Tobacco Never Used  Tobacco Comment   quit at 21        Objective:    BP Readings from Last 3 Encounters:  04/03/19 (!) 98/58  03/24/19 110/60  02/03/19 (!) 145/81   Wt Readings from Last 3 Encounters:  04/03/19 142 lb (64.4 kg)  03/24/19 135 lb (61.2 kg)  02/03/19 139 lb 15.9 oz (63.5 kg)    BP (!) 98/58 (BP Location: Left Arm, Cuff Size: Normal)   Pulse 61   Temp 99.1 F (37.3 C) (Temporal)   Resp 12   Ht 6\' 1"  (1.854 m)   Wt 142 lb (64.4 kg)   SpO2 94%   BMI 18.73 kg/m    Physical Exam Constitutional:      Appearance: Normal appearance. He is not ill-appearing or diaphoretic.  HENT:     Right Ear: External ear normal.     Left Ear: External ear normal.     Nose: Nose normal.  Eyes:     General: No scleral icterus.    Extraocular Movements: Extraocular movements intact.     Conjunctiva/sclera: Conjunctivae normal.  Neck:     Musculoskeletal: Neck supple.  Cardiovascular:     Rate and Rhythm: Normal rate and regular rhythm.     Heart sounds: Murmur present.  Pulmonary:     Effort: Pulmonary effort is normal. No respiratory distress.     Breath sounds: Normal breath sounds. No wheezing or rales.  Skin:    General: Skin is warm and dry.  Neurological:   Mental Status: He is alert. Mental status is at baseline.  Psychiatric:        Mood and Affect: Mood normal.           Assessment & Plan:   Problem List Items Addressed This Visit      Cardiovascular and Mediastinum   Chronic systolic CHF (congestive heart failure) (HCC)    No signs of fluid overload. Stable per patient since getting valve surgery. Cont lisinopril, carvedilol.       Persistent atrial fibrillation (HCC)    Regular rhythm today. Cont amiodarone. Follows with cardiology      Paroxysmal atrial fibrillation (HCC)     Respiratory   COPD (chronic obstructive pulmonary disease) ([place]) - Primary    Recent exacerbation and still recovering. Clear lungs today. Cont medications        Endocrine   Hypothyroidism    Pt notes he has only been taking for 6 months. Highest TSH was ~5, unclear why this is prescribed. Discussed trial of stopping medication and repeat TSH and monitor for symptoms.       Relevant Orders   TSH     Other   Anxiety  Gets #60 once a year, and doesn't even use this much. Taking 1/2 tablet as needed          Return in about 6 months (around 10/02/2019).  Lesleigh Noe, MD

## 2019-04-03 NOTE — Assessment & Plan Note (Signed)
Gets #60 once a year, and doesn't even use this much. Taking 1/2 tablet as needed

## 2019-04-03 NOTE — Patient Instructions (Addendum)
#  Thyroid - Stop the Synthroid/Levothyroxine  - repeat labs in 6 weeks to check   #Bronchitis - Let me know if you do not continue to improve

## 2019-04-03 NOTE — Assessment & Plan Note (Signed)
Regular rhythm today. Cont amiodarone. Follows with cardiology

## 2019-04-03 NOTE — Assessment & Plan Note (Signed)
Recent exacerbation and still recovering. Clear lungs today. Cont medications

## 2019-05-15 ENCOUNTER — Other Ambulatory Visit (INDEPENDENT_AMBULATORY_CARE_PROVIDER_SITE_OTHER): Payer: Medicare PPO

## 2019-05-15 ENCOUNTER — Other Ambulatory Visit: Payer: Self-pay

## 2019-05-15 DIAGNOSIS — E039 Hypothyroidism, unspecified: Secondary | ICD-10-CM | POA: Diagnosis not present

## 2019-05-15 LAB — TSH: TSH: 0.36 u[IU]/mL (ref 0.35–4.50)

## 2019-05-15 NOTE — Addendum Note (Signed)
Addended by: Kris Mouton on: 05/15/2019 03:58 PM   Modules accepted: Orders

## 2019-10-02 ENCOUNTER — Encounter: Payer: Self-pay | Admitting: Family Medicine

## 2019-10-02 ENCOUNTER — Other Ambulatory Visit: Payer: Self-pay

## 2019-10-02 ENCOUNTER — Ambulatory Visit (INDEPENDENT_AMBULATORY_CARE_PROVIDER_SITE_OTHER): Payer: Medicare PPO | Admitting: Family Medicine

## 2019-10-02 VITALS — BP 108/68 | HR 61 | Temp 98.3°F | Ht 73.0 in | Wt 147.0 lb

## 2019-10-02 DIAGNOSIS — J438 Other emphysema: Secondary | ICD-10-CM

## 2019-10-02 DIAGNOSIS — I5022 Chronic systolic (congestive) heart failure: Secondary | ICD-10-CM

## 2019-10-02 DIAGNOSIS — I480 Paroxysmal atrial fibrillation: Secondary | ICD-10-CM

## 2019-10-02 NOTE — Assessment & Plan Note (Signed)
Stable. Follows with cardiology. Cont amiodarone.

## 2019-10-02 NOTE — Assessment & Plan Note (Signed)
Stable. Follows with cardiology. Orthostatic symptoms improved with decreasing lisinopril from 2.5 mg BID > 2.5 mg daily. Discussed addressing change with cardiology but suspect a dose decrease is appropriate given symptoms. Also advised hydration when outside in the heat. Cont carvedilol and lisinopril.

## 2019-10-02 NOTE — Assessment & Plan Note (Signed)
Doing well. Discussed taking advair BID as prescribed, and he will do. Ok with prn albuterol. He will check with pharmacy if he got the pneumonia shot and follow-up to get if he has not received it.

## 2019-10-02 NOTE — Patient Instructions (Signed)
Check with your pharmacy about what vaccines you have gotten  Call back with your vaccines

## 2019-10-02 NOTE — Progress Notes (Signed)
Subjective:     [name] is a 79 y.o. male presenting for Emphysema     HPI  #sun exposure - gets out in the sun - wears a hat and sun glasses  #cataract surgery - 2-3 years ago - vision is 20/20 now - does well with lights but occasionally bother him - walking 3-5 miles per day  #orthostatic symptoms - occasional symptoms when getting up - worse if he is out in the sun for extended periods of time - lifting weights regularly - rides a stationary bike - avoids processed meats  #cough/COPD - pollen is bothering him - using advair 2-3 times a week - will need albuterol occasionally - washes his mouth and will  - advair 1-2 times a day  Review of Systems   Social History   Tobacco Use  Smoking Status Former Smoker  . Years: 4.00  Smokeless Tobacco Never Used  Tobacco Comment   quit at 21        Objective:    BP Readings from Last 3 Encounters:  10/02/19 108/68  04/03/19 (!) 98/58  03/24/19 110/60   Wt Readings from Last 3 Encounters:  10/02/19 147 lb (66.7 kg)  04/03/19 142 lb (64.4 kg)  03/24/19 135 lb (61.2 kg)    BP 108/68   Pulse 61   Temp 98.3 F (36.8 C) (Temporal)   Ht 6\' 1"  (1.854 m)   Wt 147 lb (66.7 kg)   SpO2 99%   BMI 19.39 kg/m    Physical Exam Constitutional:      Appearance: Normal appearance. He is not ill-appearing or diaphoretic.     Comments: thin  HENT:     Right Ear: External ear normal.     Left Ear: External ear normal.     Nose: Nose normal.  Eyes:     General: No scleral icterus.    Extraocular Movements: Extraocular movements intact.     Conjunctiva/sclera: Conjunctivae normal.  Neck:     Vascular: No JVD.  Cardiovascular:     Rate and Rhythm: Normal rate and regular rhythm.     Heart sounds: No murmur.  Pulmonary:     Effort: Pulmonary effort is normal. No respiratory distress.     Breath sounds: Wheezing (scattered) present.  Musculoskeletal:     Cervical back: Neck supple.  Skin:  General: Skin is warm and dry.  Neurological:     Mental Status: He is alert. Mental status is at baseline.  Psychiatric:        Mood and Affect: Mood normal.           Assessment & Plan:   Problem List Items Addressed This Visit      Cardiovascular and Mediastinum   Chronic systolic CHF (congestive heart failure) ([place]) - Primary    Stable. Follows with cardiology. Orthostatic symptoms improved with decreasing lisinopril from 2.5 mg BID > 2.5 mg daily. Discussed addressing change with cardiology but suspect a dose decrease is appropriate given symptoms. Also advised hydration when outside in the heat. Cont carvedilol and lisinopril.       Paroxysmal atrial fibrillation (HCC)    Stable. Follows with cardiology. Cont amiodarone.         Respiratory   COPD (chronic obstructive pulmonary disease) (HCC)    Doing well. Discussed taking advair BID as prescribed, and he will do. Ok with prn albuterol. He will check with pharmacy if he got the pneumonia shot and follow-up to get if he  has not received it.           Return in about 6 months (around 04/02/2020) for medicare wellness.  Lesleigh Noe, MD

## 2019-10-03 ENCOUNTER — Telehealth: Payer: Self-pay

## 2019-10-03 NOTE — Telephone Encounter (Signed)
[place] Night - Client Nonclinical Telephone Record AccessNurse Client Page Night - Client Client Site [place] Physician Waunita Schooner- MD Contact Type Call Who Is Calling Patient / Member / Family / Caregiver Caller Name [name] Caller Phone Number [phone] Patient Name [name] Patient DOB [birth year] Call Type Message Only Information Provided Reason for Call Request for General Office Information Initial Comment Caller was seen in the office today in regards to a pneumonia shot . His pharmacy is going to give him his pneumonia shot tomorrow. Additional Comment Provided office hours Disp. Time Disposition Final User 10/02/2019 5:06:39 PM General Information Provided Yes Vinnie Level Call Closed By: Vinnie Level Transaction Date/Time: 10/02/2019 5:04:06 PM (ET)

## 2019-10-03 NOTE — Telephone Encounter (Signed)
Noted, will update chart at next visit.

## 2020-02-04 DIAGNOSIS — I10 Essential (primary) hypertension: Secondary | ICD-10-CM | POA: Diagnosis not present

## 2020-02-04 DIAGNOSIS — E785 Hyperlipidemia, unspecified: Secondary | ICD-10-CM | POA: Diagnosis not present

## 2020-02-04 DIAGNOSIS — I480 Paroxysmal atrial fibrillation: Secondary | ICD-10-CM | POA: Diagnosis not present

## 2020-02-04 DIAGNOSIS — J449 Chronic obstructive pulmonary disease, unspecified: Secondary | ICD-10-CM | POA: Diagnosis not present

## 2020-02-04 DIAGNOSIS — I509 Heart failure, unspecified: Secondary | ICD-10-CM | POA: Diagnosis not present

## 2020-02-05 ENCOUNTER — Other Ambulatory Visit: Payer: Self-pay | Admitting: Family Medicine

## 2020-02-05 DIAGNOSIS — I10 Essential (primary) hypertension: Secondary | ICD-10-CM | POA: Diagnosis not present

## 2020-02-05 DIAGNOSIS — E785 Hyperlipidemia, unspecified: Secondary | ICD-10-CM | POA: Diagnosis not present

## 2020-02-05 DIAGNOSIS — E119 Type 2 diabetes mellitus without complications: Secondary | ICD-10-CM | POA: Diagnosis not present

## 2020-04-05 ENCOUNTER — Ambulatory Visit: Payer: Medicare PPO

## 2020-04-12 ENCOUNTER — Encounter: Payer: Medicare PPO | Admitting: Family Medicine

## 2020-04-28 ENCOUNTER — Ambulatory Visit: Payer: Medicare PPO

## 2020-05-04 DIAGNOSIS — I480 Paroxysmal atrial fibrillation: Secondary | ICD-10-CM | POA: Diagnosis not present

## 2020-05-04 DIAGNOSIS — E785 Hyperlipidemia, unspecified: Secondary | ICD-10-CM | POA: Diagnosis not present

## 2020-05-04 DIAGNOSIS — E039 Hypothyroidism, unspecified: Secondary | ICD-10-CM | POA: Diagnosis not present

## 2020-05-04 DIAGNOSIS — I5020 Unspecified systolic (congestive) heart failure: Secondary | ICD-10-CM | POA: Diagnosis not present

## 2020-05-04 DIAGNOSIS — I10 Essential (primary) hypertension: Secondary | ICD-10-CM | POA: Diagnosis not present

## 2020-05-04 DIAGNOSIS — J449 Chronic obstructive pulmonary disease, unspecified: Secondary | ICD-10-CM | POA: Diagnosis not present

## 2020-06-14 ENCOUNTER — Encounter: Payer: Self-pay | Admitting: Gastroenterology

## 2020-08-03 DIAGNOSIS — E039 Hypothyroidism, unspecified: Secondary | ICD-10-CM | POA: Diagnosis not present

## 2020-08-03 DIAGNOSIS — I480 Paroxysmal atrial fibrillation: Secondary | ICD-10-CM | POA: Diagnosis not present

## 2020-08-03 DIAGNOSIS — J449 Chronic obstructive pulmonary disease, unspecified: Secondary | ICD-10-CM | POA: Diagnosis not present

## 2020-08-03 DIAGNOSIS — I5020 Unspecified systolic (congestive) heart failure: Secondary | ICD-10-CM | POA: Diagnosis not present

## 2020-08-03 DIAGNOSIS — E785 Hyperlipidemia, unspecified: Secondary | ICD-10-CM | POA: Diagnosis not present

## 2020-08-03 DIAGNOSIS — I10 Essential (primary) hypertension: Secondary | ICD-10-CM | POA: Diagnosis not present

## 2020-10-06 DIAGNOSIS — E039 Hypothyroidism, unspecified: Secondary | ICD-10-CM | POA: Diagnosis not present

## 2020-10-06 DIAGNOSIS — J449 Chronic obstructive pulmonary disease, unspecified: Secondary | ICD-10-CM | POA: Diagnosis not present

## 2020-10-06 DIAGNOSIS — I5020 Unspecified systolic (congestive) heart failure: Secondary | ICD-10-CM | POA: Diagnosis not present

## 2020-10-06 DIAGNOSIS — B029 Zoster without complications: Secondary | ICD-10-CM | POA: Diagnosis not present

## 2020-10-06 DIAGNOSIS — E785 Hyperlipidemia, unspecified: Secondary | ICD-10-CM | POA: Diagnosis not present

## 2020-10-06 DIAGNOSIS — I480 Paroxysmal atrial fibrillation: Secondary | ICD-10-CM | POA: Diagnosis not present

## 2020-10-06 DIAGNOSIS — I10 Essential (primary) hypertension: Secondary | ICD-10-CM | POA: Diagnosis not present

## 2021-01-06 DIAGNOSIS — I10 Essential (primary) hypertension: Secondary | ICD-10-CM | POA: Diagnosis not present

## 2021-01-06 DIAGNOSIS — I480 Paroxysmal atrial fibrillation: Secondary | ICD-10-CM | POA: Diagnosis not present

## 2021-01-06 DIAGNOSIS — I5020 Unspecified systolic (congestive) heart failure: Secondary | ICD-10-CM | POA: Diagnosis not present

## 2021-01-06 DIAGNOSIS — J449 Chronic obstructive pulmonary disease, unspecified: Secondary | ICD-10-CM | POA: Diagnosis not present

## 2021-01-06 DIAGNOSIS — E785 Hyperlipidemia, unspecified: Secondary | ICD-10-CM | POA: Diagnosis not present

## 2021-01-06 DIAGNOSIS — E039 Hypothyroidism, unspecified: Secondary | ICD-10-CM | POA: Diagnosis not present

## 2021-01-07 DIAGNOSIS — E785 Hyperlipidemia, unspecified: Secondary | ICD-10-CM | POA: Diagnosis not present

## 2021-01-07 DIAGNOSIS — E039 Hypothyroidism, unspecified: Secondary | ICD-10-CM | POA: Diagnosis not present

## 2021-01-07 DIAGNOSIS — I10 Essential (primary) hypertension: Secondary | ICD-10-CM | POA: Diagnosis not present

## 2021-04-07 DIAGNOSIS — I480 Paroxysmal atrial fibrillation: Secondary | ICD-10-CM | POA: Diagnosis not present

## 2021-04-07 DIAGNOSIS — I10 Essential (primary) hypertension: Secondary | ICD-10-CM | POA: Diagnosis not present

## 2021-04-07 DIAGNOSIS — I5020 Unspecified systolic (congestive) heart failure: Secondary | ICD-10-CM | POA: Diagnosis not present

## 2021-04-07 DIAGNOSIS — E785 Hyperlipidemia, unspecified: Secondary | ICD-10-CM | POA: Diagnosis not present

## 2021-07-07 DIAGNOSIS — R059 Cough, unspecified: Secondary | ICD-10-CM | POA: Diagnosis not present

## 2021-07-07 DIAGNOSIS — J069 Acute upper respiratory infection, unspecified: Secondary | ICD-10-CM | POA: Diagnosis not present

## 2021-07-21 DIAGNOSIS — I10 Essential (primary) hypertension: Secondary | ICD-10-CM | POA: Diagnosis not present

## 2021-07-21 DIAGNOSIS — I480 Paroxysmal atrial fibrillation: Secondary | ICD-10-CM | POA: Diagnosis not present

## 2021-07-21 DIAGNOSIS — E039 Hypothyroidism, unspecified: Secondary | ICD-10-CM | POA: Diagnosis not present

## 2021-07-21 DIAGNOSIS — I5020 Unspecified systolic (congestive) heart failure: Secondary | ICD-10-CM | POA: Diagnosis not present

## 2021-07-21 DIAGNOSIS — E785 Hyperlipidemia, unspecified: Secondary | ICD-10-CM | POA: Diagnosis not present

## 2021-07-27 DIAGNOSIS — E039 Hypothyroidism, unspecified: Secondary | ICD-10-CM | POA: Diagnosis not present

## 2021-07-27 DIAGNOSIS — E785 Hyperlipidemia, unspecified: Secondary | ICD-10-CM | POA: Diagnosis not present

## 2021-07-27 DIAGNOSIS — I10 Essential (primary) hypertension: Secondary | ICD-10-CM | POA: Diagnosis not present

## 2021-09-22 ENCOUNTER — Telehealth: Payer: Self-pay

## 2021-09-22 NOTE — Telephone Encounter (Signed)
Pt's friend, Sharlett Iles, called to refer this pt to [facility].  Contacted pt and he reports he would like to see Romilda Garret, due to how highly Eddie Dibbles speaks of him. He said has seen Dr. Einar Pheasant a few years ago but would prefer a male provider if possible. Advised a msg would be sent to [facility] to get him set up with a new pt apt. Pt reports he is doing well but the sooner he can get in the better. He said he only [place] about 2 miles from the clinic and can make it here on short notice if there were a cancellation. Advised pt this nurse or the office will contact him to get him scheduled as soon as they have talked to [facility]. Pt was very appreciative and verbalized understanding.

## 2021-09-22 NOTE — Telephone Encounter (Signed)
I am fine taking him on as a new patient will need Dr. Burnice Logan approval as she was the last person to see him

## 2021-09-23 NOTE — Telephone Encounter (Signed)
Fine with TOC.

## 2021-09-23 NOTE — Telephone Encounter (Signed)
Spoke with patient and appointment made for [facility] on 09/29/21

## 2021-09-28 NOTE — Telephone Encounter (Signed)
Noted. Wonderful we could get this  pt taken care of

## 2021-09-29 ENCOUNTER — Encounter: Payer: Self-pay | Admitting: Nurse Practitioner

## 2021-09-29 ENCOUNTER — Ambulatory Visit: Payer: Medicare PPO | Admitting: Nurse Practitioner

## 2021-09-29 VITALS — BP 124/62 | HR 60 | Temp 98.5°F | Ht 73.0 in | Wt 167.0 lb

## 2021-09-29 DIAGNOSIS — Z0000 Encounter for general adult medical examination without abnormal findings: Secondary | ICD-10-CM | POA: Insufficient documentation

## 2021-09-29 DIAGNOSIS — I700 Atherosclerosis of aorta: Secondary | ICD-10-CM | POA: Insufficient documentation

## 2021-09-29 DIAGNOSIS — E039 Hypothyroidism, unspecified: Secondary | ICD-10-CM

## 2021-09-29 DIAGNOSIS — N6341 Unspecified lump in right breast, subareolar: Secondary | ICD-10-CM | POA: Diagnosis not present

## 2021-09-29 DIAGNOSIS — I5022 Chronic systolic (congestive) heart failure: Secondary | ICD-10-CM | POA: Diagnosis not present

## 2021-09-29 DIAGNOSIS — J438 Other emphysema: Secondary | ICD-10-CM

## 2021-09-29 DIAGNOSIS — I351 Nonrheumatic aortic (valve) insufficiency: Secondary | ICD-10-CM | POA: Diagnosis not present

## 2021-09-29 DIAGNOSIS — R202 Paresthesia of skin: Secondary | ICD-10-CM | POA: Diagnosis not present

## 2021-09-29 NOTE — Assessment & Plan Note (Signed)
Patient currently followed by Dr. Terrence Dupont.  Continue following with cardiology as recommended and taking medication as prescribed

## 2021-09-29 NOTE — Assessment & Plan Note (Signed)
Currently maintained on Advair and albuterol.  Patient states he will take Advair sometimes once daily versus the twice a day as prescribed.  Encourage patient to take medication as prescribed as this does not keep shortness of breath and COPD symptoms under control.  Continue using albuterol as needed

## 2021-09-29 NOTE — Assessment & Plan Note (Signed)
Patient presents with breast mass.  Likely gynecomastia since patient is on spironolactone.  Did offer patient do mammogram and further work-up but patient states he does not want at this time.  Did encourage patient to reach out to office if he changes his mind.  He is following up with cardiologist in the next couple weeks he can always really iterate he is concerned there and get his opinion in regards to the spironolactone

## 2021-09-29 NOTE — Patient Instructions (Signed)
Nice to see you today I will be in touch with the lab results once I have the results See me in 6 months, sooner if you need me If you decide to do the breast imaging let me know

## 2021-09-29 NOTE — Assessment & Plan Note (Signed)
Patient presents for transfer of care.  Brief review of EMR done prior to visit

## 2021-09-29 NOTE — Assessment & Plan Note (Signed)
Ambiguous paresthesias that could be related to patient's positioning.  Describes them at bilateral ankles intermittently.  We will check basic labs inclusive of electrolytes and B12 today

## 2021-09-29 NOTE — Assessment & Plan Note (Signed)
Patient currently maintained on levothyroxine 50 mcg daily.  Pending lab results

## 2021-09-29 NOTE — Progress Notes (Signed)
Established Patient Office Visit  Subjective   Patient ID: [name], male    DOB: 17-Dec-1940  Age: 81 y.o. MRN: [id number]  Chief Complaint  Patient presents with   Establish Care    HPI Patient presents today to establish care/transfer of care:  COPD: States that he will use the advair and albuterol inhaler as needed. States no shortness of breath.  Afib: states he sees Dr. Terrence Dupont every 90 days. History of heart failure and afib. Currenlty maintained on pacerone, carvedilol, lisinopril  Hypothyroidism: Patient currently maintained on levothyroxine 50 mcg daily.  Tolerates medication well  Breast tissue: Patient ambiguous on timeline.  Does have what feels to be gynecomastia to bilateral breast.  Patient states he is working out but still has that hanging around.  Does describe some tenderness to palpation but only to palpation outside of that no tenderness.  Walks 5 miles a day and will lift weights twice a day    Review of Systems  Constitutional:  Negative for chills, fever and malaise/fatigue.  Respiratory:  Negative for cough and shortness of breath.   Cardiovascular:  Negative for chest pain and leg swelling.  Gastrointestinal:  Negative for abdominal pain, diarrhea, nausea and vomiting.       BM every 2-4 days   Genitourinary:  Negative for dysuria and frequency.       Nocturia 1-2 times  Neurological:  Positive for tingling (feet and leg numbness).  Psychiatric/Behavioral:  Negative for hallucinations and suicidal ideas.      Objective:     BP 124/62 (BP Location: Left Arm, Patient Position: Sitting, Cuff Size: Normal)   Pulse 60   Temp 98.5 F (36.9 C) (Oral)   Ht '6\' 1"'$  (1.854 m)   Wt 167 lb (75.8 kg)   SpO2 98%   BMI 22.03 kg/m  BP Readings from Last 3 Encounters:  09/29/21 124/62  10/02/19 108/68  04/03/19 (!) 98/58   Wt Readings from Last 3 Encounters:  09/29/21 167 lb (75.8 kg)  10/02/19 147 lb (66.7 kg)  04/03/19 142 lb (64.4 kg)       Physical Exam Vitals and nursing note reviewed.  Constitutional:      Appearance: Normal appearance.  HENT:     Right Ear: Tympanic membrane, ear canal and external ear normal.     Left Ear: Tympanic membrane, ear canal and external ear normal.  Cardiovascular:     Rate and Rhythm: Normal rate and regular rhythm.     Heart sounds: Normal heart sounds.  Pulmonary:     Effort: Pulmonary effort is normal.     Breath sounds: Normal breath sounds.  Neurological:     General: No focal deficit present.     Mental Status: He is alert.     Deep Tendon Reflexes:     Reflex Scores:      Bicep reflexes are 2+ on the right side and 2+ on the left side.      Patellar reflexes are 2+ on the right side and 2+ on the left side.    Comments: Bilateral upper and lower extremity strength 5/5     No results found for any visits on 09/29/21.    The ASCVD Risk score (Arnett DK, et al., 2019) failed to calculate for the following reasons:   Cannot find a previous HDL lab   Cannot find a previous total cholesterol lab    Assessment & Plan:   Problem List Items Addressed This Visit  Cardiovascular and Mediastinum   Chronic systolic CHF (congestive heart failure) ([place])    Patient currently followed by Dr. Terrence Dupont.  Continue following with cardiology as recommended and taking medication as prescribed       Relevant Orders   CBC   Comprehensive metabolic panel   AI (aortic insufficiency)   Aortic atherosclerosis (HCC)   Relevant Orders   Lipid panel     Respiratory   Emphysema of lung ([place])     Endocrine   Hypothyroidism    Patient currently maintained on levothyroxine 50 mcg daily.  Pending lab results       Relevant Medications   levothyroxine (SYNTHROID) 50 MCG tablet   Other Relevant Orders   TSH     Other   Paresthesia    Ambiguous paresthesias that could be related to patient's positioning.  Describes them at bilateral ankles intermittently.  We will check basic  labs inclusive of electrolytes and B12 today       Relevant Orders   Vitamin B12   Encounter for medical examination to establish care - Primary    Patient presents for transfer of care.  Brief review of EMR done prior to visit       Subareolar mass of right breast    Patient presents with breast mass.  Likely gynecomastia since patient is on spironolactone.  Did offer patient do mammogram and further work-up but patient states he does not want at this time.  Did encourage patient to reach out to office if he changes his mind.  He is following up with cardiologist in the next couple weeks he can always really iterate he is concerned there and get his opinion in regards to the spironolactone        Return in about 6 months (around 03/31/2022) for Check of chronic conditions.    Romilda Garret, NP

## 2021-09-30 ENCOUNTER — Telehealth: Payer: Self-pay | Admitting: Family Medicine

## 2021-09-30 LAB — LIPID PANEL
Cholesterol: 133 mg/dL (ref 0–200)
HDL: 56.9 mg/dL (ref >39.00)
LDL Cholesterol: 63 mg/dL (ref 0–99)
NonHDL: 75.78
Total CHOL/HDL Ratio: 2
Triglycerides: 65 mg/dL (ref 0.0–149.0)
VLDL: 13 mg/dL (ref 0.0–40.0)

## 2021-09-30 LAB — COMPREHENSIVE METABOLIC PANEL
ALT: 15 U/L (ref 0–53)
AST: 20 U/L (ref 0–37)
Albumin: 4.4 g/dL (ref 3.5–5.2)
Alkaline Phosphatase: 62 U/L (ref 39–117)
BUN: 20 mg/dL (ref 6–23)
CO2: 27 mEq/L (ref 19–32)
Calcium: 9.7 mg/dL (ref 8.4–10.5)
Chloride: 100 mEq/L (ref 96–112)
Creatinine, Ser: 1.3 mg/dL (ref 0.40–1.50)
GFR: 52.3 mL/min — ABNORMAL LOW (ref >60.00)
Glucose, Bld: 91 mg/dL (ref 70–99)
Potassium: 4.5 mEq/L (ref 3.5–5.1)
Sodium: 136 mEq/L (ref 135–145)
Total Bilirubin: 0.7 mg/dL (ref 0.2–1.2)
Total Protein: 7.2 g/dL (ref 6.0–8.3)

## 2021-09-30 LAB — VITAMIN B12: Vitamin B-12: 354 pg/mL (ref 211–911)

## 2021-09-30 LAB — CBC
HCT: 35.1 % — ABNORMAL LOW (ref 39.0–52.0)
Hemoglobin: 11.8 g/dL — ABNORMAL LOW (ref 13.0–17.0)
MCHC: 33.6 g/dL (ref 30.0–36.0)
MCV: 95.3 fl (ref 78.0–100.0)
Platelets: 153 10*3/uL (ref 150.0–400.0)
RBC: 3.69 Mil/uL — ABNORMAL LOW (ref 4.22–5.81)
RDW: 13.6 % (ref 11.5–15.5)
WBC: 5.2 10*3/uL (ref 4.0–10.5)

## 2021-09-30 LAB — TSH: TSH: 6.61 u[IU]/mL — ABNORMAL HIGH (ref 0.35–5.50)

## 2021-09-30 NOTE — Telephone Encounter (Signed)
Pt called and wanted to know if you can be his PCP. His last appointment was 6.3.21 with you the patient just seen [name] on 6.1.23 but pt stated he want to continue seeing you (TOC)

## 2021-09-30 NOTE — Telephone Encounter (Signed)
I am ok with this.

## 2021-10-04 ENCOUNTER — Telehealth: Payer: Self-pay | Admitting: Nurse Practitioner

## 2021-10-04 ENCOUNTER — Telehealth: Payer: Self-pay

## 2021-10-04 NOTE — Telephone Encounter (Signed)
Pt called back and said Dr Charolette Forward (pts heart doctor) needs [place] to fax over lab results, Their phone [phone]. Fax is [phone]. [facility]

## 2021-10-04 NOTE — Telephone Encounter (Signed)
I am going to see if we can add on t3 and t4. Lets see if I can add it on before we call him back

## 2021-10-04 NOTE — Telephone Encounter (Signed)
-----   Message from [place] sent at 10/04/2021 12:58 PM EDT ----- Patient advised. Patient states he has been taking thyroid medication for the last 4 months at least every morning. He is taking 50 mg daily. He is taking it on empty stomach and waits about an hour before eating, in the morning around 1 or 2 am when he wakes up. Does he need to make changes?

## 2021-10-04 NOTE — Telephone Encounter (Signed)
I sent [name] a message about this also

## 2021-10-04 NOTE — Telephone Encounter (Signed)
Lab results faxed over as requested

## 2021-10-04 NOTE — Telephone Encounter (Signed)
Patient is calling in asking about his lab results. Asking for a call back if possible.

## 2021-10-04 NOTE — Telephone Encounter (Signed)
Called patient, see lab result notes.

## 2021-10-05 ENCOUNTER — Other Ambulatory Visit: Payer: Self-pay | Admitting: Nurse Practitioner

## 2021-10-05 DIAGNOSIS — M439 Deforming dorsopathy, unspecified: Secondary | ICD-10-CM

## 2021-10-05 NOTE — Telephone Encounter (Signed)
Patient is returning a call. °

## 2021-10-05 NOTE — Telephone Encounter (Signed)
Per Terri not able to add additional testing

## 2021-10-05 NOTE — Telephone Encounter (Signed)
Left message for patient to call back. Also, can repeat thyroid level in 8 weeks to see where the levels are. Also, if patient asks lab results were faxed over to his cardiologist as requested

## 2021-10-05 NOTE — Telephone Encounter (Signed)
If he is not having any increased fatigue and feels ok there is not need to change medication at this time

## 2021-10-06 NOTE — Telephone Encounter (Signed)
Left message to call back  

## 2021-10-06 NOTE — Telephone Encounter (Signed)
Pt is scheduled out in August for his thyroid recheck. He said he has felt increased fatigue recently as well. He is also aware about [place] faxing the info

## 2021-10-06 NOTE — Telephone Encounter (Signed)
Patient is returning our phone call States he missed a call from [place] on Thursday 6.7.23, he was concerned it was relat

## 2021-10-11 NOTE — Telephone Encounter (Signed)
If he has been taking the medication like he is suppose to and not missed dose we can move of the lab appointment if he desires

## 2021-10-12 ENCOUNTER — Other Ambulatory Visit: Payer: Self-pay | Admitting: Nurse Practitioner

## 2021-10-12 DIAGNOSIS — E039 Hypothyroidism, unspecified: Secondary | ICD-10-CM

## 2021-10-12 NOTE — Telephone Encounter (Signed)
Orders placed.

## 2021-10-12 NOTE — Telephone Encounter (Signed)
Patient called back did not want to come until 2 weeks for lab. I have made appointment. Did not see orders in.

## 2021-10-12 NOTE — Telephone Encounter (Signed)
Left message to call back to move up lab appointment to anytime now instead of waiting

## 2021-10-20 DIAGNOSIS — I480 Paroxysmal atrial fibrillation: Secondary | ICD-10-CM | POA: Diagnosis not present

## 2021-10-20 DIAGNOSIS — E785 Hyperlipidemia, unspecified: Secondary | ICD-10-CM | POA: Diagnosis not present

## 2021-10-20 DIAGNOSIS — I5020 Unspecified systolic (congestive) heart failure: Secondary | ICD-10-CM | POA: Diagnosis not present

## 2021-10-20 DIAGNOSIS — I10 Essential (primary) hypertension: Secondary | ICD-10-CM | POA: Diagnosis not present

## 2021-10-26 ENCOUNTER — Other Ambulatory Visit (INDEPENDENT_AMBULATORY_CARE_PROVIDER_SITE_OTHER): Payer: Medicare PPO

## 2021-10-26 DIAGNOSIS — E039 Hypothyroidism, unspecified: Secondary | ICD-10-CM

## 2021-10-26 LAB — T4, FREE: Free T4: 1.17 ng/dL (ref 0.60–1.60)

## 2021-10-26 LAB — T3, FREE: T3, Free: 2.6 pg/mL (ref 2.3–4.2)

## 2021-10-26 LAB — TSH: TSH: 4.62 u[IU]/mL (ref 0.35–5.50)

## 2021-10-27 ENCOUNTER — Telehealth: Payer: Self-pay

## 2021-10-27 NOTE — Telephone Encounter (Signed)
I left a message for the patient to return my call.

## 2021-10-27 NOTE — Telephone Encounter (Addendum)
Error

## 2021-10-27 NOTE — Telephone Encounter (Signed)
-----   Message from Michela Pitcher, NP sent at 10/27/2021  7:33 AM EDT ----- Thyroid numbers look good. No need to change medication at this time

## 2021-10-27 NOTE — Telephone Encounter (Signed)
Patient has been informed.

## 2021-11-09 NOTE — Telephone Encounter (Signed)
noted 

## 2021-11-30 ENCOUNTER — Other Ambulatory Visit: Payer: Medicare PPO

## 2021-12-05 ENCOUNTER — Ambulatory Visit: Payer: Self-pay | Admitting: Internal Medicine

## 2021-12-07 ENCOUNTER — Telehealth: Payer: Self-pay | Admitting: Nurse Practitioner

## 2021-12-07 NOTE — Telephone Encounter (Signed)
Labs were normal and I do not see why he needs to have it rechecked currently

## 2021-12-07 NOTE — Telephone Encounter (Signed)
Please advised. Level was normal on 10/26/21

## 2021-12-07 NOTE — Telephone Encounter (Signed)
Patient called in wanting to know if he needs to have his thyroid recheck. Stated he wanted to give the medicine a few weeks to work. He wants to know if he can have it rechecked, or just wait. Please advise. Thank you!

## 2021-12-08 NOTE — Telephone Encounter (Signed)
Patient notified labs are not needed at this time.

## 2022-01-19 DIAGNOSIS — E785 Hyperlipidemia, unspecified: Secondary | ICD-10-CM | POA: Diagnosis not present

## 2022-01-19 DIAGNOSIS — I5020 Unspecified systolic (congestive) heart failure: Secondary | ICD-10-CM | POA: Diagnosis not present

## 2022-01-19 DIAGNOSIS — I10 Essential (primary) hypertension: Secondary | ICD-10-CM | POA: Diagnosis not present

## 2022-01-19 DIAGNOSIS — E039 Hypothyroidism, unspecified: Secondary | ICD-10-CM | POA: Diagnosis not present

## 2022-02-03 DIAGNOSIS — I10 Essential (primary) hypertension: Secondary | ICD-10-CM | POA: Diagnosis not present

## 2022-02-03 DIAGNOSIS — E785 Hyperlipidemia, unspecified: Secondary | ICD-10-CM | POA: Diagnosis not present

## 2022-02-03 DIAGNOSIS — E039 Hypothyroidism, unspecified: Secondary | ICD-10-CM | POA: Diagnosis not present

## 2022-03-27 ENCOUNTER — Emergency Department
Admission: EM | Admit: 2022-03-27 | Discharge: 2022-03-27 | Disposition: A | Discharge status: Home / Self Care | Payer: Medicare PPO

## 2022-03-27 NOTE — ED Notes (Addendum)
Patient encouraged to stay but does not want to stay due to wait time. Patient states he will come back in AM. NAD noted ambulatory upon leaving.

## 2022-03-28 ENCOUNTER — Emergency Department: Payer: Medicare PPO

## 2022-03-28 ENCOUNTER — Encounter: Payer: Self-pay | Admitting: Emergency Medicine

## 2022-03-28 ENCOUNTER — Emergency Department
Admission: EM | Admit: 2022-03-28 | Discharge: 2022-03-28 | Disposition: A | Discharge status: Home / Self Care | Payer: Medicare PPO | Attending: Emergency Medicine | Admitting: Emergency Medicine

## 2022-03-28 DIAGNOSIS — J449 Chronic obstructive pulmonary disease, unspecified: Secondary | ICD-10-CM | POA: Diagnosis not present

## 2022-03-28 DIAGNOSIS — I110 Hypertensive heart disease with heart failure: Secondary | ICD-10-CM | POA: Insufficient documentation

## 2022-03-28 DIAGNOSIS — R55 Syncope and collapse: Secondary | ICD-10-CM | POA: Diagnosis present

## 2022-03-28 DIAGNOSIS — I509 Heart failure, unspecified: Secondary | ICD-10-CM | POA: Diagnosis not present

## 2022-03-28 LAB — CBC
HCT: 36.5 % — ABNORMAL LOW (ref 39.0–52.0)
Hemoglobin: 11.9 g/dL — ABNORMAL LOW (ref 13.0–17.0)
MCH: 31.4 pg (ref 26.0–34.0)
MCHC: 32.6 g/dL (ref 30.0–36.0)
MCV: 96.3 fL (ref 80.0–100.0)
Platelets: 164 10*3/uL (ref 150–400)
RBC: 3.79 MIL/uL — ABNORMAL LOW (ref 4.22–5.81)
RDW: 12.3 % (ref 11.5–15.5)
WBC: 5.7 10*3/uL (ref 4.0–10.5)
nRBC: 0 % (ref 0.0–0.2)

## 2022-03-28 LAB — BASIC METABOLIC PANEL
Anion gap: 7 (ref 5–15)
BUN: 20 mg/dL (ref 8–23)
CO2: 25 mmol/L (ref 22–32)
Calcium: 9.3 mg/dL (ref 8.9–10.3)
Chloride: 103 mmol/L (ref 98–111)
Creatinine, Ser: 1.18 mg/dL (ref 0.61–1.24)
GFR, Estimated: >60 mL/min (ref >60)
Glucose, Bld: 105 mg/dL — ABNORMAL HIGH (ref 70–99)
Potassium: 4.3 mmol/L (ref 3.5–5.1)
Sodium: 135 mmol/L (ref 135–145)

## 2022-03-28 LAB — TROPONIN I (HIGH SENSITIVITY): Troponin I (High Sensitivity): 14 ng/L (ref <18)

## 2022-03-28 NOTE — ED Provider Notes (Signed)
[facility] Provider Note    Event Date/Time   First MD Initiated Contact with Patient 03/28/22 [phone]     (approximate)  History   Chief Complaint: Near Syncope and Fall  HPI  [name] is a 81 y.o. male with a past medical history of atrial fibrillation, CHF, COPD, hypertension, hyperlipidemia, anxiety, presents to the emergency department after a syncopal or near syncopal episode 2 days ago.  According to the patient he had been somewhat constipated over the past week, states he took several laxatives and on Sunday was having diarrhea.  Patient states after multiple episodes of diarrhea he was on the toilet straining very hard and trying to get all of the stool out per patient.  States after a prolonged amount of straining he became lightheaded dizzy and slumped down to the ground.  Patient states he then tried to stand up and he believes he had a full syncopal episode at that time.  Patient states after 15 or 20 minutes he felt back to normal.  Patient did go to urgent care and they suggested he come to the emergency department which the patient did.  Patient denies any chest pain at any point.  Denies any shortness of breath.  Patient states he feels back to normal.  Physical Exam   Triage Vital Signs: ED Triage Vitals  Enc Vitals Group     BP 03/28/22 0438 133/85     Pulse Rate 03/28/22 0438 67     Resp 03/28/22 0438 20     Temp 03/28/22 0438 97.7 F (36.5 C)     Temp Source 03/28/22 0438 Oral     SpO2 03/28/22 0438 96 %     Weight 03/28/22 0442 167 lb 15.9 oz (76.2 kg)     Height 03/28/22 0442 '6\' 1"'$  (1.854 m)     Head Circumference --      Peak Flow --      Pain Score 03/28/22 0442 4     Pain Loc --      Pain Edu? --      Excl. in [place]? --     Most recent vital signs: Vitals:   03/28/22 0438  BP: 133/85  Pulse: 67  Resp: 20  Temp: 97.7 F (36.5 C)  SpO2: 96%    General: Awake, no distress.  CV:  Good peripheral perfusion.  Regular  rate and rhythm  Resp:  Normal effort.  Equal breath sounds bilaterally.  Abd:  No distention.  Soft, nontender.  No rebound or guarding.    ED Results / Procedures / Treatments   EKG  EKG viewed and interpreted by myself shows an irregular rhythm around 65 bpm with a widened QRS, normal axis, largely normal intervals and nonspecific ST changes.  No ST elevation.  RADIOLOGY  Chest x-ray is negative   MEDICATIONS ORDERED IN ED: Medications - No data to display   IMPRESSION / MDM / [place] / ED COURSE  I reviewed the triage vital signs and the nursing notes.  Patient's presentation is most consistent with acute presentation with potential threat to life or bodily function.  Patient presents emergency department for a near syncopal episode for syncopal episode after significant straining 2 days ago.  Overall the patient appears well.  Patient's workup is reassuring occluding a normal CBC, reassuring chemistry and a negative troponin.  EKG shows no concerning findings and the chest x-ray is clear.  Patient's story is very suggestive of syncopal  episode due to decreased venous return likely due to from significant straining.  Patient stresses how hard he was straining before this occurred.  I discussed with the patient to discontinue use of laxatives but he could use a daily stool softener such as Colace if desired.  Did consider admission however given the symptoms occurred 2 days ago and the patient's heart enzymes negative labs are reassuring I believe the patient is safe for discharge home to follow-up with his PCP.  FINAL CLINICAL IMPRESSION(S) / ED DIAGNOSES   Syncope    Note:  This document was prepared using Dragon voice recognition software and may include unintentional dictation errors.   Harvest Dark, MD 03/28/22 [phone]

## 2022-03-28 NOTE — ED Triage Notes (Signed)
Pt presents via POV with complaints of a syncopal episode that occurred on Sunday where he fell on the right side of his chest causing pain. Pt was seen at an UC and recommended that he come to the ED for a cardiac work up. Pt denies CP, SOB, LOC, hitting his head.

## 2022-03-30 ENCOUNTER — Telehealth: Payer: Self-pay

## 2022-03-30 NOTE — Progress Notes (Addendum)
    Chronic Care Management Pharmacy Assistant   Name: [name]  MRN: [id number] DOB: Jan 17, 1941  Reason for Encounter: Non-CCM [facility] Follow-Up)  Medications: Outpatient Encounter Medications as of 03/30/2022  Medication Sig   ADVAIR DISKUS 250-50 MCG/DOSE AEPB Inhale 1 puff into the lungs daily.   amiodarone (PACERONE) 200 MG tablet Take 1 tablet (200 mg total) by mouth daily. (Patient taking differently: Take 100 mg by mouth daily.)   aspirin 81 MG chewable tablet Chew 1 tablet (81 mg total) by mouth daily.   atorvastatin (LIPITOR) 20 MG tablet Take 10 mg by mouth daily at 6 PM.   carvedilol (COREG) 6.25 MG tablet Take 1 tablet (6.25 mg total) by mouth 2 (two) times daily with a meal.   levothyroxine (SYNTHROID) 50 MCG tablet Take 50 mcg by mouth daily.   lisinopril (PRINIVIL,ZESTRIL) 5 MG tablet Take 0.5 tablets (2.5 mg total) by mouth 2 (two) times daily.   PROAIR HFA 108 (90 BASE) MCG/ACT inhaler Inhale 1 puff into the lungs 2 (two) times daily.   spironolactone (ALDACTONE) 25 MG tablet Take 0.5 tablets (12.5 mg total) by mouth daily.   No facility-administered encounter medications on file as of 03/30/2022.   Reviewed hospital notes for details of recent visit. 03/27/2022 [facility] "Patient encouraged to stay but does not want to stay due to wait time. Patient states he will come back in AM. NAD noted ambulatory upon leaving."  Has patient been contacted by Transitions of Care team? No Has patient seen PCP/specialist for hospital follow up (summarize OV if yes): No  Admitted to the ED on 03/28/2022. Discharge date was 03/28/2022.  Discharged from [facility].   Discharge diagnosis (Principal Problem): Syncope Patient was discharged to Home  Brief summary of hospital course: "Patient presents emergency department for a near syncopal episode for syncopal episode after significant straining 2 days ago.  Overall the patient appears well.   Patient's workup is reassuring occluding a normal CBC, reassuring chemistry and a negative troponin.  EKG shows no concerning findings and the chest x-ray is clear.  Patient's story is very suggestive of syncopal episode due to decreased venous return likely due to from significant straining.  Patient stresses how hard he was straining before this occurred.  I discussed with the patient to discontinue use of laxatives but he could use a daily stool softener such as Colace if desired.  Did consider admission however given the symptoms occurred 2 days ago and the patient's heart enzymes negative labs are reassuring I believe the patient is safe for discharge home to follow-up with his PCP."  Medications that remain the same after Hospital Discharge:??  -All other medications will remain the same.    Next CCM appt: Non-CCM  Other upcoming appts: PCP appointment on 04/11/2022  Charlene Brooke, PharmD notified and will determine if action is needed.  Marijean Niemann, [place] Pharmacy Assistant [phone]   Pharmacist addendum: Pt has PCP appt scheduled. No further action needed.  Charlene Brooke, PharmD, BCACP 03/30/22 2:32 PM

## 2022-04-04 ENCOUNTER — Telehealth: Payer: Self-pay

## 2022-04-04 NOTE — Telephone Encounter (Signed)
        Patient  visited 11/28 on [place] encounter attempt :  1st  A HIPAA compliant voice message was left requesting a return call.  Instructed patient to call back     [place], [place] Management  [phone] [address], [place],  [address] Phone: [phone] Email: [email or web address]

## 2022-04-05 ENCOUNTER — Telehealth: Payer: Self-pay

## 2022-04-05 NOTE — Telephone Encounter (Signed)
        Patient  visited [place] on 11/28     Telephone encounter attempt :  2nd  A HIPAA compliant voice message was left requesting a return call.  Instructed patient to call back    [place], [place] Management  [phone] [address], [place], [place] [address] Phone: [phone] Email: [email or web address]

## 2022-04-11 ENCOUNTER — Encounter: Payer: Self-pay | Admitting: Nurse Practitioner

## 2022-04-11 ENCOUNTER — Ambulatory Visit: Payer: Medicare PPO | Admitting: Nurse Practitioner

## 2022-04-11 VITALS — BP 114/68 | HR 64 | Temp 97.6°F | Resp 16 | Ht 73.0 in | Wt 170.1 lb

## 2022-04-11 DIAGNOSIS — I4819 Other persistent atrial fibrillation: Secondary | ICD-10-CM

## 2022-04-11 DIAGNOSIS — Z0000 Encounter for general adult medical examination without abnormal findings: Secondary | ICD-10-CM | POA: Diagnosis not present

## 2022-04-11 DIAGNOSIS — E038 Other specified hypothyroidism: Secondary | ICD-10-CM

## 2022-04-11 DIAGNOSIS — J432 Centrilobular emphysema: Secondary | ICD-10-CM

## 2022-04-11 NOTE — Progress Notes (Signed)
Established Patient Office Visit  Subjective   Patient ID: [name], male    DOB: 06/18/1940  Age: 81 y.o. MRN: [id number]  Chief Complaint  Patient presents with   Thyroid Problem   Annual Exam      COPD: States that he lifts weights and will use the inhalers as needed.   Hypothyroidism: States that he will do 58mg x3 and 596m x4  Cardiology: Dr McLean/ Dr. HaTerrence Dupontor complete physical and follow up of chronic conditions.  Immunizations: -Tetanus: [facility] States up to date at pharmacy -Shingles: 04/19/2021 -Pneumonia:  had both  -HPV: aged out  Diet: FaBaldwinEats often per patient report. States that he will have what he wants. He will drink black coffee and hot tea. Drinks water. Once a day soft drink Exercise: No regular exercise. States that he will lift weights daily. Time varies   Eye exam: States that he has had a cartact surgery has been approx ayear. Dental exam: Need updating. Has lower implant and upper dentures   Colonoscopy: Completed in 2011 with recall in 10 years Lung Cancer Screening: NA Dexa: NA  PSA: Aged out.  Sleep: States that he will lay in bed a lot watching tv and relaxing. States that he does not like cold weather.  States that he goes to bed 6-7pm and will get up sometimes to urinate, he will get up6-8      Review of Systems  Constitutional:  Negative for chills and fever.  Respiratory:  Negative for shortness of breath.   Cardiovascular:  Negative for chest pain and leg swelling.  Gastrointestinal:  Negative for abdominal pain, nausea and vomiting.       BM every 4 days but softer  Genitourinary:  Negative for dysuria, frequency and urgency.       "+" nocturia      Objective:     BP 114/68   Pulse 64   Temp 97.6 F (36.4 C)   Resp 16   Ht '6\' 1"'$  (1.854 m)   Wt 170 lb 2 oz (77.2 kg)   SpO2 98%   BMI 22.45 kg/m    Physical Exam Vitals and nursing note reviewed.   Constitutional:      Appearance: Normal appearance.  HENT:     Right Ear: Tympanic membrane, ear canal and external ear normal.     Left Ear: Tympanic membrane, ear canal and external ear normal.     Mouth/Throat:     Mouth: Mucous membranes are moist.     Pharynx: Oropharynx is clear.  Eyes:     Extraocular Movements: Extraocular movements intact.     Pupils: Pupils are equal, round, and reactive to light.  Cardiovascular:     Rate and Rhythm: Normal rate and regular rhythm.     Heart sounds: Normal heart sounds.  Pulmonary:     Effort: Pulmonary effort is normal.     Breath sounds: Normal breath sounds.  Abdominal:     General: Bowel sounds are normal. There is no distension.     Palpations: There is no mass.     Tenderness: There is no abdominal tenderness.     Hernia: No hernia is present.  Musculoskeletal:     Right lower leg: No edema.     Left lower leg: No edema.  Lymphadenopathy:     Cervical: No cervical adenopathy.  Neurological:     Mental Status: He is alert.  No results found for any visits on 04/11/22.    The 10-year ASCVD risk score (Arnett DK, et al., 2019) is: 27%    Assessment & Plan:   Problem List Items Addressed This Visit       Cardiovascular and Mediastinum   Persistent atrial fibrillation [facility])    Patient currently followed by Dr. Terrence Dupont cardiologist.  Continue amiodarone        Respiratory   COPD (chronic obstructive pulmonary disease) ([place])    Patient currently maintained on Advair and albuterol inhaler.  Continue medication as prescribed        Endocrine   Hypothyroidism    Last TSH within normal limits.  Patient does Monday Wednesday Friday of 75 mcg of levothyroxine the rest of the week 50 mcg per patient report.  States Dr. Terrence Dupont is the one that wanted it this way.        Other   Preventative health care - Primary    Discussed age-appropriate immunizations and screening exams.  Patient was given information at  discharge in regards to preventative healthcare maintenance for his age range with anticipatory guidance.  Patient states he had labs drawn at Dr. Zenia Resides recently ROI signed in office today.       Return in about 6 months (around 10/11/2022) for recheck .    Romilda Garret, NP

## 2022-04-11 NOTE — Assessment & Plan Note (Signed)
Discussed age-appropriate immunizations and screening exams.  Patient was given information at discharge in regards to preventative healthcare maintenance for his age range with anticipatory guidance.  Patient states he had labs drawn at Dr. Zenia Resides recently ROI signed in office today.

## 2022-04-11 NOTE — Assessment & Plan Note (Signed)
Patient currently maintained on Advair and albuterol inhaler.  Continue medication as prescribed

## 2022-04-11 NOTE — Assessment & Plan Note (Signed)
Patient currently followed by Dr. Terrence Dupont cardiologist.  Continue amiodarone

## 2022-04-11 NOTE — Patient Instructions (Signed)
Nice to see you today I want to see you in 6 months, sooner if you need me Follow up sooner if you need me  If you decide you want a colonoscopy let me know

## 2022-04-11 NOTE — Assessment & Plan Note (Signed)
Last TSH within normal limits.  Patient does Monday Wednesday Friday of 75 mcg of levothyroxine the rest of the week 50 mcg per patient report.  States Dr. Terrence Dupont is the one that wanted it this way.

## 2022-05-29 ENCOUNTER — Encounter: Payer: Self-pay | Admitting: Nurse Practitioner

## 2022-05-29 ENCOUNTER — Ambulatory Visit: Payer: Medicare PPO | Admitting: Nurse Practitioner

## 2022-05-29 ENCOUNTER — Ambulatory Visit (INDEPENDENT_AMBULATORY_CARE_PROVIDER_SITE_OTHER)
Admission: RE | Admit: 2022-05-29 | Discharge: 2022-05-29 | Disposition: A | Discharge status: Home / Self Care | Payer: Medicare PPO | Source: Ambulatory Visit | Attending: Nurse Practitioner | Admitting: Nurse Practitioner

## 2022-05-29 ENCOUNTER — Telehealth: Payer: Self-pay

## 2022-05-29 VITALS — BP 108/62 | HR 56 | Ht 73.0 in | Wt 164.0 lb

## 2022-05-29 DIAGNOSIS — M25551 Pain in right hip: Secondary | ICD-10-CM | POA: Diagnosis not present

## 2022-05-29 DIAGNOSIS — M1611 Unilateral primary osteoarthritis, right hip: Secondary | ICD-10-CM | POA: Diagnosis not present

## 2022-05-29 DIAGNOSIS — J439 Emphysema, unspecified: Secondary | ICD-10-CM | POA: Diagnosis not present

## 2022-05-29 DIAGNOSIS — R059 Cough, unspecified: Secondary | ICD-10-CM | POA: Diagnosis not present

## 2022-05-29 DIAGNOSIS — J22 Unspecified acute lower respiratory infection: Secondary | ICD-10-CM | POA: Insufficient documentation

## 2022-05-29 MED ORDER — DOXYCYCLINE HYCLATE 100 MG PO TABS: 100.0000 mg | ORAL_TABLET | Freq: Two times a day (BID) | ORAL | 0 refills | Status: AC

## 2022-05-29 NOTE — Telephone Encounter (Signed)
Patient seen in office today. Nothing further needed at this time.

## 2022-05-29 NOTE — Telephone Encounter (Signed)
Per appt notes pt already has appt scheduled with Romilda Garret NP 05/29/22 at 10:40. Sending note to [place] pool.

## 2022-05-29 NOTE — Progress Notes (Signed)
Acute Office Visit  Subjective:     Patient ID: [name], male    DOB: [birth year], 82 y.o.   MRN: [id number]  Chief Complaint  Patient presents with   Cough    X2w, productive cough, denies wheeze/shob/fevers/chills   Nasal Congestion    x2w     Patient is in today for symptoms started approx 2 weeks ago Sick contacts: no sick contacts Covid vaccine: pfizer x1 PNA: UTD Flu vaccine UTD States that he has taken a whole bottle of cough medication. States it was helpful  Patient states he is not having uses albuterol inhaler any more than normal.   Review of Systems  Constitutional:  Positive for chills and malaise/fatigue. Negative for fever.  HENT:  Positive for congestion. Negative for ear discharge, ear pain, sinus pain and sore throat.   Respiratory:  Positive for cough and sputum production.   Musculoskeletal:  Negative for joint pain and myalgias.  Neurological:  Negative for headaches.        Objective:    BP 108/62   Pulse (!) 56   Ht '6\' 1"'$  (1.854 m)   Wt 164 lb (74.4 kg)   SpO2 96%   BMI 21.64 kg/m    Physical Exam Vitals and nursing note reviewed.  Constitutional:      Appearance: Normal appearance.  HENT:     Right Ear: Tympanic membrane, ear canal and external ear normal.     Left Ear: Tympanic membrane, ear canal and external ear normal.     Nose:     Right Sinus: No maxillary sinus tenderness or frontal sinus tenderness.     Left Sinus: No maxillary sinus tenderness or frontal sinus tenderness.     Mouth/Throat:     Mouth: Mucous membranes are moist.     Pharynx: Oropharynx is clear.  Cardiovascular:     Rate and Rhythm: Normal rate and regular rhythm.     Heart sounds: Normal heart sounds.  Pulmonary:     Effort: Pulmonary effort is normal.     Breath sounds: Rales (RLL) present.  Musculoskeletal:       Legs:     Comments: Full range of motion.  Was able to elicit discomfort in office.  Patient was lying flat weight is up to send  position he is able to elicit some discomfort.  Neurological:     Mental Status: He is alert.     Gait: Gait is intact.     No results found for any visits on 05/29/22.      Assessment & Plan:   Problem List Items Addressed This Visit       Respiratory   Lower resp. tract infection - Primary    Concern for lower respiratory tract infection.  Pending chest x-ray.  Going to treat with doxycycline 100 mg twice daily for 7 days.      Relevant Medications   doxycycline (VIBRA-TABS) 100 MG tablet   Other Relevant Orders   DG Chest 2 View     Other   Right hip pain    Has been since the end of November 2023 when patient had a vagal episode and fell.  States it is improving but still hurts.  Will obtain film of hip in office.  If normal and not improving follow-up with Dr. Frederico Hamman Copland.      Relevant Orders   DG Hip Unilat W OR W/O Pelvis 2-3 Views Right    Meds ordered this encounter  Medications   doxycycline (VIBRA-TABS) 100 MG tablet    Sig: Take 1 tablet (100 mg total) by mouth 2 (two) times daily for 7 days.    Dispense:  14 tablet    Refill:  0    Order Specific Question:   Supervising Provider    Answer:   TOWER, MARNE A [[id number]    Return if symptoms worsen or fail to improve.  Romilda Garret, NP

## 2022-05-29 NOTE — Assessment & Plan Note (Signed)
Concern for lower respiratory tract infection.  Pending chest x-ray.  Going to treat with doxycycline 100 mg twice daily for 7 days.

## 2022-05-29 NOTE — Patient Instructions (Signed)
Nice to see you today I will be in touch with the xrays once I have them I have sent an antibiotic to the pharmacy Follow up if you do not improve

## 2022-05-29 NOTE — Assessment & Plan Note (Signed)
Has been since the end of November 2023 when patient had a vagal episode and fell.  States it is improving but still hurts.  Will obtain film of hip in office.  If normal and not improving follow-up with Dr. Frederico Hamman Copland.

## 2022-05-29 NOTE — Telephone Encounter (Signed)
[facility] Night - Client Nonclinical Telephone Record  [facility] Client [facility] Night - Client Client [employer] - Night Provider Romilda Garret- NP Contact Type Call Who Is Calling Patient / Member / Family / Caregiver Caller Name Waylyn Tenbrink Caller Phone Number [phone] Patient Name [name] Patient DOB 1941-02-18 Call Type Message Only Information Provided Reason for Call Request to Schedule Office Appointment Initial Comment Caller states that he has been sick about 2 weeks, and is not feeling well and would like to see if he can get in tomorrow to see the doctor Patient request to speak to RN No Additional Comment If you can not reach him on other phone number [phone] Disp. Time Disposition Final User 05/28/2022 12:56:40 PM General Information Provided Yes Loni Beckwith Call Closed By: Loni Beckwith Transaction Date/Time: 05/28/2022 12:50:27 PM (ET

## 2022-08-01 DIAGNOSIS — I10 Essential (primary) hypertension: Secondary | ICD-10-CM | POA: Diagnosis not present

## 2022-08-01 DIAGNOSIS — E039 Hypothyroidism, unspecified: Secondary | ICD-10-CM | POA: Diagnosis not present

## 2022-08-01 DIAGNOSIS — I5022 Chronic systolic (congestive) heart failure: Secondary | ICD-10-CM | POA: Diagnosis not present

## 2022-08-01 DIAGNOSIS — E782 Mixed hyperlipidemia: Secondary | ICD-10-CM | POA: Diagnosis not present

## 2022-11-01 DIAGNOSIS — E039 Hypothyroidism, unspecified: Secondary | ICD-10-CM | POA: Diagnosis not present

## 2022-11-01 DIAGNOSIS — I10 Essential (primary) hypertension: Secondary | ICD-10-CM | POA: Diagnosis not present

## 2022-11-01 DIAGNOSIS — I5022 Chronic systolic (congestive) heart failure: Secondary | ICD-10-CM | POA: Diagnosis not present

## 2022-11-01 DIAGNOSIS — E782 Mixed hyperlipidemia: Secondary | ICD-10-CM | POA: Diagnosis not present

## 2022-11-06 DIAGNOSIS — I5022 Chronic systolic (congestive) heart failure: Secondary | ICD-10-CM | POA: Diagnosis not present

## 2022-11-06 DIAGNOSIS — E039 Hypothyroidism, unspecified: Secondary | ICD-10-CM | POA: Diagnosis not present

## 2022-11-06 DIAGNOSIS — I10 Essential (primary) hypertension: Secondary | ICD-10-CM | POA: Diagnosis not present

## 2022-11-06 DIAGNOSIS — E782 Mixed hyperlipidemia: Secondary | ICD-10-CM | POA: Diagnosis not present

## 2023-01-31 DIAGNOSIS — I10 Essential (primary) hypertension: Secondary | ICD-10-CM | POA: Diagnosis not present

## 2023-01-31 DIAGNOSIS — I5022 Chronic systolic (congestive) heart failure: Secondary | ICD-10-CM | POA: Diagnosis not present

## 2023-01-31 DIAGNOSIS — E039 Hypothyroidism, unspecified: Secondary | ICD-10-CM | POA: Diagnosis not present

## 2023-01-31 DIAGNOSIS — E782 Mixed hyperlipidemia: Secondary | ICD-10-CM | POA: Diagnosis not present

## 2023-02-07 ENCOUNTER — Ambulatory Visit: Payer: Medicare PPO | Admitting: Nurse Practitioner

## 2023-02-07 ENCOUNTER — Encounter: Payer: Self-pay | Admitting: Nurse Practitioner

## 2023-02-07 VITALS — BP 124/80 | HR 50 | Temp 97.7°F | Ht 73.0 in | Wt 178.6 lb

## 2023-02-07 DIAGNOSIS — R600 Localized edema: Secondary | ICD-10-CM

## 2023-02-07 DIAGNOSIS — R208 Other disturbances of skin sensation: Secondary | ICD-10-CM | POA: Insufficient documentation

## 2023-02-07 DIAGNOSIS — R29898 Other symptoms and signs involving the musculoskeletal system: Secondary | ICD-10-CM | POA: Diagnosis not present

## 2023-02-07 LAB — COMPREHENSIVE METABOLIC PANEL
ALT: 14 U/L (ref 0–53)
AST: 17 U/L (ref 0–37)
Albumin: 4.4 g/dL (ref 3.5–5.2)
Alkaline Phosphatase: 58 U/L (ref 39–117)
BUN: 23 mg/dL (ref 6–23)
CO2: 28 meq/L (ref 19–32)
Calcium: 9.9 mg/dL (ref 8.4–10.5)
Chloride: 98 meq/L (ref 96–112)
Creatinine, Ser: 1.11 mg/dL (ref 0.40–1.50)
GFR: 62.62 mL/min (ref >60.00)
Glucose, Bld: 105 mg/dL — ABNORMAL HIGH (ref 70–99)
Potassium: 4.8 meq/L (ref 3.5–5.1)
Sodium: 134 meq/L — ABNORMAL LOW (ref 135–145)
Total Bilirubin: 1 mg/dL (ref 0.2–1.2)
Total Protein: 6.8 g/dL (ref 6.0–8.3)

## 2023-02-07 LAB — CBC
HCT: 41.4 % (ref 39.0–52.0)
Hemoglobin: 13.3 g/dL (ref 13.0–17.0)
MCHC: 32.2 g/dL (ref 30.0–36.0)
MCV: 96.6 fL (ref 78.0–100.0)
Platelets: 206 10*3/uL (ref 150.0–400.0)
RBC: 4.29 Mil/uL (ref 4.22–5.81)
RDW: 13.2 % (ref 11.5–15.5)
WBC: 4.5 10*3/uL (ref 4.0–10.5)

## 2023-02-07 LAB — TSH: TSH: 3.96 u[IU]/mL (ref 0.35–5.50)

## 2023-02-07 LAB — BRAIN NATRIURETIC PEPTIDE: Pro B Natriuretic peptide (BNP): 260 pg/mL — ABNORMAL HIGH (ref 0.0–100.0)

## 2023-02-07 LAB — VITAMIN B12: Vitamin B-12: 358 pg/mL (ref 211–911)

## 2023-02-07 MED ORDER — PROAIR HFA 108 (90 BASE) MCG/ACT IN AERS: 1.0000 | INHALATION_SPRAY | Freq: Four times a day (QID) | RESPIRATORY_TRACT | 0 refills | Status: DC | PRN

## 2023-02-07 NOTE — Progress Notes (Signed)
Acute Office Visit  Subjective:     Patient ID: [name], male    DOB: 10-08-1940, 82 y.o.   MRN: [id number]  Chief Complaint  Patient presents with   Leg Swelling   medicaton refill    Both inhalers.     HPI Patient is in today for leg swelling with a history of Afib, CHF, Aortic insufficiency, COPD, hypothyroidism    Last echo was a TEE and showed EF of 50%, grade 2 dystolic dysfunction, prosthetic valve  Leg swelling: states that he has the history of the same that has happened over the past year. State that he limits the salt. States that he uses compression socks in the past. States that the legs will go down with elevation  He is followed by Dr Sharyn Lull. States that he is on eplernone 12.5mg  daily and approx 2 days a week he will take a whole pill   He also describes a leg weakness/altered sensation.  Patient has no trouble walking per his report has no proprioception limitations.  States that he is going down the steps sometimes they feel "weak".  States they just do not "feel right" Review of Systems  Constitutional:  Negative for chills and fever.  Respiratory:  Negative for shortness of breath.   Cardiovascular:  Negative for chest pain.  Genitourinary:  Negative for dysuria and frequency.  Neurological:  Negative for tingling, weakness and headaches.        Objective:    BP 124/80   Pulse (!) 50   Temp 97.7 F (36.5 C) (Oral)   Ht 6\' 1"  (1.854 m)   Wt 178 lb 9.6 oz (81 kg)   SpO2 97%   BMI 23.56 kg/m    Physical Exam Vitals and nursing note reviewed.  Constitutional:      Appearance: Normal appearance.  Cardiovascular:     Rate and Rhythm: Regular rhythm. Bradycardia present.     Pulses:          Posterior tibial pulses are 1+ on the right side and 1+ on the left side.     Heart sounds: Normal heart sounds.  Pulmonary:     Effort: Pulmonary effort is normal.     Breath sounds: Normal breath sounds.  Musculoskeletal:     Lumbar back: No  tenderness or bony tenderness. Negative right straight leg raise test and negative left straight leg raise test.     Right lower leg: Edema (trace) present.     Left lower leg: Edema (trace) present.  Neurological:     General: No focal deficit present.     Mental Status: He is alert.     Deep Tendon Reflexes:     Reflex Scores:      Patellar reflexes are 2+ on the right side and 2+ on the left side.    Comments: Bilateral lower extremity 5/5     No results found for any visits on 02/07/23.      Assessment & Plan:   Problem List Items Addressed This Visit       Other   Lower extremity edema - Primary    Likely dependent.  Patient will use compression garments along with elevating legs.  He is on a diuretic that he takes as prescribed.  Will check basic labs inclusive of BNP as patient does have a history of heart failure with an EF of 50% and grade 2 diastolic dysfunction      Relevant Orders   CBC  Comprehensive metabolic panel   TSH   Brain natriuretic peptide   Weakness of both lower extremities    Exam benign in office.  Patient has appropriate strength on bilateral lower extremities 1+ posterior tibial pulse.  Pending labs consider referral to neurology for further workup if needed      Relevant Orders   TSH   Decreased sensation    Pending labs inclusive of B12 and TSH.      Relevant Orders   Vitamin B12    Meds ordered this encounter  Medications   PROAIR HFA 108 (90 Base) MCG/ACT inhaler    Sig: Inhale 1-2 puffs into the lungs every 6 (six) hours as needed for wheezing or shortness of breath.    Dispense:  18 g    Refill:  0    Order Specific Question:   Supervising Provider    Answer:   Roxy Manns A [[id number]]    Return in about 6 months (around 08/08/2023) for CPE and Labs.  Audria Nine, NP

## 2023-02-07 NOTE — Assessment & Plan Note (Signed)
Likely dependent.  Patient will use compression garments along with elevating legs.  He is on a diuretic that he takes as prescribed.  Will check basic labs inclusive of BNP as patient does have a history of heart failure with an EF of 50% and grade 2 diastolic dysfunction

## 2023-02-07 NOTE — Assessment & Plan Note (Signed)
Exam benign in office.  Patient has appropriate strength on bilateral lower extremities 1+ posterior tibial pulse.  Pending labs consider referral to neurology for further workup if needed

## 2023-02-07 NOTE — Patient Instructions (Signed)
Nice to see you today I will be in touch with the labs once I have reviewed them  See me in about 6 months for your physical and labs

## 2023-02-07 NOTE — Assessment & Plan Note (Signed)
Pending labs inclusive of B12 and TSH.

## 2023-02-08 ENCOUNTER — Telehealth: Payer: Self-pay | Admitting: Nurse Practitioner

## 2023-02-08 NOTE — Telephone Encounter (Signed)
Pt called to let Cable know he started wearing decent shoes & loosening up the belt on his pants & now he has regained feeling in his leg. Call back # [phone]

## 2023-02-09 NOTE — Telephone Encounter (Signed)
noted 

## 2023-03-02 DIAGNOSIS — Z0100 Encounter for examination of eyes and vision without abnormal findings: Secondary | ICD-10-CM | POA: Diagnosis not present

## 2023-03-02 DIAGNOSIS — Z961 Presence of intraocular lens: Secondary | ICD-10-CM | POA: Diagnosis not present

## 2023-03-02 DIAGNOSIS — H35373 Puckering of macula, bilateral: Secondary | ICD-10-CM | POA: Diagnosis not present

## 2023-03-02 DIAGNOSIS — H43813 Vitreous degeneration, bilateral: Secondary | ICD-10-CM | POA: Diagnosis not present

## 2023-03-02 DIAGNOSIS — H35372 Puckering of macula, left eye: Secondary | ICD-10-CM | POA: Diagnosis not present

## 2023-03-02 DIAGNOSIS — H35371 Puckering of macula, right eye: Secondary | ICD-10-CM | POA: Diagnosis not present

## 2023-05-09 DIAGNOSIS — E782 Mixed hyperlipidemia: Secondary | ICD-10-CM | POA: Diagnosis not present

## 2023-05-09 DIAGNOSIS — I10 Essential (primary) hypertension: Secondary | ICD-10-CM | POA: Diagnosis not present

## 2023-05-09 DIAGNOSIS — E039 Hypothyroidism, unspecified: Secondary | ICD-10-CM | POA: Diagnosis not present

## 2023-05-09 DIAGNOSIS — I5022 Chronic systolic (congestive) heart failure: Secondary | ICD-10-CM | POA: Diagnosis not present

## 2023-06-25 ENCOUNTER — Ambulatory Visit: Payer: Medicare PPO | Admitting: Internal Medicine

## 2023-06-25 ENCOUNTER — Encounter: Payer: Self-pay | Admitting: Internal Medicine

## 2023-06-25 VITALS — BP 110/70 | HR 71 | Temp 98.0°F | Ht 73.0 in | Wt 184.0 lb

## 2023-06-25 DIAGNOSIS — J0140 Acute pansinusitis, unspecified: Secondary | ICD-10-CM | POA: Diagnosis not present

## 2023-06-25 MED ORDER — DOXYCYCLINE HYCLATE 100 MG PO TABS: 100.0000 mg | ORAL_TABLET | Freq: Two times a day (BID) | ORAL | 0 refills | Status: DC

## 2023-06-25 NOTE — Progress Notes (Signed)
 Subjective:    Patient ID: [name], male    DOB: 1940-06-17, 83 y.o.   MRN: [id number]  HPI Here due to respiratory infection  Having a lot of congestion Lots of sinus trouble all his life Started again about a week ago--stopped up, phlegm No fever Lots of post nasal drip Coughing up stuff Not really SOB  Not taking anything  Current Outpatient Medications on File Prior to Visit  Medication Sig Dispense Refill   amiodarone (PACERONE) 200 MG tablet Take 1 tablet (200 mg total) by mouth daily. 30 tablet 3   aspirin 81 MG chewable tablet Chew 1 tablet (81 mg total) by mouth daily.     atorvastatin (LIPITOR) 20 MG tablet Take 10 mg by mouth daily at 6 PM.     carvedilol (COREG) 6.25 MG tablet Take 1 tablet (6.25 mg total) by mouth 2 (two) times daily with a meal. 60 tablet 6   eplerenone (INSPRA) 25 MG tablet Take 12.5 mg by mouth daily.     levothyroxine (SYNTHROID) 50 MCG tablet Take 75 mcg by mouth daily. TAKES VARYING DOSES DAILY PER PT     lisinopril (PRINIVIL,ZESTRIL) 5 MG tablet Take 0.5 tablets (2.5 mg total) by mouth 2 (two) times daily. 30 tablet 3   tiotropium (SPIRIVA HANDIHALER) 18 MCG inhalation capsule Place into inhaler and inhale.     No current facility-administered medications on file prior to visit.    No Known Allergies  Past Medical History:  Diagnosis Date   A-fib (HCC)    Acute systolic heart failure (HCC) 07/10/2014   Anxiety    Xanax PRN   Arthritis    Cataracts, bilateral    CHF (congestive heart failure) (HCC)    CHF exacerbation (HCC) 07/10/2014   COPD (chronic obstructive pulmonary disease) (HCC)    History of bronchitis    History of stomach ulcers    Hyperlipidemia    Hypertension    Hypothyroidism    Hypothyroidism 04/03/2019   Seasonal allergies    Wears dentures    full upper, lower sub-periostal    Past Surgical History:  Procedure Laterality Date   BENTALL PROCEDURE N/A 03/17/2015   Procedure: BIOLOGIC BENTALL PROCEDURE  WITH 27 TISSUE VALVE AND 30 VALSALVA GRAFT;  Surgeon: Delight Ovens, MD;  Location: [facility] OR;  Service: Open Heart Surgery;  Laterality: N/A;   CARDIAC CATHETERIZATION     CARDIOVERSION N/A 07/13/2014   Procedure: CARDIOVERSION;  Surgeon: Orpah Cobb, MD;  Location: [facility] ENDOSCOPY;  Service: Cardiovascular;  Laterality: N/A;   CARDIOVERSION N/A 10/01/2014   Procedure: CARDIOVERSION;  Surgeon: Laurey Morale, MD;  Location: [facility] ENDOSCOPY;  Service: Cardiovascular;  Laterality: N/A;   CATARACT EXTRACTION W/PHACO Right 10/04/2016   Procedure: CATARACT EXTRACTION PHACO AND INTRAOCULAR LENS PLACEMENT (IOC)  right;  Surgeon: Lockie Mola, MD;  Location: [facility] SURGERY CNTR;  Service: Ophthalmology;  Laterality: Right;   CATARACT EXTRACTION W/PHACO Left 10/30/2016   Procedure: CATARACT EXTRACTION PHACO AND INTRAOCULAR LENS PLACEMENT (IOC)  Left;  Surgeon: Lockie Mola, MD;  Location: [facility]ERY [facility];  Service: Ophthalmology;  Laterality: Left;  Requests early   CHOLECYSTECTOMY     CLIPPING OF ATRIAL APPENDAGE N/A 03/17/2015   Procedure: CLIPPING OF LEFT ATRIAL APPENDAGE;  Surgeon: Delight Ovens, MD;  Location[facility] OR;  Service: Open Heart Surgery;  Laterality: N/A;   COLONOSCOPY     ESOPHAGOGASTRODUODENOSCOPY     HERNIA REPAIR     JOINT REPLACEMENT     bilateral  knee, 1/09, 6/09   LEFT AND RIGHT HEART CATHETERIZATION WITH CORONARY ANGIOGRAM N/A 07/14/2014   Procedure: LEFT AND RIGHT HEART CATHETERIZATION WITH CORONARY ANGIOGRAM;  Surgeon: Rinaldo Cloud, MD;  Location: [facility] C[facility] LAB;  Service: Cardiovascular;  Laterality: N/A;   MAZE N/A 03/17/2015   Procedure: LEFT BIPOLAR MAZE;  Surgeon: Delight Ovens, MD;  Location: [facility] OR;  Service: Open Heart Surgery;  Laterality: N/A;   TEE WITHOUT CARDIOVERSION N/A 07/13/2014   Procedure: TRANSESOPHAGEAL ECHOCARDIOGRAM (TEE) WITH CARDIOVERSION;  Surgeon: Orpah Cobb, MD;  Location: [facility] ENDOSCOPY;  Service: Cardiovascular;  Laterality: N/A;   TEE  WITHOUT CARDIOVERSION N/A 03/17/2015   Procedure: TRANSESOPHAGEAL ECHOCARDIOGRAM (TEE);  Surgeon: Delight Ovens, MD;  Location: [facility] OR;  Service: Open Heart Surgery;  Laterality: N/A;    Family History  Problem Relation Age of Onset   Stroke Mother 98   Heart attack Father 44   Cancer Paternal Grandfather     Social History   Socioeconomic History   Marital status: Widowed    Spouse name: Maralyn Sago - deceased    Number of children: 0   Years of education: some college   Highest education level: Not on file  Occupational History   Not on file  Tobacco Use   Smoking status: Former    Types: Cigarettes   Smokeless tobacco: Never   Tobacco comments:    quit at 21  Substance and Sexual Activity   Alcohol use: Never    Alcohol/week: 0.0 standard drinks of alcohol   Drug use: Never   Sexual activity: Not Currently  Other Topics Concern   Not on file  Social History Narrative   04/03/19   From: the area   Living: [place], alone   Work: former Musician, retired Programmer, multimedia, active in the [employer]   Widower: Maralyn Sago - car accident 2018      Family: Doreene Adas (from wife) - [place], cousin who is like a sister      Enjoys: [employer], exercise      Exercise: ride bike daily, walking 5 mile a day, lifting weights   Diet: healthy - avoiding salt but tired of his diet      Safety   Seat belts: Yes    Guns: Yes  and secure   Safe in relationships: Yes    Social Drivers of Corporate investment banker Strain: Low Risk  (04/03/2019)   Overall Financial Resource Strain (CARDIA)    Difficulty of Paying Living Expenses: Not hard at all  Food Insecurity: Not on file  Transportation Needs: Not on file  Physical Activity: Not on file  Stress: Not on file  Social Connections: Not on file  Intimate Partner Violence: Not on file   Review of Systems No N/V Eating okay     Objective:   Physical Exam Constitutional:      Appearance: Normal appearance.   HENT:     Head:     Comments: No sinus tenderness    Right Ear: Tympanic membrane and ear canal normal.     Left Ear: Tympanic membrane and ear canal normal.     Mouth/Throat:     Pharynx: No oropharyngeal exudate or posterior oropharyngeal erythema.  Pulmonary:     Effort: Pulmonary effort is normal.     Breath sounds: No wheezing or rales.     Comments: Decreased breath sounds but clear Musculoskeletal:     Cervical back: Neck supple.  Lymphadenopathy:     Cervical: No  cervical adenopathy.  Neurological:     Mental Status: He is alert.            Assessment & Plan:

## 2023-06-25 NOTE — Assessment & Plan Note (Signed)
 A week of symptoms---with chronic sinus problems (though not lately) Discussed tylenol Will give doxycycline 100 bid x 7 days No clear exacerbation of COPD

## 2023-08-08 ENCOUNTER — Encounter: Payer: Self-pay | Admitting: Nurse Practitioner

## 2023-08-08 ENCOUNTER — Ambulatory Visit (INDEPENDENT_AMBULATORY_CARE_PROVIDER_SITE_OTHER): Payer: Medicare PPO | Admitting: Nurse Practitioner

## 2023-08-08 VITALS — BP 122/62 | HR 52 | Temp 98.2°F | Ht 73.0 in | Wt 181.2 lb

## 2023-08-08 DIAGNOSIS — Z125 Encounter for screening for malignant neoplasm of prostate: Secondary | ICD-10-CM | POA: Diagnosis not present

## 2023-08-08 DIAGNOSIS — E782 Mixed hyperlipidemia: Secondary | ICD-10-CM | POA: Diagnosis not present

## 2023-08-08 DIAGNOSIS — J449 Chronic obstructive pulmonary disease, unspecified: Secondary | ICD-10-CM

## 2023-08-08 DIAGNOSIS — I700 Atherosclerosis of aorta: Secondary | ICD-10-CM | POA: Diagnosis not present

## 2023-08-08 DIAGNOSIS — R29898 Other symptoms and signs involving the musculoskeletal system: Secondary | ICD-10-CM | POA: Diagnosis not present

## 2023-08-08 DIAGNOSIS — R202 Paresthesia of skin: Secondary | ICD-10-CM | POA: Diagnosis not present

## 2023-08-08 DIAGNOSIS — I4819 Other persistent atrial fibrillation: Secondary | ICD-10-CM

## 2023-08-08 DIAGNOSIS — I5022 Chronic systolic (congestive) heart failure: Secondary | ICD-10-CM | POA: Diagnosis not present

## 2023-08-08 DIAGNOSIS — E039 Hypothyroidism, unspecified: Secondary | ICD-10-CM | POA: Diagnosis not present

## 2023-08-08 DIAGNOSIS — Z0000 Encounter for general adult medical examination without abnormal findings: Secondary | ICD-10-CM | POA: Diagnosis not present

## 2023-08-08 DIAGNOSIS — I10 Essential (primary) hypertension: Secondary | ICD-10-CM | POA: Diagnosis not present

## 2023-08-08 LAB — CBC
HCT: 41 % (ref 39.0–52.0)
Hemoglobin: 13.8 g/dL (ref 13.0–17.0)
MCHC: 33.7 g/dL (ref 30.0–36.0)
MCV: 95 fl (ref 78.0–100.0)
Platelets: 198 10*3/uL (ref 150.0–400.0)
RBC: 4.32 Mil/uL (ref 4.22–5.81)
RDW: 13.5 % (ref 11.5–15.5)
WBC: 4.2 10*3/uL (ref 4.0–10.5)

## 2023-08-08 LAB — LIPID PANEL
Cholesterol: 141 mg/dL (ref 0–200)
HDL: 49.5 mg/dL (ref >39.00)
LDL Cholesterol: 73 mg/dL (ref 0–99)
NonHDL: 91.04
Total CHOL/HDL Ratio: 3
Triglycerides: 89 mg/dL (ref 0.0–149.0)
VLDL: 17.8 mg/dL (ref 0.0–40.0)

## 2023-08-08 LAB — COMPREHENSIVE METABOLIC PANEL WITH GFR
ALT: 16 U/L (ref 0–53)
AST: 19 U/L (ref 0–37)
Albumin: 4.7 g/dL (ref 3.5–5.2)
Alkaline Phosphatase: 64 U/L (ref 39–117)
BUN: 18 mg/dL (ref 6–23)
CO2: 28 meq/L (ref 19–32)
Calcium: 9.5 mg/dL (ref 8.4–10.5)
Chloride: 100 meq/L (ref 96–112)
Creatinine, Ser: 1.17 mg/dL (ref 0.40–1.50)
GFR: 58.58 mL/min — ABNORMAL LOW (ref >60.00)
Glucose, Bld: 108 mg/dL — ABNORMAL HIGH (ref 70–99)
Potassium: 4.4 meq/L (ref 3.5–5.1)
Sodium: 135 meq/L (ref 135–145)
Total Bilirubin: 0.9 mg/dL (ref 0.2–1.2)
Total Protein: 7.5 g/dL (ref 6.0–8.3)

## 2023-08-08 LAB — VITAMIN B12: Vitamin B-12: 361 pg/mL (ref 211–911)

## 2023-08-08 LAB — PSA, MEDICARE: PSA: 0.9 ng/mL (ref 0.10–4.00)

## 2023-08-08 LAB — TSH: TSH: 4.86 u[IU]/mL (ref 0.35–5.50)

## 2023-08-08 NOTE — Patient Instructions (Signed)
 Nice to see you today I will be in touch with the labs and ultrasounds once I have the results Follow up with me in 1 year, sooner if you need me

## 2023-08-08 NOTE — Assessment & Plan Note (Signed)
 Pending labs and ABI ultrasounds.

## 2023-08-08 NOTE — Assessment & Plan Note (Signed)
 Discussed age-appropriate immunizations and screening exams.  Did review patient's personal, surgical, social, family histories.  Patient is up-to-date on all age-appropriate vaccinations he would like.  Patient is aged out of CRC screening.  PSA for prostate cancer screening today.  Patient was given information at discharge about preventative healthcare maintenance with anticipatory guidance

## 2023-08-08 NOTE — Assessment & Plan Note (Signed)
 Patient currently followed by cardiology he is on amiodarone, carvedilol, eplerenone, and lisinopril

## 2023-08-08 NOTE — Assessment & Plan Note (Signed)
 Have been discussed before ambiguous in nature.  Will do ABIs as it seems to be more with exertion and movement.  He also has paresthesias to the bilateral lower toes

## 2023-08-08 NOTE — Assessment & Plan Note (Signed)
 History of the same.  Patient maintained on Spiriva but uses it on a as needed basis.  Denies shortness of breath continue medication as prescribed

## 2023-08-08 NOTE — Progress Notes (Signed)
 Established Patient Office Visit  Subjective   Patient ID: [name], male    DOB: 11/16/1940  Age: 83 y.o. MRN: [id number]  Chief Complaint  Patient presents with   Annual Exam    HPI  CHF/Afib: patient is currently on amiodarone, carvedilol, eplerenone, and lisinopril. Managed by Dr. Sharyn Lull.   COPD: currently maintained on spiriva. States that he does not take it and does not get short of breath. He used it twice a week    Hypothyroidism: on levothyroxine daily.   for complete physical and follow up of chronic conditions.  Immunizations: -Tetanus: Completed in unsure  -Influenza: UTD at the pharmacy.  -Shingles: Completed Shingrix series -Pneumonia: Completed   Diet: Fair diet. He is eating 2-3 meals a day. Some sncaks. He is drinking soft drinks, milks and water  Exercise:  Lift weights every day  Eye exam: Completes annually. Has cataract surgery in the past   Dental exam: Completes semi-annually    Colonoscopy: Completed in 2011. Aged out Lung Cancer Screening: NA  PSA: Due  Sleep: Patient gets 6 to 7 hours of sleep at night    Review of Systems  Constitutional:  Negative for chills and fever.  Respiratory:  Positive for shortness of breath.   Cardiovascular:  Negative for chest pain and leg swelling.  Gastrointestinal:  Negative for abdominal pain, blood in stool, constipation, diarrhea, nausea and vomiting.       BM every 3 days   Genitourinary:  Negative for dysuria and hematuria.  Neurological:  Negative for tingling and headaches.  Psychiatric/Behavioral:  Negative for hallucinations and suicidal ideas.       Objective:     BP 122/62   Pulse (!) 52   Temp 98.2 F (36.8 C) (Oral)   Ht 6\' 1"  (1.854 m)   Wt 181 lb 3.2 oz (82.2 kg)   SpO2 93%   BMI 23.91 kg/m  BP Readings from Last 3 Encounters:  08/08/23 122/62  06/25/23 110/70  02/07/23 124/80   Wt Readings from Last 3 Encounters:  08/08/23 181 lb 3.2 oz (82.2 kg)   06/25/23 184 lb (83.5 kg)  02/07/23 178 lb 9.6 oz (81 kg)   SpO2 Readings from Last 3 Encounters:  08/08/23 93%  06/25/23 94%  02/07/23 97%      Physical Exam Vitals and nursing note reviewed.  Constitutional:      Appearance: Normal appearance.  HENT:     Right Ear: Tympanic membrane, ear canal and external ear normal.     Left Ear: Tympanic membrane, ear canal and external ear normal.     Mouth/Throat:     Mouth: Mucous membranes are moist.     Pharynx: Oropharynx is clear.  Eyes:     Extraocular Movements: Extraocular movements intact.     Pupils: Pupils are equal, round, and reactive to light.  Cardiovascular:     Rate and Rhythm: Normal rate. Rhythm irregular.     Pulses: Normal pulses.     Heart sounds: Normal heart sounds.  Pulmonary:     Effort: Pulmonary effort is normal.     Breath sounds: Normal breath sounds.  Abdominal:     General: Bowel sounds are normal. There is no distension.     Palpations: There is no mass.     Tenderness: There is no abdominal tenderness.     Hernia: No hernia is present.  Musculoskeletal:     Right lower leg: No edema.     Left  lower leg: No edema.  Lymphadenopathy:     Cervical: No cervical adenopathy.  Skin:    General: Skin is warm.  Neurological:     General: No focal deficit present.     Mental Status: He is alert.     Deep Tendon Reflexes:     Reflex Scores:      Bicep reflexes are 2+ on the right side and 2+ on the left side.      Patellar reflexes are 2+ on the right side and 2+ on the left side.    Comments: Bilateral upper and lower extremity strength 5/5  Psychiatric:        Mood and Affect: Mood normal.        Behavior: Behavior normal.        Thought Content: Thought content normal.        Judgment: Judgment normal.      Results for orders placed or performed in visit on 08/08/23  CBC  Result Value Ref Range   WBC 4.2 4.0 - 10.5 K/uL   RBC 4.32 4.22 - 5.81 Mil/uL   Platelets 198.0 150.0 - 400.0 K/uL    Hemoglobin 13.8 13.0 - 17.0 g/dL   HCT 16.1 09.6 - 04.5 %   MCV 95.0 78.0 - 100.0 fl   MCHC 33.7 30.0 - 36.0 g/dL   RDW 40.9 81.1 - 91.4 %  Comprehensive metabolic panel with GFR  Result Value Ref Range   Sodium 135 135 - 145 mEq/L   Potassium 4.4 3.5 - 5.1 mEq/L   Chloride 100 96 - 112 mEq/L   CO2 28 19 - 32 mEq/L   Glucose, Bld 108 (H) 70 - 99 mg/dL   BUN 18 6 - 23 mg/dL   Creatinine, Ser 7.82 0.40 - 1.50 mg/dL   Total Bilirubin 0.9 0.2 - 1.2 mg/dL   Alkaline Phosphatase 64 39 - 117 U/L   AST 19 0 - 37 U/L   ALT 16 0 - 53 U/L   Total Protein 7.5 6.0 - 8.3 g/dL   Albumin 4.7 3.5 - 5.2 g/dL   GFR 95.62 (L) >13.08 mL/min   Calcium 9.5 8.4 - 10.5 mg/dL  Lipid panel  Result Value Ref Range   Cholesterol 141 0 - 200 mg/dL   Triglycerides 65.7 0.0 - 149.0 mg/dL   HDL 84.69 >62.95 mg/dL   VLDL 28.4 0.0 - 13.2 mg/dL   LDL Cholesterol 73 0 - 99 mg/dL   Total CHOL/HDL Ratio 3    NonHDL 91.04   PSA, Medicare  Result Value Ref Range   PSA 0.90 0.10 - 4.00 ng/ml  TSH  Result Value Ref Range   TSH 4.86 0.35 - 5.50 uIU/mL  Vitamin B12  Result Value Ref Range   Vitamin B-12 361 211 - 911 pg/mL      The ASCVD Risk score (Arnett DK, et al., 2019) failed to calculate for the following reasons:   The 2019 ASCVD risk score is only valid for ages 42 to 59    Assessment & Plan:   Problem List Items Addressed This Visit       Cardiovascular and Mediastinum   Persistent atrial fibrillation [facility])   Patient currently followed by cardiology he is on amiodarone, carvedilol, eplerenone, and lisinopril      Relevant Orders   CBC (Completed)   Comprehensive metabolic panel with GFR (Completed)   TSH (Completed)   Aortic atherosclerosis (HCC)   Patient currently maintained on atorvastatin 20 mg daily.  Pending lipid panel      Relevant Orders   Lipid panel (Completed)   US ARTERIAL ABI (SCREENING LOWER EXTREMITY)     Respiratory   COPD (chronic obstructive pulmonary disease)  (HCC)   History of the same.  Patient maintained on Spiriva but uses it on a as needed basis.  Denies shortness of breath continue medication as prescribed        Other   Paresthesia   Pending labs and ABI ultrasounds.      Relevant Orders   CBC (Completed)   Comprehensive metabolic panel with GFR (Completed)   TSH (Completed)   Vitamin B12 (Completed)   Preventative health care - Primary   Discussed age-appropriate immunizations and screening exams.  Did review patient's personal, surgical, social, family histories.  Patient is up-to-date on all age-appropriate vaccinations he would like.  Patient is aged out of CRC screening.  PSA for prostate cancer screening today.  Patient was given information at discharge about preventative healthcare maintenance with anticipatory guidance      Weakness of both lower extremities   Have been discussed before ambiguous in nature.  Will do ABIs as it seems to be more with exertion and movement.  He also has paresthesias to the bilateral lower toes      Relevant Orders   CBC (Completed)   Comprehensive metabolic panel with GFR (Completed)   US ARTERIAL ABI (SCREENING LOWER EXTREMITY)   Other Visit Diagnoses       Screening for prostate cancer       Relevant Orders   PSA, Medicare (Completed)       Return in about 1 year (around 08/07/2024).    Audria Nine, NP

## 2023-08-08 NOTE — Assessment & Plan Note (Signed)
 Patient currently maintained on atorvastatin 20 mg daily.  Pending lipid panel

## 2023-10-10 ENCOUNTER — Ambulatory Visit: Admitting: Nurse Practitioner

## 2023-10-10 VITALS — BP 100/60 | HR 68 | Temp 97.6°F | Ht 73.0 in | Wt 178.6 lb

## 2023-10-10 DIAGNOSIS — I5022 Chronic systolic (congestive) heart failure: Secondary | ICD-10-CM

## 2023-10-10 DIAGNOSIS — R600 Localized edema: Secondary | ICD-10-CM

## 2023-10-10 NOTE — Progress Notes (Signed)
 Acute Office Visit  Subjective:     Patient ID: [name], male    DOB: 04-Oct-1940, 83 y.o.   MRN: [id number]  Chief Complaint  Patient presents with   Leg Swelling    Pt complains of swelling on both legs that started about a week or two. Pt has no pain just uncomfortable tightness.     HPI Patient is in today for leg swelling with a history of lower extremity edema, CHF, a fib, aortic insufficiency, Emphysema, COPD.  Patient states that he has been swelling for an unsure length of time but he thinsk 4-5 days. States that he has been eating more salt as of late. He has been having some pizza. States that it is about the same all the time. States that it is not worse in the orning or in the evening.   He is walking a mile a day and is lifting weights. He state that he is no more short of breath than normal. He is able to do all his activities.      Review of Systems  Constitutional:  Negative for chills and fever.  Respiratory:  Positive for shortness of breath.   Cardiovascular:  Positive for leg swelling. Negative for chest pain.  Neurological:  Negative for headaches.  Psychiatric/Behavioral:  Negative for hallucinations and suicidal ideas.         Objective:    BP 100/60   Pulse 68   Temp 97.6 F (36.4 C) (Oral)   Ht 6' 1 (1.854 m)   Wt 178 lb 9.6 oz (81 kg)   SpO2 97%   BMI 23.56 kg/m    Physical Exam Vitals and nursing note reviewed.  Constitutional:      Appearance: Normal appearance.  Cardiovascular:     Rate and Rhythm: Normal rate and regular rhythm.     Pulses:          Dorsalis pedis pulses are 2+ on the right side and 2+ on the left side.       Posterior tibial pulses are 1+ on the right side and 1+ on the left side.     Heart sounds: Normal heart sounds.  Pulmonary:     Effort: Pulmonary effort is normal.     Breath sounds: Normal breath sounds.  Musculoskeletal:     Right lower leg: Edema present.     Left lower leg: Edema (L>R)  present.     Comments: Calves non tender and supple   Spider vein on bilateral lower extremities   Neurological:     Mental Status: He is alert.     No results found for any visits on 10/10/23.      Assessment & Plan:   Problem List Items Addressed This Visit       Cardiovascular and Mediastinum   Chronic systolic CHF (congestive heart failure) (HCC)   History of the same.  Patient currently maintained on eplerenone, Carvedilol , lisinopril .  Checking BMP today pending result      Relevant Orders   CBC   Basic metabolic panel with GFR   Brain natriuretic peptide     Other   Lower extremity edema - Primary   History of the same do favor dependent edema.  Patient does have a history of atrial fibrillation but within normal rhythm today.  Calfs are soft and subtle low likelihood for DVT.  Do favor dependent edema.  Will check basic labs inclusive of renal function and BNP.  Patient was  told to elevate legs at rest and to avoid high sodium foods which has been eating quite a bit of as of late.  Goal of less than 2500 mg of sodium a day hopefully less than 2000 mg daily      Relevant Orders   CBC   Basic metabolic panel with GFR   Brain natriuretic peptide    No orders of the defined types were placed in this encounter.   Return if symptoms worsen or fail to improve.  Margarie Shay, NP

## 2023-10-10 NOTE — Assessment & Plan Note (Signed)
 History of the same.  Patient currently maintained on eplerenone, Carvedilol , lisinopril .  Checking BMP today pending result

## 2023-10-10 NOTE — Assessment & Plan Note (Signed)
 History of the same do favor dependent edema.  Patient does have a history of atrial fibrillation but within normal rhythm today.  Calfs are soft and subtle low likelihood for DVT.  Do favor dependent edema.  Will check basic labs inclusive of renal function and BNP.  Patient was told to elevate legs at rest and to avoid high sodium foods which has been eating quite a bit of as of late.  Goal of less than 2500 mg of sodium a day hopefully less than 2000 mg daily

## 2023-10-10 NOTE — Patient Instructions (Signed)
 Nice to see you today I will be in touch with the labs once I have them Follow up with me as needed  Try to limit your salt intake Elevate your legs when you are sitting at home

## 2023-10-11 LAB — CBC
HCT: 40.8 % (ref 38.5–50.0)
Hemoglobin: 13.2 g/dL (ref 13.2–17.1)
MCH: 31.1 pg (ref 27.0–33.0)
MCHC: 32.4 g/dL (ref 32.0–36.0)
MCV: 96.2 fL (ref 80.0–100.0)
MPV: 10.8 fL (ref 7.5–12.5)
Platelets: 186 10*3/uL (ref 140–400)
RBC: 4.24 10*6/uL (ref 4.20–5.80)
RDW: 12 % (ref 11.0–15.0)
WBC: 4.5 10*3/uL (ref 3.8–10.8)

## 2023-10-11 LAB — BASIC METABOLIC PANEL WITH GFR
BUN/Creatinine Ratio: 16 (calc) (ref 6–22)
BUN: 20 mg/dL (ref 7–25)
CO2: 26 mmol/L (ref 20–32)
Calcium: 9.5 mg/dL (ref 8.6–10.3)
Chloride: 102 mmol/L (ref 98–110)
Creat: 1.23 mg/dL — ABNORMAL HIGH (ref 0.70–1.22)
Glucose, Bld: 102 mg/dL — ABNORMAL HIGH (ref 65–99)
Potassium: 4.8 mmol/L (ref 3.5–5.3)
Sodium: 137 mmol/L (ref 135–146)
eGFR: 59 mL/min/{1.73_m2} — ABNORMAL LOW (ref >=60)

## 2023-10-11 LAB — BRAIN NATRIURETIC PEPTIDE: Brain Natriuretic Peptide: 163 pg/mL — ABNORMAL HIGH (ref <100)

## 2023-10-12 ENCOUNTER — Ambulatory Visit: Payer: Self-pay | Admitting: Nurse Practitioner

## 2023-10-12 NOTE — Telephone Encounter (Signed)
 Mailed letter to pt' address on file/.

## 2023-10-19 ENCOUNTER — Telehealth: Payer: Self-pay | Admitting: Nurse Practitioner

## 2023-10-19 NOTE — Telephone Encounter (Signed)
 Copied from CRM [id number]. Topic: General - Call Back - No Documentation >> Oct 19, 2023 12:43 PM Leah C wrote: Reason for CRM: Patient called in and would like NP Margarie Shay to know that his swelling has gone down with elevating his legs and he is following the orders. And, if they need him or anything, to please give him a phone call.   [phone] Patient's contact.

## 2023-10-22 NOTE — Telephone Encounter (Signed)
 Noted. Glad there has been improvement

## 2023-11-07 DIAGNOSIS — E039 Hypothyroidism, unspecified: Secondary | ICD-10-CM | POA: Diagnosis not present

## 2023-11-07 DIAGNOSIS — E782 Mixed hyperlipidemia: Secondary | ICD-10-CM | POA: Diagnosis not present

## 2023-11-07 DIAGNOSIS — I5022 Chronic systolic (congestive) heart failure: Secondary | ICD-10-CM | POA: Diagnosis not present

## 2023-11-07 DIAGNOSIS — I10 Essential (primary) hypertension: Secondary | ICD-10-CM | POA: Diagnosis not present

## 2023-11-15 ENCOUNTER — Telehealth: Payer: Self-pay

## 2023-11-15 NOTE — Telephone Encounter (Signed)
 Contacted pt and let him know that lab results have been e-faxed.  Called cardiologist and was advised to send labs manually.  Fax sent with confirmation log.

## 2023-11-15 NOTE — Telephone Encounter (Signed)
 Copied from CRM [id number]. Topic: General - Other >> Nov 15, 2023  2:38 PM [name] DEL wrote: Reason for CRM: Patient would like to know if CMA has faxed lab results over to his cardiologist? He was advised by CMA that she would have the lab results faxed over to them.

## 2024-02-06 DIAGNOSIS — I10 Essential (primary) hypertension: Secondary | ICD-10-CM | POA: Diagnosis not present

## 2024-02-06 DIAGNOSIS — E782 Mixed hyperlipidemia: Secondary | ICD-10-CM | POA: Diagnosis not present

## 2024-02-06 DIAGNOSIS — E039 Hypothyroidism, unspecified: Secondary | ICD-10-CM | POA: Diagnosis not present

## 2024-02-06 DIAGNOSIS — I5022 Chronic systolic (congestive) heart failure: Secondary | ICD-10-CM | POA: Diagnosis not present

## 2024-03-03 DIAGNOSIS — H35373 Puckering of macula, bilateral: Secondary | ICD-10-CM | POA: Diagnosis not present

## 2024-03-03 DIAGNOSIS — H43813 Vitreous degeneration, bilateral: Secondary | ICD-10-CM | POA: Diagnosis not present

## 2024-03-03 DIAGNOSIS — Z961 Presence of intraocular lens: Secondary | ICD-10-CM | POA: Diagnosis not present

## 2024-08-07 ENCOUNTER — Encounter: Admitting: Nurse Practitioner
